# Patient Record
Sex: Female | Born: 1935 | Race: White | Hispanic: No | Marital: Married | Smoking: Former smoker
Health system: Southern US, Community
[De-identification: ages and names within clinical notes are randomized; demographics above are authoritative.]

## PROBLEM LIST (undated history)

## (undated) DIAGNOSIS — G309 Alzheimer's disease, unspecified: Secondary | ICD-10-CM

## (undated) DIAGNOSIS — I10 Essential (primary) hypertension: Secondary | ICD-10-CM

## (undated) DIAGNOSIS — R519 Headache, unspecified: Secondary | ICD-10-CM

## (undated) DIAGNOSIS — F0280 Dementia in other diseases classified elsewhere without behavioral disturbance: Secondary | ICD-10-CM

## (undated) DIAGNOSIS — F419 Anxiety disorder, unspecified: Secondary | ICD-10-CM

## (undated) DIAGNOSIS — R51 Headache: Secondary | ICD-10-CM

## (undated) HISTORY — PX: BREAST BIOPSY: SHX20

## (undated) HISTORY — DX: Anxiety disorder, unspecified: F41.9

## (undated) HISTORY — DX: Essential (primary) hypertension: I10

## (undated) HISTORY — DX: Headache, unspecified: R51.9

## (undated) HISTORY — DX: Headache: R51

## (undated) HISTORY — DX: Dementia in other diseases classified elsewhere, unspecified severity, without behavioral disturbance, psychotic disturbance, mood disturbance, and anxiety: F02.80

## (undated) HISTORY — DX: Alzheimer's disease, unspecified: G30.9

---

## 1998-10-18 ENCOUNTER — Other Ambulatory Visit: Admission: RE | Admit: 1998-10-18 | Discharge: 1998-10-18 | Payer: Self-pay | Admitting: Obstetrics & Gynecology

## 1999-10-25 ENCOUNTER — Other Ambulatory Visit: Admission: RE | Admit: 1999-10-25 | Discharge: 1999-10-25 | Payer: Self-pay | Admitting: Obstetrics and Gynecology

## 1999-11-08 ENCOUNTER — Other Ambulatory Visit: Admission: RE | Admit: 1999-11-08 | Discharge: 1999-11-08 | Payer: Self-pay | Admitting: Obstetrics and Gynecology

## 1999-11-08 ENCOUNTER — Encounter (INDEPENDENT_AMBULATORY_CARE_PROVIDER_SITE_OTHER): Payer: Self-pay

## 2000-12-17 ENCOUNTER — Other Ambulatory Visit: Admission: RE | Admit: 2000-12-17 | Discharge: 2000-12-17 | Payer: Self-pay | Admitting: Obstetrics and Gynecology

## 2002-02-03 ENCOUNTER — Other Ambulatory Visit: Admission: RE | Admit: 2002-02-03 | Discharge: 2002-02-03 | Payer: Self-pay | Admitting: Obstetrics and Gynecology

## 2003-08-10 ENCOUNTER — Other Ambulatory Visit: Admission: RE | Admit: 2003-08-10 | Discharge: 2003-08-10 | Payer: Self-pay | Admitting: Obstetrics and Gynecology

## 2005-01-23 ENCOUNTER — Other Ambulatory Visit: Admission: RE | Admit: 2005-01-23 | Discharge: 2005-01-23 | Payer: Self-pay | Admitting: Obstetrics and Gynecology

## 2005-06-06 ENCOUNTER — Ambulatory Visit: Payer: Self-pay | Admitting: Internal Medicine

## 2005-06-16 ENCOUNTER — Ambulatory Visit: Payer: Self-pay | Admitting: Internal Medicine

## 2007-02-04 ENCOUNTER — Encounter: Admission: RE | Admit: 2007-02-04 | Discharge: 2007-02-04 | Payer: Self-pay | Admitting: Family Medicine

## 2010-05-08 ENCOUNTER — Encounter: Payer: Self-pay | Admitting: Internal Medicine

## 2010-09-03 NOTE — Letter (Signed)
Summary: Colonoscopy Letter  [facility]  [address] [place], [place] [address]   Phone: [phone]  Fax: [phone]      May 08, 2010 MRN: [id number]   [facility] [address] [place], [place]  [address]   Dear Ms. Fredericksen,   According to your medical record, it is time for you to schedule a Colonoscopy. The American Cancer Society recommends this procedure as a method to detect early colon cancer. Patients with a family history of colon cancer, or a personal history of colon polyps or inflammatory bowel disease are at increased risk.  This letter has been generated based on the recommendations made at the time of your procedure. If you feel that in your particular situation this may no longer apply, please contact our office.  Please call our office at [phone] to schedule this appointment or to update your records at your earliest convenience.  Thank you for cooperating with [place] to provide you with the very best care possible.   Sincerely,  Wilhemina Bonito. Marina Goodell, M.D.  [facility] [phone]

## 2010-09-05 ENCOUNTER — Encounter (INDEPENDENT_AMBULATORY_CARE_PROVIDER_SITE_OTHER): Payer: Self-pay | Admitting: *Deleted

## 2010-09-11 NOTE — Letter (Signed)
Summary: Pre Visit Letter Revised  [facility]  [address] [place], [place] [address]   Phone: [phone]  Fax: [phone]        09/05/2010 MRN: [id number] [facility] [address] [place], [place]  [address]             Procedure Date:  October 07, 2010   recall col-Dr Justin Mend to the Gastroenterology Division at [facility].    You are scheduled to see a nurse for your pre-procedure visit on September 23, 2010 at 10:30am on the 3rd floor at [facility], [address]  We ask that you try to arrive at our office 15 minutes prior to your appointment time to allow for check-in.  Please take a minute to review the attached form.  If you answer "Yes" to one or more of the questions on the first page, we ask that you call the person listed at your earliest opportunity.  If you answer "No" to all of the questions, please complete the rest of the form and bring it to your appointment.    Your nurse visit will consist of discussing your medical and surgical history, your immediate family medical history, and your medications.   If you are unable to list all of your medications on the form, please bring the medication bottles to your appointment and we will list them.  We will need to be aware of both prescribed and over the counter drugs.  We will need to know exact dosage information as well.    Please be prepared to read and sign documents such as consent forms, a financial agreement, and acknowledgement forms.  If necessary, and with your consent, a friend or relative is welcome to sit-in on the nurse visit with you.  Please bring your insurance card so that we may make a copy of it.  If your insurance requires a referral to see a specialist, please bring your referral form from your primary care physician.  No co-pay is required for this nurse visit.     If you cannot keep your appointment, please call [phone] to cancel or reschedule prior  to your appointment date.  This allows [place] the opportunity to schedule an appointment for another patient in need of care.    Thank you for choosing [facility] for your medical needs.  We appreciate the opportunity to care for you.  Please visit [place] at our website  to learn more about our practice.  Sincerely, The Gastroenterology Division

## 2010-09-19 ENCOUNTER — Encounter (INDEPENDENT_AMBULATORY_CARE_PROVIDER_SITE_OTHER): Payer: Self-pay | Admitting: *Deleted

## 2010-09-23 ENCOUNTER — Encounter: Payer: Self-pay | Admitting: Internal Medicine

## 2010-10-01 NOTE — Miscellaneous (Signed)
Summary: [facility] Previsit/prep  Clinical Lists Changes  Medications: Added new medication of MOVIPREP 100 GM  SOLR (PEG-KCL-NACL-NASULF-NA ASC-C) As per prep instructions. - Signed Rx of MOVIPREP 100 GM  SOLR (PEG-KCL-NACL-NASULF-NA ASC-C) As per prep instructions.;  #1 x 0;  Signed;  Entered by: Wyona Almas RN;  Authorized by: Hilarie Fredrickson MD;  Method used: [facility] to [facility]  [phone]*, [address] [place], [place]  [address], Ph: [phone] or [phone], Fax: [phone] Allergies: Added new allergy or adverse reaction of SULFA Observations: Added new observation of NKA: F (09/23/2010 10:34)    Prescriptions: MOVIPREP 100 GM  SOLR (PEG-KCL-NACL-NASULF-NA ASC-C) As per prep instructions.  #1 x 0   Entered by:   Wyona Almas RN   Authorized by:   Hilarie Fredrickson MD   Signed by:   Wyona Almas RN on 09/23/2010   Method used:   [facility]ronically to        [facility]  [phone]* (retail)       [address] [place], [place]  [address]       Ph: [phone] or [phone]       Fax: [phone]   RxID:   [id number]

## 2010-10-01 NOTE — Letter (Signed)
Summary: Moviprep Instructions  [facility]  [address]   [place], [place] [address]   Phone: [phone]  Fax: [phone]       Kathryn [name]    12/29/1933    MRN: [id number]        Procedure Day /Date: Monday, 10-07-10     Arrival Time: 8:30 a.m.     Procedure Time: 9:30 a.m.     Location of Procedure:                    x  [facility] (4th Floor)                        PREPARATION FOR COLONOSCOPY WITH MOVIPREP   Starting 5 days prior to your procedure 10-02-10 do not eat nuts, seeds, popcorn, corn, beans, peas,  salads, or any raw vegetables.  Do not take any fiber supplements (e.g. Metamucil, Citrucel, and Benefiber).  THE DAY BEFORE YOUR PROCEDURE         DATE: 10-06-10  DAY: Sunday  1.  Drink clear liquids the entire day-NO SOLID FOOD  2.  Do not drink anything colored red or purple.  Avoid juices with pulp.  No orange juice.  3.  Drink at least 64 oz. (8 glasses) of fluid/clear liquids during the day to prevent dehydration and help the prep work efficiently.  CLEAR LIQUIDS INCLUDE: Water Jello Ice Popsicles Tea (sugar ok, no milk/cream) Powdered fruit flavored drinks Coffee (sugar ok, no milk/cream) Gatorade Juice: apple, white grape, white cranberry  Lemonade Clear bullion, consomm, broth Carbonated beverages (any kind) Strained chicken noodle soup Hard Candy                             4.  In the morning, mix first dose of MoviPrep solution:    Empty 1 Pouch A and 1 Pouch B into the disposable container    Add lukewarm drinking water to the top line of the container. Mix to dissolve    Refrigerate (mixed solution should be used within 24 hrs)  5.  Begin drinking the prep at 5:00 p.m. The MoviPrep container is divided by 4 marks.   Every 15 minutes drink the solution down to the next mark (approximately 8 oz) until the full liter is complete.   6.  Follow completed prep with 16 oz of clear liquid of your choice (Nothing  red or purple).  Continue to drink clear liquids until bedtime.  7.  Before going to bed, mix second dose of MoviPrep solution:    Empty 1 Pouch A and 1 Pouch B into the disposable container    Add lukewarm drinking water to the top line of the container. Mix to dissolve    Refrigerate  THE DAY OF YOUR PROCEDURE      DATE: 10-07-10  DAY: Monday  Beginning at 4:30 a.m. (5 hours before procedure):         1. Every 15 minutes, drink the solution down to the next mark (approx 8 oz) until the full liter is complete.  2. Follow completed prep with 16 oz. of clear liquid of your choice.    3. You may drink clear liquids until 7:30 a.m. (2 HOURS BEFORE PROCEDURE).   MEDICATION INSTRUCTIONS  Unless otherwise instructed, you should take regular prescription medications with a small sip of water   as early as possible the morning of  your procedure.      OTHER INSTRUCTIONS  You will need a responsible adult at least 75 years of age to accompany you and drive you home.   This person must remain in the waiting room during your procedure.  Wear loose fitting clothing that is easily removed.  Leave jewelry and other valuables at home.  However, you may wish to bring a book to read or  an iPod/MP3 player to listen to music as you wait for your procedure to start.  Remove all body piercing jewelry and leave at home.  Total time from sign-in until discharge is approximately 2-3 hours.  You should go home directly after your procedure and rest.  You can resume normal activities the  day after your procedure.  The day of your procedure you should not:   Drive   Make legal decisions   Operate machinery   Drink alcohol   Return to work  You will receive specific instructions about eating, activities and medications before you leave.    The above instructions have been reviewed and explained to me by   Wyona Almas RN  September 23, 2010 11:02 AM     I fully understand and  can verbalize these instructions _____________________________ Date _________

## 2010-10-07 ENCOUNTER — Other Ambulatory Visit: Payer: Self-pay | Admitting: Internal Medicine

## 2010-10-07 ENCOUNTER — Other Ambulatory Visit (AMBULATORY_SURGERY_CENTER): Payer: Medicare Other | Admitting: Internal Medicine

## 2010-10-07 DIAGNOSIS — Z860100 Personal history of colon polyps, unspecified: Secondary | ICD-10-CM

## 2010-10-07 DIAGNOSIS — D126 Benign neoplasm of colon, unspecified: Secondary | ICD-10-CM

## 2010-10-07 DIAGNOSIS — Z1211 Encounter for screening for malignant neoplasm of colon: Secondary | ICD-10-CM

## 2010-10-07 DIAGNOSIS — K5730 Diverticulosis of large intestine without perforation or abscess without bleeding: Secondary | ICD-10-CM

## 2010-10-10 ENCOUNTER — Encounter: Payer: Self-pay | Admitting: Internal Medicine

## 2010-10-15 NOTE — Letter (Signed)
Summary: Patient Notice- Polyp Results  [facility]  [address] [place], [place] [address]   Phone: [phone]  Fax: [phone]        October 10, 2010 MRN: [id number]    [facility] [address] [place], [place]  [address]    Dear Ms. Rote,  I am pleased to inform you that the colon polyp(s) removed during your recent colonoscopy was (were) found to be benign (no cancer detected) upon pathologic examination.   Additional information/recommendations:  __ No further action with gastroenterology is needed at this time. Please      follow-up with your primary care physician for your other healthcare      needs.    Please call us if you are having persistent problems or have questions about your condition that have not been fully answered at this time.  Sincerely,  Hilarie Fredrickson MD  This letter has been electronically signed by your physician.  Appended Document: Patient Notice- Polyp Results letter mailed

## 2010-10-15 NOTE — Procedures (Addendum)
Summary: Colonoscopy  Patient: [name] Note: All result statuses are Final unless otherwise noted.  Tests: (1) Colonoscopy (COL)   COL Colonoscopy           DONE     [facility]     [address]     [place], [place]  [address]          COLONOSCOPY PROCEDURE REPORT          PATIENT:  Kathryn [name], Kathryn [name]  MR#:  [id number]     BIRTHDATE:  1934-06-20, 76 yrs. old  GENDER:  female     ENDOSCOPIST:  Wilhemina Bonito. Eda Keys, MD     REF. BY:  Surveillance Program Recall,     PROCEDURE DATE:  10/07/2010     PROCEDURE:  Colonoscopy with snare polypectomy x 1     ASA CLASS:  Class I     INDICATIONS:  history of polyps, surveillance and high-risk     screening ; index exam 06-2005 w/ adenomatous appearring polyp     (NAP)     MEDICATIONS:   Fentanyl 75 mcg IV, Versed 6 mg IV          DESCRIPTION OF PROCEDURE:   After the risks benefits and     alternatives of the procedure were thoroughly explained, informed     consent was obtained.  Digital rectal exam was performed and     revealed no abnormalities.   The LB CF-[id number] endoscope     was introduced through the anus and advanced to the cecum, which     was identified by both the appendix and ileocecal valve, without     limitations.Time to cecum = 4:00 min. The quality of the prep was     good, using MoviPrep.  The instrument was then slowly withdrawn     (time = 11:46 min) as the colon was fully examined.     <<PROCEDUREIMAGES>>          FINDINGS:  A diminutive polyp was found in the ascending colon.     Polyp was snared without cautery. Retrieval was successful.A few     Scattered diverticula were found.  Otherwise normal colonoscopy     without other polyps, masses, vascular ectasias, or inflammatory     changes.   Retroflexed views in the rectum revealed no     abnormalities.    The scope was then withdrawn from the patient     and the procedure completed.          COMPLICATIONS:  None     ENDOSCOPIC  IMPRESSION:     1) Diminutive polyp in the ascending colon - removed     2) Diverticula, a few scattered     3) Otherwise normal colonoscopy          RECOMMENDATIONS:     1) Return to the care of your primary provider. GI follow up as     needed          ______________________________     Wilhemina Bonito. Eda Keys, MD          CC:  Huel Cote, MD; The Patient          n.     eSIGNED:   Wilhemina Bonito. Eda Keys at 10/07/2010 10:09 AM          Liborio Nixon, [id number]  Note: An exclamation mark (!) indicates a result that was not dispersed into the flowsheet. Document Creation Date:  10/07/2010 10:09 AM _______________________________________________________________________  (1) Order result status: Final Collection or observation date-time: 10/07/2010 10:03 Requested date-time:  Receipt date-time:  Reported date-time:  Referring Physician:   Ordering Physician: Fransico Setters [phone]) Specimen Source:  Source: Launa Grill Order Number: [id number] Lab site:

## 2013-12-13 ENCOUNTER — Ambulatory Visit (INDEPENDENT_AMBULATORY_CARE_PROVIDER_SITE_OTHER): Payer: Medicare Other | Admitting: Family

## 2013-12-13 ENCOUNTER — Encounter: Payer: Self-pay | Admitting: Family

## 2013-12-13 VITALS — BP 128/60 | HR 88 | Temp 99.6°F | Ht 62.25 in | Wt 110.0 lb

## 2013-12-13 DIAGNOSIS — K5900 Constipation, unspecified: Secondary | ICD-10-CM

## 2013-12-13 NOTE — Patient Instructions (Signed)

## 2013-12-13 NOTE — Progress Notes (Signed)
Pre visit review using our clinic review tool, if applicable. No additional management support is needed unless otherwise documented below in the visit note. 

## 2013-12-13 NOTE — Progress Notes (Signed)
   Subjective:    Patient ID: Kathryn [name], female    DOB: 02/16/1934, 80 y.o.   MRN: [id number]  HPI  80 year old caucasian female, nonsmoker presenting today to be established. She has no health and does not take any medications  She is concerned at times that she does not have incomplete bowel movements.  She has noticed that sweet potatoes and prunes make her go but on other days she only goes a little. Denies any blood in her stools or dark black stool.     Review of Systems  Constitutional: Negative.   HENT: Negative.   Eyes: Negative.   Respiratory: Negative.   Cardiovascular: Negative.   Gastrointestinal: Positive for constipation.  Endocrine: Negative.   Genitourinary: Negative.   Musculoskeletal: Negative.   Skin: Negative.   Allergic/Immunologic: Negative.   Neurological: Negative.   Hematological: Negative.   Psychiatric/Behavioral: Negative.    History reviewed. No pertinent past medical history.  History   Social History  . Marital Status: Married    Spouse Name: N/A    Number of Children: N/A  . Years of Education: N/A   Occupational History  . Not on file.   Social History Main Topics  . Smoking status: Former Research scientist (life sciences)  . Smokeless tobacco: Not on file  . Alcohol Use: Yes     Comment: wine  . Drug Use: No  . Sexual Activity: Not on file   Other Topics Concern  . Not on file   Social History Narrative  . No narrative on file    Past Surgical History  Procedure Laterality Date  . Breast biopsy      No family history on file.  Allergies  Allergen Reactions  . Sulfonamide Derivatives     REACTION: Swollen joints    No current outpatient prescriptions on file prior to visit.   No current facility-administered medications on file prior to visit.    BP 128/60  Pulse 88  Temp(Src) 99.6 F (37.6 C) (Oral)  Ht 5' 2.25" (1.581 m)  Wt 110 lb (49.896 kg)  BMI 19.96 kg/m2  SpO2 99%chart    Objective:   Physical Exam  Constitutional:  She is oriented to person, place, and time. She appears well-developed and well-nourished.  HENT:  Head: Normocephalic and atraumatic.  Right Ear: External ear normal.  Left Ear: External ear normal.  Nose: Nose normal.  Mouth/Throat: Oropharynx is clear and moist.  Eyes: Conjunctivae and EOM are normal. Pupils are equal, round, and reactive to light.  Neck: Normal range of motion. Neck supple.  Cardiovascular: Normal rate, regular rhythm and normal heart sounds.   Pulmonary/Chest: Effort normal and breath sounds normal.  Abdominal: Soft. Bowel sounds are normal.  Musculoskeletal: Normal range of motion.  Neurological: She is alert and oriented to person, place, and time. She has normal reflexes.  Skin: Skin is warm and dry.  Psychiatric: She has a normal mood and affect.          Assessment & Plan:  Assessment 1. Constipation  Plan 1. Increase fiber. 2. Schedule physical in two months. 3. Contact office for questions and concerns.

## 2013-12-14 ENCOUNTER — Encounter: Payer: Self-pay | Admitting: Family

## 2014-03-15 ENCOUNTER — Ambulatory Visit (INDEPENDENT_AMBULATORY_CARE_PROVIDER_SITE_OTHER): Payer: Medicare Other | Admitting: Family

## 2014-03-15 ENCOUNTER — Encounter: Payer: Self-pay | Admitting: Family

## 2014-03-15 VITALS — BP 122/82 | HR 92 | Ht 62.5 in | Wt 111.0 lb

## 2014-03-15 DIAGNOSIS — Z23 Encounter for immunization: Secondary | ICD-10-CM

## 2014-03-15 DIAGNOSIS — Z136 Encounter for screening for cardiovascular disorders: Secondary | ICD-10-CM

## 2014-03-15 DIAGNOSIS — Z0000 Encounter for general adult medical examination without abnormal findings: Secondary | ICD-10-CM

## 2014-03-15 LAB — POCT URINALYSIS DIPSTICK
BILIRUBIN UA: NEGATIVE
Glucose, UA: NEGATIVE
KETONES UA: NEGATIVE
LEUKOCYTES UA: NEGATIVE
Nitrite, UA: NEGATIVE
PH UA: 7
PROTEIN UA: NEGATIVE
RBC UA: NEGATIVE
SPEC GRAV UA: 1.01
Urobilinogen, UA: 0.2

## 2014-03-15 LAB — CBC WITH DIFFERENTIAL/PLATELET
BASOS PCT: 0.5 % (ref 0.0–3.0)
Basophils Absolute: 0 10*3/uL (ref 0.0–0.1)
EOS ABS: 0.3 10*3/uL (ref 0.0–0.7)
Eosinophils Relative: 4.6 % (ref 0.0–5.0)
HCT: 41.8 % (ref 36.0–46.0)
Hemoglobin: 14.2 g/dL (ref 12.0–15.0)
LYMPHS PCT: 27.1 % (ref 12.0–46.0)
Lymphs Abs: 1.6 10*3/uL (ref 0.7–4.0)
MCHC: 34 g/dL (ref 30.0–36.0)
MCV: 94.9 fl (ref 78.0–100.0)
MONO ABS: 0.4 10*3/uL (ref 0.1–1.0)
Monocytes Relative: 7.3 % (ref 3.0–12.0)
NEUTROS PCT: 60.5 % (ref 43.0–77.0)
Neutro Abs: 3.6 10*3/uL (ref 1.4–7.7)
PLATELETS: 288 10*3/uL (ref 150.0–400.0)
RBC: 4.41 Mil/uL (ref 3.87–5.11)
RDW: 13.1 % (ref 11.5–15.5)
WBC: 5.9 10*3/uL (ref 4.0–10.5)

## 2014-03-15 LAB — BASIC METABOLIC PANEL
BUN: 18 mg/dL (ref 6–23)
CHLORIDE: 104 meq/L (ref 96–112)
CO2: 31 mEq/L (ref 19–32)
Calcium: 9.3 mg/dL (ref 8.4–10.5)
Creatinine, Ser: 1 mg/dL (ref 0.4–1.2)
GFR: 57.66 mL/min — AB (ref >60.00)
Glucose, Bld: 88 mg/dL (ref 70–99)
POTASSIUM: 3.9 meq/L (ref 3.5–5.1)
SODIUM: 141 meq/L (ref 135–145)

## 2014-03-15 LAB — HEPATIC FUNCTION PANEL
ALK PHOS: 52 U/L (ref 39–117)
ALT: 11 U/L (ref 0–35)
AST: 16 U/L (ref 0–37)
Albumin: 4.2 g/dL (ref 3.5–5.2)
BILIRUBIN DIRECT: 0.2 mg/dL (ref 0.0–0.3)
BILIRUBIN TOTAL: 1.6 mg/dL — AB (ref 0.2–1.2)
Total Protein: 6.8 g/dL (ref 6.0–8.3)

## 2014-03-15 LAB — LIPID PANEL
CHOL/HDL RATIO: 2
Cholesterol: 212 mg/dL — ABNORMAL HIGH (ref 0–200)
HDL: 87.8 mg/dL (ref >39.00)
LDL CALC: 112 mg/dL — AB (ref 0–99)
NONHDL: 124.2
Triglycerides: 63 mg/dL (ref 0.0–149.0)
VLDL: 12.6 mg/dL (ref 0.0–40.0)

## 2014-03-15 NOTE — Progress Notes (Signed)
Subjective:    Patient ID: Kathryn [name], female    DOB: [birth year], 80 y.o.   MRN: [id number]  HPI 80 year old white female, nonsmoker, is in today for a CPX.  I reviewed all health maintenance protocols including mammography, colonoscopy, bone density Needed referrals were placed. Age and diagnosis  appropriate screening labs were ordered. Her immunization history was reviewed and appropriate vaccinations were ordered. Her current medications and allergies were reviewed and needed refills of her chronic medications were ordered. The plan for yearly health maintenance was discussed all orders and referrals were made as appropriate.   Review of Systems  Constitutional: Negative.   HENT: Negative.   Eyes: Negative.   Respiratory: Negative.   Cardiovascular: Negative.   Gastrointestinal: Negative.   Endocrine: Negative.   Genitourinary: Negative.   Musculoskeletal: Negative.   Skin: Negative.   Allergic/Immunologic: Negative.   Neurological: Negative.   Hematological: Negative.   Psychiatric/Behavioral: Negative.    History reviewed. No pertinent past medical history.  History   Social History  . Marital Status: Married    Spouse Name: N/A    Number of Children: N/A  . Years of Education: N/A   Occupational History  . Not on file.   Social History Main Topics  . Smoking status: Former Research scientist (life sciences)  . Smokeless tobacco: Not on file  . Alcohol Use: Yes     Comment: wine  . Drug Use: No  . Sexual Activity: Not on file   Other Topics Concern  . Not on file   Social History Narrative  . No narrative on file    Past Surgical History  Procedure Laterality Date  . Breast biopsy      No family history on file.  Allergies  Allergen Reactions  . Sulfonamide Derivatives     REACTION: Swollen joints    Current Outpatient Prescriptions on File Prior to Visit  Medication Sig Dispense Refill  . BIOTIN 5000 PO Take by mouth.      . calcium carbonate (OS-CAL) 600 MG TABS  tablet Take 600 mg by mouth daily.      . Cholecalciferol (VITAMIN D3) 2000 UNITS TABS Take by mouth.      . Coenzyme Q10 (COQ10 PO) Take by mouth.      . cyanocobalamin 100 MCG tablet Take 100 mcg by mouth daily.      . folic acid (FOLVITE) 948 MCG tablet Take 400 mcg by mouth daily.      . Grape Seed Extract 100 MG CAPS Take by mouth.      . magnesium gluconate (MAGONATE) 500 MG tablet Take 500 mg by mouth daily.      . NON FORMULARY MK-7 90 meq      . Omega-3 Fatty Acids (FISH OIL) 1000 MG CAPS Take by mouth.      . Potassium Gluconate 550 MG TABS Take by mouth.      . pyridoxine (B-6) 100 MG tablet Take 100 mg by mouth daily.      Marland Kitchen zinc gluconate 50 MG tablet Take 50 mg by mouth daily.       No current facility-administered medications on file prior to visit.    BP 122/82  Pulse 92  Ht 5' 2.5" (1.588 m)  Wt 111 lb (50.349 kg)  BMI 19.97 kg/m2chart    Objective:   Physical Exam  Constitutional: She is oriented to person, place, and time. She appears well-developed and well-nourished.  HENT:  Head: Normocephalic and atraumatic.  Right Ear: External ear normal.  Left Ear: External ear normal.  Nose: Nose normal.  Mouth/Throat: Oropharynx is clear and moist.  Eyes: Conjunctivae and EOM are normal. Pupils are equal, round, and reactive to light.  Neck: Normal range of motion. Neck supple. No thyromegaly present.  Cardiovascular: Normal rate, regular rhythm and normal heart sounds.   Pulmonary/Chest: Effort normal and breath sounds normal.  Abdominal: Soft. Bowel sounds are normal.  Musculoskeletal: Normal range of motion. She exhibits no edema and no tenderness.  Neurological: She is alert and oriented to person, place, and time. She has normal reflexes. She displays normal reflexes. No cranial nerve deficit. Coordination normal.  Skin: Skin is warm and dry.  Psychiatric: She has a normal mood and affect.          Assessment & Plan:  [name] was seen today for annual  exam.  Diagnoses and associated orders for this visit:  Preventative health care - CBC with Differential - Basic Metabolic Panel - Hepatic Function Panel - POCT urinalysis dipstick - Lipid Panel - EKG 12-Lead  Need for prophylactic vaccination with tetanus toxoid alone - Td vaccine preservative free greater than or equal to 7yo IM  Need for prophylactic vaccination against Streptococcus pneumoniae (pneumococcus) - Pneumococcal conjugate vaccine 13-valent   Call the office with any questions or concerns. Recheck in 1 year and sooner as needed.

## 2014-03-15 NOTE — Progress Notes (Signed)
Pre visit review using our clinic review tool, if applicable. No additional management support is needed unless otherwise documented below in the visit note. 

## 2014-03-15 NOTE — Patient Instructions (Signed)
Cardiac Diet This diet can help prevent heart disease and stroke. Many factors influence your heart health, including eating and exercise habits. Coronary risk rises a lot with abnormal blood fat (lipid) levels. Cardiac meal planning includes limiting unhealthy fats, increasing healthy fats, and making other small dietary changes. General guidelines are as follows:  Adjust calorie intake to reach and maintain desirable body weight.  Limit total fat intake to less than 30% of total calories. Saturated fat should be less than 7% of calories.  Saturated fats are found in animal products and in some vegetable products. Saturated vegetable fats are found in coconut oil, cocoa butter, palm oil, and palm kernel oil. Read labels carefully to avoid these products as much as possible. Use butter in moderation. Choose tub margarines and oils that have 2 grams of fat or less. Good cooking oils are canola and olive oils.  Practice low-fat cooking techniques. Do not fry food. Instead, broil, bake, boil, steam, grill, roast on a rack, stir-fry, or microwave it. Other fat reducing suggestions include:  Remove the skin from poultry.  Remove all visible fat from meats.  Skim the fat off stews, soups, and gravies before serving them.  Steam vegetables in water or broth instead of sauting them in fat.  Avoid foods with trans fat (or hydrogenated oils), such as commercially fried foods and commercially baked goods. Commercial shortening and deep-frying fats will contain trans fat.  Increase intake of fruits, vegetables, whole grains, and legumes to replace foods high in fat.  Increase consumption of nuts, legumes, and seeds to at least 4 servings weekly. One serving of a legume equals  cup, and 1 serving of nuts or seeds equals  cup.  Choose whole grains more often. Have 3 servings per day (a serving is 1 ounce [oz]).  Eat 4 to 5 servings of vegetables per day. A serving of vegetables is 1 cup of raw leafy  vegetables;  cup of raw or cooked cut-up vegetables;  cup of vegetable juice.  Eat 4 to 5 servings of fruit per day. A serving of fruit is 1 medium whole fruit;  cup of dried fruit;  cup of fresh, frozen, or canned fruit;  cup of 100% fruit juice.  Increase your intake of dietary fiber to 20 to 30 grams per day. Insoluble fiber may help lower your risk of heart disease and may help curb your appetite.  Soluble fiber binds cholesterol to be removed from the blood. Foods high in soluble fiber are dried beans, citrus fruits, oats, apples, bananas, broccoli, Brussels sprouts, and eggplant.  Try to include foods fortified with plant sterols or stanols, such as yogurt, breads, juices, or margarines. Choose several fortified foods to achieve a daily intake of 2 to 3 grams of plant sterols or stanols.  Foods with omega-3 fats can help reduce your risk of heart disease. Aim to have a 3.5 oz portion of fatty fish twice per week, such as salmon, mackerel, albacore tuna, sardines, lake trout, or herring. If you wish to take a fish oil supplement, choose one that contains 1 gram of both DHA and EPA.  Limit processed meats to 2 servings (3 oz portion) weekly.  Limit the sodium in your diet to 1500 milligrams (mg) per day. If you have high blood pressure, talk to a registered dietitian about a DASH (Dietary Approaches to Stop Hypertension) eating plan.  Limit sweets and beverages with added sugar, such as soda, to no more than 5 servings per week. One   serving is:   1 tablespoon sugar.  1 tablespoon jelly or jam.   cup sorbet.  1 cup lemonade.   cup regular soda. CHOOSING FOODS Starches  Allowed: Breads: All kinds (wheat, rye, raisin, white, oatmeal, Italian, French, and English muffin bread). Low-fat rolls: English muffins, frankfurter and hamburger buns, bagels, pita bread, tortillas (not fried). Pancakes, waffles, biscuits, and muffins made with recommended oil.  Avoid: Products made with  saturated or trans fats, oils, or whole milk products. Butter rolls, cheese breads, croissants. Commercial doughnuts, muffins, sweet rolls, biscuits, waffles, pancakes, store-bought mixes. Crackers  Allowed: Low-fat crackers and snacks: Animal, graham, rye, saltine (with recommended oil, no lard), oyster, and matzo crackers. Bread sticks, melba toast, rusks, flatbread, pretzels, and light popcorn.  Avoid: High-fat crackers: cheese crackers, butter crackers, and those made with coconut, palm oil, or trans fat (hydrogenated oils). Buttered popcorn. Cereals  Allowed: Hot or cold whole-grain cereals.  Avoid: Cereals containing coconut, hydrogenated vegetable fat, or animal fat. Potatoes / Pasta / Rice  Allowed: All kinds of potatoes, rice, and pasta (such as macaroni, spaghetti, and noodles).  Avoid: Pasta or rice prepared with cream sauce or high-fat cheese. Chow mein noodles, French fries. Vegetables  Allowed: All vegetables and vegetable juices.  Avoid: Fried vegetables. Vegetables in cream, butter, or high-fat cheese sauces. Limit coconut. Fruit in cream or custard. Protein  Allowed: Limit your intake of meat, seafood, and poultry to no more than 6 oz (cooked weight) per day. All lean, well-trimmed beef, veal, pork, and lamb. All chicken and turkey without skin. All fish and shellfish. Wild game: wild duck, rabbit, pheasant, and venison. Egg whites or low-cholesterol egg substitutes may be used as desired. Meatless dishes: recipes with dried beans, peas, lentils, and tofu (soybean curd). Seeds and nuts: all seeds and most nuts.  Avoid: Prime grade and other heavily marbled and fatty meats, such as short ribs, spare ribs, rib eye roast or steak, frankfurters, sausage, bacon, and high-fat luncheon meats, mutton. Caviar. Commercially fried fish. Domestic duck, goose, venison sausage. Organ meats: liver, gizzard, heart, chitterlings, brains, kidney, sweetbreads. Dairy  Allowed: Low-fat  cheeses: nonfat or low-fat cottage cheese (1% or 2% fat), cheeses made with part skim milk, such as mozzarella, farmers, string, or ricotta. (Cheeses should be labeled no more than 2 to 6 grams fat per oz.). Skim (or 1%) milk: liquid, powdered, or evaporated. Buttermilk made with low-fat milk. Drinks made with skim or low-fat milk or cocoa. Chocolate milk or cocoa made with skim or low-fat (1%) milk. Nonfat or low-fat yogurt.  Avoid: Whole milk cheeses, including colby, cheddar, muenster, Monterey Jack, Havarti, Brie, Camembert, American, Swiss, and blue. Creamed cottage cheese, cream cheese. Whole milk and whole milk products, including buttermilk or yogurt made from whole milk, drinks made from whole milk. Condensed milk, evaporated whole milk, and 2% milk. Soups and Combination Foods  Allowed: Low-fat low-sodium soups: broth, dehydrated soups, homemade broth, soups with the fat removed, homemade cream soups made with skim or low-fat milk. Low-fat spaghetti, lasagna, chili, and Spanish rice if low-fat ingredients and low-fat cooking techniques are used.  Avoid: Cream soups made with whole milk, cream, or high-fat cheese. All other soups. Desserts and Sweets  Allowed: Sherbet, fruit ices, gelatins, meringues, and angel food cake. Homemade desserts with recommended fats, oils, and milk products. Jam, jelly, honey, marmalade, sugars, and syrups. Pure sugar candy, such as gum drops, hard candy, jelly beans, marshmallows, mints, and small amounts of dark chocolate.  Avoid: Commercially prepared   cakes, pies, cookies, frosting, pudding, or mixes for these products. Desserts containing whole milk products, chocolate, coconut, lard, palm oil, or palm kernel oil. Ice cream or ice cream drinks. Candy that contains chocolate, coconut, butter, hydrogenated fat, or unknown ingredients. Buttered syrups. Fats and Oils  Allowed: Vegetable oils: safflower, sunflower, corn, soybean, cottonseed, sesame, canola, olive,  or peanut. Non-hydrogenated margarines. Salad dressing or mayonnaise: homemade or commercial, made with a recommended oil. Low or nonfat salad dressing or mayonnaise.  Limit added fats and oils to 6 to 8 tsp per day (includes fats used in cooking, baking, salads, and spreads on bread). Remember to count the "hidden fats" in foods.  Avoid: Solid fats and shortenings: butter, lard, salt pork, bacon drippings. Gravy containing meat fat, shortening, or suet. Cocoa butter, coconut. Coconut oil, palm oil, palm kernel oil, or hydrogenated oils: these ingredients are often used in bakery products, nondairy creamers, whipped toppings, candy, and commercially fried foods. Read labels carefully. Salad dressings made of unknown oils, sour cream, or cheese, such as blue cheese and Roquefort. Cream, all kinds: half-and-half, light, heavy, or whipping. Sour cream or cream cheese (even if "light" or low-fat). Nondairy cream substitutes: coffee creamers and sour cream substitutes made with palm, palm kernel, hydrogenated oils, or coconut oil. Beverages  Allowed: Coffee (regular or decaffeinated), tea. Diet carbonated beverages, mineral water. Alcohol: Check with your caregiver. Moderation is recommended.  Avoid: Whole milk, regular sodas, and juice drinks with added sugar. Condiments  Allowed: All seasonings and condiments. Cocoa powder. "Cream" sauces made with recommended ingredients.  Avoid: Carob powder made with hydrogenated fats. SAMPLE MENU Breakfast   cup orange juice   cup oatmeal  1 slice toast  1 tsp margarine  1 cup skim milk Lunch  Turkey sandwich with 2 oz turkey, 2 slices bread  Lettuce and tomato slices  Fresh fruit  Carrot sticks  Coffee or tea Snack  Fresh fruit or low-fat crackers Dinner  3 oz lean ground beef  1 baked potato  1 tsp margarine   cup asparagus  Lettuce salad  1 tbs non-creamy dressing   cup peach slices  1 cup skim milk Document Released:  04/29/2008 Document Revised: 01/20/2012 Document Reviewed: 09/20/2013 Exit[employer] Patient Information 2015 [employer]. This information is not intended to replace advice given to you by your health care provider. Make sure you discuss any questions you have with your health care provider.  

## 2015-01-15 DIAGNOSIS — M5414 Radiculopathy, thoracic region: Secondary | ICD-10-CM | POA: Diagnosis not present

## 2015-01-15 DIAGNOSIS — M9904 Segmental and somatic dysfunction of sacral region: Secondary | ICD-10-CM | POA: Diagnosis not present

## 2015-01-15 DIAGNOSIS — M9905 Segmental and somatic dysfunction of pelvic region: Secondary | ICD-10-CM | POA: Diagnosis not present

## 2015-01-15 DIAGNOSIS — M9903 Segmental and somatic dysfunction of lumbar region: Secondary | ICD-10-CM | POA: Diagnosis not present

## 2015-01-15 DIAGNOSIS — M5136 Other intervertebral disc degeneration, lumbar region: Secondary | ICD-10-CM | POA: Diagnosis not present

## 2015-05-28 ENCOUNTER — Encounter: Payer: Self-pay | Admitting: Neurology

## 2015-05-28 ENCOUNTER — Ambulatory Visit (INDEPENDENT_AMBULATORY_CARE_PROVIDER_SITE_OTHER): Payer: Medicare Other | Admitting: Neurology

## 2015-05-28 VITALS — BP 156/71 | HR 76 | Ht 62.5 in | Wt 116.4 lb

## 2015-05-28 DIAGNOSIS — F05 Delirium due to known physiological condition: Secondary | ICD-10-CM

## 2015-05-28 DIAGNOSIS — R413 Other amnesia: Secondary | ICD-10-CM | POA: Diagnosis not present

## 2015-05-28 DIAGNOSIS — E538 Deficiency of other specified B group vitamins: Secondary | ICD-10-CM

## 2015-05-28 DIAGNOSIS — F411 Generalized anxiety disorder: Secondary | ICD-10-CM | POA: Diagnosis not present

## 2015-05-28 DIAGNOSIS — R251 Tremor, unspecified: Secondary | ICD-10-CM | POA: Diagnosis not present

## 2015-05-28 DIAGNOSIS — I10 Essential (primary) hypertension: Secondary | ICD-10-CM | POA: Diagnosis not present

## 2015-05-28 DIAGNOSIS — R410 Disorientation, unspecified: Secondary | ICD-10-CM

## 2015-05-28 MED ORDER — DONEPEZIL HCL 10 MG PO TABS: 10.0000 mg | ORAL_TABLET | Freq: Every day | ORAL | Status: DC

## 2015-05-28 MED ORDER — DONEPEZIL HCL 5 MG PO TABS: 5.0000 mg | ORAL_TABLET | Freq: Every day | ORAL | Status: DC

## 2015-05-28 MED ORDER — MEMANTINE HCL ER 28 MG PO CP24: 28.0000 mg | ORAL_CAPSULE | Freq: Every day | ORAL | Status: DC

## 2015-05-28 NOTE — Progress Notes (Signed)
[facility]    Provider:  Dr [name] Referring Provider: Kennyth Arnold, FNP Primary Care Physician:  Kennyth Arnold, FNP  CC:  Memory changes  HPI:  JOHNANNA [name] is a 81 y.o. female here as a referral from Dr. Justin Mend for memory loss. PMHx anxiety, HTN, fatige, headache. She lives with her husband in a single-family home. Husband is here with her. She functions well at home per patient, she cooks and likes to do yard work. She watches the news dails and enjoys watching movies. Patient says she forgets things at the store. Husband noticed the memory changes which started last October/November when they were dealing with bed bugs.  Husband says directions were difficult for patient, he had to watch patient closely to make sure she followed directions. Previous to this the changes were subtle, she would forget little things. There has been a decline in memory since then. Slowly progressive. She cooks, cleans, she drives. One time the stove was left on but otherwise no accidents in the home or in the car. She doesn't drive far, she doesn't get lost. She knows where to go around her house. She can make dinner fine. One time she forgot to add an ingredient in dinner but that was just an oversight. She is sleeping well. She writes down appointments and keep appointment cards. She has forgotten one appointment a long time ago. Husband does the finances, patient used to do the finances but husband took over because patient couldn't keep up with it. She was forgetting to pay bills. No behavioral changes except sometimes she gets aggravated at herself. No hallucinations or delusions. Mother with Alzheimers at about age 107. Husband is becoming concerned with wife's driving. She goes to yoga every week. No other focal neurologic symptoms. Older memories are intact. More recent memories impaired.   Reviewed notes, labs and imaging from outside physicians, which showed: patient was referred for  evaluation of alzheimer's dementia. CMP unremarkable. TSH wnl.   Review of Systems: Patient complains of symptoms per HPI as well as the following symptoms: No fever, no chills, no CP, no sob. Pertinent negatives per HPI. All others negative.   Social History   Social History  . Marital Status: Married    Spouse Name: [name]   . Number of Children: 3  . Years of Education: 12   Occupational History  . Not on file.   Social History Main Topics  . Smoking status: Former Research scientist (life sciences)  . Smokeless tobacco: Not on file  . Alcohol Use: Yes     Comment: wine  . Drug Use: No  . Sexual Activity: Not on file   Other Topics Concern  . Not on file   Social History Narrative   Lives at home with husband   Caffeine use: Drinks coffee/tea     Family History  Problem Relation Age of Onset  . Thyroid disease Mother   . Alzheimer's disease Mother   . Dementia Mother   . Hearing loss Brother   . Hearing loss Brother   . Heart Problems Brother     Past Medical History  Diagnosis Date  . Headache     Past Surgical History  Procedure Laterality Date  . Breast biopsy      Current Outpatient Prescriptions  Medication Sig Dispense Refill  . BIOTIN 5000 PO Take by mouth.    . calcium carbonate (OS-CAL) 600 MG TABS tablet Take 600 mg by mouth daily.    . Cholecalciferol (  VITAMIN D3) 2000 UNITS TABS Take by mouth.    . Coenzyme Q10 (COQ10 PO) Take by mouth.    . cyanocobalamin 100 MCG tablet Take 100 mcg by mouth daily.    . folic acid (FOLVITE) 496 MCG tablet Take 400 mcg by mouth daily.    . Grape Seed Extract 100 MG CAPS Take by mouth.    . magnesium gluconate (MAGONATE) 500 MG tablet Take 500 mg by mouth daily.    . NON FORMULARY MK-7 90 meq    . Omega-3 Fatty Acids (FISH OIL) 1000 MG CAPS Take by mouth.    . Potassium Gluconate 550 MG TABS Take by mouth.    . pyridoxine (B-6) 100 MG tablet Take 100 mg by mouth daily.    Marland Kitchen zinc gluconate 50 MG tablet Take 50 mg by mouth daily.     Marland Kitchen donepezil (ARICEPT) 10 MG tablet Take 1 tablet (10 mg total) by mouth at bedtime. 30 tablet 11  . donepezil (ARICEPT) 5 MG tablet Take 1 tablet (5 mg total) by mouth at bedtime. 30 tablet 0  . memantine (NAMENDA XR) 28 MG CP24 24 hr capsule Take 1 capsule (28 mg total) by mouth daily. 30 capsule 11   No current facility-administered medications for this visit.    Allergies as of 05/28/2015 - Review Complete 05/28/2015  Allergen Reaction Noted  . Sulfonamide derivatives  09/23/2010    Vitals: BP 156/71 mmHg  Pulse 76  Ht 5' 2.5" (1.588 m)  Wt 116 lb 6.4 oz (52.799 kg)  BMI 20.94 kg/m2 Last Weight:  Wt Readings from Last 1 Encounters:  05/28/15 116 lb 6.4 oz (52.799 kg)   Last Height:   Ht Readings from Last 1 Encounters:  05/28/15 5' 2.5" (1.588 m)    Physical exam: Exam: Gen: NAD, conversant, well nourised, well groomed                     CV: RRR, no MRG. No Carotid Bruits. No peripheral edema, warm, nontender Eyes: Conjunctivae clear without exudates or hemorrhage  Neuro: Detailed Neurologic Exam  Speech:    Speech is normal; fluent and spontaneous with normal comprehension.  Cognition:    Montreal Cognitive Assessment  05/28/2015  Visuospatial/ Executive (0/5) 0  Naming (0/3) 3  Attention: Read list of digits (0/2) 2  Attention: Read list of letters (0/1) 0  Attention: Serial 7 subtraction starting at 100 (0/3) 0  Language: Repeat phrase (0/2) 1  Language : Fluency (0/1) 0  Abstraction (0/2) 1  Delayed Recall (0/5) 0  Orientation (0/6) 6  Total 13  Adjusted Score (based on education) 14     Cranial Nerves:    The pupils are equal, round, and reactive to light. The fundi are normal and spontaneous venous pulsations are present. Visual fields are full to finger confrontation. Extraocular movements are intact. Trigeminal sensation is intact and the muscles of mastication are normal. The face is symmetric. The palate elevates in the midline. Hearing  intact. Voice is normal. Shoulder shrug is normal. The tongue has normal motion without fasciculations.   Coordination:    Normal finger to nose and heel to shin. Normal rapid alternating movements.   Gait:    Heel-toe and tandem gait are normal.   Motor Observation:    No asymmetry, no atrophy, and no involuntary movements noted. Tone:    Normal muscle tone.    Posture:    Posture is normal. normal erect    Strength:  Strength is V/V in the upper and lower limbs.      Sensation: intact to LT     Reflex Exam:  DTR's:    Deep tendon reflexes in the upper and lower extremities are normal bilaterally.   Toes: left upgoing  Clonus:    Clonus is absent.      Assessment/Plan:  This is a very lovely 81 year old female with memory loss. MoCA 14/30. Likely Alzheimer's type dementia. Will check B12 and folate, MRI of the brain and start Aricept. Start Aricept 5mg  then switch to the 10mg  tablet. After this can start Namenda and titrate to 28mg  XR. Then can change to Namzeric (Namenda + Aricept once daily).   Sarina Ill, MD  [facility] [address] [place], [place] [address]  Phone [phone] Fax [phone]

## 2015-05-28 NOTE — Patient Instructions (Signed)
Overall you are doing fairly well but I do want to suggest a few things today:   Remember to drink plenty of fluid, eat healthy meals and do not skip any meals. Try to eat protein with a every meal and eat a healthy snack such as fruit or nuts in between meals. Try to keep a regular sleep-wake schedule and try to exercise daily, particularly in the form of walking, 20-30 minutes a day, if you can.   As far as your medications are concerned, I would like to suggest:  Start Aricept (donepezil) 5mg  daily. In 2-3 weeks can increase to 10mg  daily. In 8 weeks if no side effects from the Aricept, can start namenda as prescribed  As far as diagnostic testing: MRi brain, lab  I would like to see you back in 3 months, sooner if we need to. Please call us with any interim questions, concerns, problems, updates or refill requests.   Our phone number is [phone]. We also have an after hours call service for urgent matters and there is a physician on-call for urgent questions. For any emergencies you know to call 911 or go to the nearest emergency room

## 2015-05-29 ENCOUNTER — Telehealth: Payer: Self-pay | Admitting: Neurology

## 2015-05-29 ENCOUNTER — Encounter: Payer: Self-pay | Admitting: Neurology

## 2015-05-29 ENCOUNTER — Telehealth: Payer: Self-pay | Admitting: *Deleted

## 2015-05-29 DIAGNOSIS — I10 Essential (primary) hypertension: Secondary | ICD-10-CM | POA: Insufficient documentation

## 2015-05-29 DIAGNOSIS — F411 Generalized anxiety disorder: Secondary | ICD-10-CM | POA: Insufficient documentation

## 2015-05-29 LAB — B12 AND FOLATE PANEL
FOLATE: 9.1 ng/mL (ref >3.0)
VITAMIN B 12: 499 pg/mL (ref 211–946)

## 2015-05-29 LAB — PLEASE NOTE

## 2015-05-29 NOTE — Telephone Encounter (Signed)
Did he buy the 5mg  and 10mg  prescriptions in the same month? I think he has to wait a month to refill. Janett Billow, can you find out why please, would you mind?

## 2015-05-29 NOTE — Telephone Encounter (Signed)
Pt's husband called with a question: He has concern regarding RX Aricept 5mg  and one 10mg . Please call and advise [phone]

## 2015-05-29 NOTE — Telephone Encounter (Signed)
I called the pharmacy to see if they were getting any rejections or messages from ins.  Spoke with pharmacist, Clair Gulling.  He said the Donepezil was $2 fo5mg , and the Namenda XR was $200.  Because XR is only available in brand name, he says the cost is much higher.  He feels if we change the Rx to generic bid dosing, co-pay would reduce greatly.  I called the ins and spoke with Nita.  She looked through the patient's policy, and kindly provided me with cost for generic bid, which would be $45 per month vs brand name XR $200 per month.

## 2015-05-29 NOTE — Telephone Encounter (Signed)
-----   Message from Melvenia Beam, MD sent at 05/29/2015  1:15 PM EDT ----- Let patient know her labs were normal thanks

## 2015-05-29 NOTE — Telephone Encounter (Signed)
Called pt husband back. He stated that when he picked up Rx aricept for 5mg  it was only around 2 dollars, but the Rx aricept for 10mg  was 200 dollars. He is wondering if the Rx can be written differently so it does not cost so much. He is unclear as to why it was so much more. I told him I will clarify with Dr. [name] and call him back. He verbalized understanding.

## 2015-05-29 NOTE — Telephone Encounter (Signed)
I called and spoke to pts husband (on DPR)  and relayed the normal lab results done in our office.   He would relay to his wife.

## 2015-05-30 MED ORDER — MEMANTINE HCL 10 MG PO TABS: 10.0000 mg | ORAL_TABLET | Freq: Two times a day (BID) | ORAL | Status: DC

## 2015-05-30 NOTE — Telephone Encounter (Signed)
Rx has been updated and sent per instruction.  Receipt confirmed by pharmacy.  I called back to advise.  Spoke with Kathryn [name] and relayed providers message. He expressed understanding and appreciation.  Says he is going to review all of her meds to make sure everything is okay.  Asked that he call us back if he has any questions at all, and we will be happy to help him.

## 2015-05-30 NOTE — Telephone Encounter (Signed)
Would you change the patient to Namenda generic please? 10mg  bid with 11 refills. He can still use the titration pack to titrate up to 28mg  daily and then he can switch to the generic 10mg  twice daily instead. Would you mind placing the prescription and calling and explaining to patient? Thanks!

## 2015-06-07 ENCOUNTER — Ambulatory Visit
Admission: RE | Admit: 2015-06-07 | Discharge: 2015-06-07 | Disposition: A | Discharge status: Home / Self Care | Payer: Medicare Other | Source: Ambulatory Visit | Attending: Neurology | Admitting: Neurology

## 2015-06-07 DIAGNOSIS — R410 Disorientation, unspecified: Secondary | ICD-10-CM

## 2015-06-07 DIAGNOSIS — F05 Delirium due to known physiological condition: Secondary | ICD-10-CM

## 2015-06-07 DIAGNOSIS — R251 Tremor, unspecified: Secondary | ICD-10-CM

## 2015-06-07 DIAGNOSIS — R413 Other amnesia: Secondary | ICD-10-CM | POA: Diagnosis not present

## 2015-06-12 ENCOUNTER — Telehealth: Payer: Self-pay | Admitting: *Deleted

## 2015-06-12 NOTE — Telephone Encounter (Signed)
-----   Message from Melvenia Beam, MD sent at 06/11/2015  5:04 PM EST ----- MRI of the brain showed generalized atrophy and atrophy in the areas we associate with memory loss and dementia. It did not show any strokes or masses. Thanks

## 2015-06-12 NOTE — Telephone Encounter (Signed)
Spoke w/ pt husband, [name] about MRI results (ok per DPR). Advised per Dr [name] that MRI brain showed generalized atrophy and in areas we associate with memory loss and dementia. Showed no strokes or masses. He verbalized understanding.

## 2015-08-07 ENCOUNTER — Telehealth: Payer: Self-pay | Admitting: Neurology

## 2015-08-07 NOTE — Telephone Encounter (Signed)
[name] called regarding whether or not to start Aricept, states patient has appointment 09/04/15.

## 2015-08-07 NOTE — Telephone Encounter (Signed)
Called pt husband back. He stated pt is taking donepezil 10mg  2 times daily. She started with the 5mg  and went up to 10mg . She is doing well. She has not started Namenda titration pack yet. Per AVS given at last OV, it stated "Start Aricept (donepezil) 5mg  daily. In 2-3 weeks can increase to 10mg  daily. In 8 weeks if no side effects from the Aricept, can start namenda as prescribed". He is confused as to what she should be doing. Advised I will verify with Dr [name] and call him back. He verbalized understanding.

## 2015-08-07 NOTE — Telephone Encounter (Signed)
Called husband back. Advised per Dr [name] that she continue to take generic aricept 10mg  once at night. Start Namenda titration pack in the morning. Once she finishes titration pack, she switches to generic namenda and takes the generic 2 times a day. He verbalized understanding and read it back to me. Told him to call for any more clarification.

## 2015-09-04 ENCOUNTER — Encounter: Payer: Self-pay | Admitting: Neurology

## 2015-09-04 ENCOUNTER — Ambulatory Visit (INDEPENDENT_AMBULATORY_CARE_PROVIDER_SITE_OTHER): Payer: Medicare Other | Admitting: Neurology

## 2015-09-04 VITALS — BP 161/69 | HR 73 | Ht 62.5 in | Wt 121.2 lb

## 2015-09-04 DIAGNOSIS — F0280 Dementia in other diseases classified elsewhere without behavioral disturbance: Secondary | ICD-10-CM | POA: Diagnosis not present

## 2015-09-04 DIAGNOSIS — G309 Alzheimer's disease, unspecified: Secondary | ICD-10-CM

## 2015-09-04 DIAGNOSIS — F0390 Unspecified dementia without behavioral disturbance: Secondary | ICD-10-CM | POA: Insufficient documentation

## 2015-09-04 NOTE — Progress Notes (Addendum)
[id number]    Provider:  Dr [name] Referring Provider: Kennyth Arnold, FNP Primary Care Physician:  Kennyth Arnold, FNP  CC: Memory changes  Interval history 09/04/2015: She is feeling better. She is having more bowel movements with the namenda. No diarrhea. It is not causing her discomfort, she is not dehydrated, she doesn't drink a lot of coffee, she has a great diet full of fiber. Anxiety level is better. She has projects to do at home, she is remaining active daily. She is here with her husband.   Reviewed MRi of the brain: IMPRESSION: This is an abnormal MRI of the brain without contrast showed the following: 1. Moderate generalized cortical atrophy that is most pronounced in the mesial temporal lobes. 2. Several T2/FLAIR hyperintense foci in the deep and subcortical white matter consistent with chronic microvascular ischemic changes. The extent is typical for age.  B12 and folate normal.   HPI: Kathryn [name] is a 82 y.o. female here as a referral from Dr. Justin Mend for memory loss. PMHx anxiety, HTN, fatige, headache. She lives with her husband in a single-family home. Husband is here with her. She functions well at home per patient, she cooks and likes to do yard work. She watches the news dails and enjoys watching movies. Patient says she forgets things at the store. Husband noticed the memory changes which started last October/November when they were dealing with bed bugs. Husband says directions were difficult for patient, he had to watch patient closely to make sure she followed directions. Previous to this the changes were subtle, she would forget little things. There has been a decline in memory since then. Slowly progressive. She cooks, cleans, she drives. One time the stove was left on but otherwise no accidents in the home or in the car. She doesn't drive far, she doesn't get lost. She knows where to go around her house. She can make dinner fine. One time  she forgot to add an ingredient in dinner but that was just an oversight. She is sleeping well. She writes down appointments and keep appointment cards. She has forgotten one appointment a long time ago. Husband does the finances, patient used to do the finances but husband took over because patient couldn't keep up with it. She was forgetting to pay bills. No behavioral changes except sometimes she gets aggravated at herself. No hallucinations or delusions. Mother with Alzheimers at about age 22. Husband is becoming concerned with wife's driving. She goes to yoga every week. No other focal neurologic symptoms. Older memories are intact. More recent memories impaired.   Reviewed notes, labs and imaging from outside physicians, which showed: patient was referred for evaluation of alzheimer's dementia. CMP unremarkable. TSH wnl.   Review of Systems: Patient complains of symptoms per HPI as well as the following symptoms: No fever, no chills, no CP, no sob. Pertinent negatives per HPI. All others negative.    Social History   Social History  . Marital Status: Married    Spouse Name: Kathryn [name]   . Number of Children: 3  . Years of Education: 12   Occupational History  . Not on file.   Social History Main Topics  . Smoking status: Former Research scientist (life sciences)  . Smokeless tobacco: Not on file  . Alcohol Use: Yes     Comment: wine  . Drug Use: No  . Sexual Activity: Not on file   Other Topics Concern  . Not on file   Social History Narrative  Lives at home with husband   Caffeine use: Drinks coffee/tea     Family History  Problem Relation Age of Onset  . Thyroid disease Mother   . Alzheimer's disease Mother   . Dementia Mother   . Hearing loss Brother   . Hearing loss Brother   . Heart Problems Brother     Past Medical History  Diagnosis Date  . Headache     Past Surgical History  Procedure Laterality Date  . Breast biopsy      Current Outpatient Prescriptions  Medication Sig  Dispense Refill  . Cholecalciferol (VITAMIN D3) 2000 UNITS TABS Take by mouth.    . Coenzyme Q10 (COQ10 PO) Take by mouth.    . cyanocobalamin 100 MCG tablet Take 100 mcg by mouth daily.    Marland Kitchen donepezil (ARICEPT) 10 MG tablet Take 1 tablet (10 mg total) by mouth at bedtime. 30 tablet 11  . folic acid (FOLVITE) A999333 MCG tablet Take 400 mcg by mouth daily.    . Grape Seed Extract 100 MG CAPS Take by mouth.    . magnesium gluconate (MAGONATE) 500 MG tablet Take 500 mg by mouth daily.    . memantine (NAMENDA) 10 MG tablet Take 1 tablet (10 mg total) by mouth 2 (two) times daily. 60 tablet 11  . NON FORMULARY MK-7 90 meq    . Omega-3 Fatty Acids (FISH OIL) 1000 MG CAPS Take by mouth.    . Potassium Gluconate 550 MG TABS Take by mouth.    . pyridoxine (B-6) 100 MG tablet Take 100 mg by mouth daily.    Marland Kitchen zinc gluconate 50 MG tablet Take 50 mg by mouth daily.     No current facility-administered medications for this visit.    Allergies as of 09/04/2015 - Review Complete 05/28/2015  Allergen Reaction Noted  . Sulfonamide derivatives  09/23/2010    Vitals: BP 161/69 mmHg  Pulse 73  Ht 5' 2.5" (1.588 m)  Wt 121 lb 3.2 oz (54.976 kg)  BMI 21.80 kg/m2 Last Weight:  Wt Readings from Last 1 Encounters:  09/04/15 121 lb 3.2 oz (54.976 kg)   Last Height:   Ht Readings from Last 1 Encounters:  09/04/15 5' 2.5" (1.588 m)     Cranial Nerves:  The pupils are equal, round, and reactive to light. The fundi are normal and spontaneous venous pulsations are present. Visual fields are full to finger confrontation. Extraocular movements are intact. Trigeminal sensation is intact and the muscles of mastication are normal. The face is symmetric. The palate elevates in the midline. Hearing intact. Voice is normal. Shoulder shrug is normal. The tongue has normal motion without fasciculations.   Coordination:  Normal finger to nose and heel to shin. Normal rapid alternating movements.   Gait:   Heel-toe and tandem gait are normal.   Motor Observation:  No asymmetry, no atrophy, and no involuntary movements noted. Tone:  Normal muscle tone.   Posture:  Posture is normal. normal erect   Strength:  Strength is V/V in the upper and lower limbs.    Sensation: intact to LT   Reflex Exam:  DTR's:  Deep tendon reflexes in the upper and lower extremities are normal bilaterally.  Toes: left upgoing  Clonus:  Clonus is absent.     Assessment/Plan: This is a very lovely 82 year old female with memory loss. MoCA 14/30. Likely Alzheimer's type dementia. MRi of the brain showed moderate generalized cortical atrophy that is most pronounced in the mesial  temporal lobes and several T2/FLAIR hyperintense foci in the deep and subcortical white matter consistent with chronic microvascular ischemic changes. The extent is typical for age. B12, folate and TSH WNL.  Repeat MoCA at next appointment Continue Aricept daily 10mg  and can increase to 20mg  daily.  Continue the Namenda 10mg  twice daily.   Recommend stop driving. Discussed with husband and wife. Provided a school for evaluation of driving.  Sarina Ill, MD  [facility] [address] [place], [place] [address]  Phone [phone] Fax [phone]  A total of 30 minutes was spent face-to-face with this patient and her husband,. Over half this time was spent on counseling patient on the  diagnosis and different diagnostic and therapeutic options available.

## 2015-09-04 NOTE — Patient Instructions (Addendum)
Remember to drink plenty of fluid, eat healthy meals and do not skip any meals. Try to eat protein with a every meal and eat a healthy snack such as fruit or nuts in between meals. Try to keep a regular sleep-wake schedule and try to exercise daily, particularly in the form of walking, 20-30 minutes a day, if you can.   As far as your medications are concerned, I would like to suggest: Continue current medications  I would like to see you back in 6 months, sooner if we need to. Please call us with any interim questions, concerns, problems, updates or refill requests.   Our phone number is [phone]. We also have an after hours call service for urgent matters and there is a physician on-call for urgent questions. For any emergencies you know to call 911 or go to the nearest emergency room   

## 2015-10-02 ENCOUNTER — Telehealth: Payer: Self-pay | Admitting: Neurology

## 2015-10-02 NOTE — Telephone Encounter (Signed)
Husband called to advise, wife wants to stop memantine (NAMENDA) 10 MG tablet due to a lot of lower GI stress and bloating, husband advised wife to stop all supplements temporarily. States patient has been on MEMANTINE for 2 months.

## 2015-10-02 NOTE — Telephone Encounter (Signed)
Called husband back. Advised per Dr [name] to have her stop medication to see if this relieves sx. Advised to let us know how she is doing. He verbalized understanding.

## 2016-03-03 ENCOUNTER — Ambulatory Visit (INDEPENDENT_AMBULATORY_CARE_PROVIDER_SITE_OTHER): Payer: Medicare Other | Admitting: Neurology

## 2016-03-03 VITALS — BP 187/87 | HR 70 | Ht 62.5 in | Wt 129.6 lb

## 2016-03-03 DIAGNOSIS — F0280 Dementia in other diseases classified elsewhere without behavioral disturbance: Secondary | ICD-10-CM

## 2016-03-03 DIAGNOSIS — G309 Alzheimer's disease, unspecified: Secondary | ICD-10-CM | POA: Diagnosis not present

## 2016-03-03 DIAGNOSIS — R413 Other amnesia: Secondary | ICD-10-CM

## 2016-03-03 MED ORDER — DONEPEZIL HCL 10 MG PO TABS: 10.0000 mg | ORAL_TABLET | Freq: Every day | ORAL | 11 refills | Status: DC

## 2016-03-03 NOTE — Progress Notes (Signed)
[id number]    Provider:  Dr [name] Referring Provider: Kennyth Arnold, FNP Primary Care Physician:  Kennyth Arnold, FNP  CC: Memory changes  Interval history: 03/03/2016: Here for follow up of alzheimer's dementia. Repeat moCA 12/30 (last 14/30). She is on Aricept, did not tolerate the Namenda. B12 normal. Last TSH WNL. Here with husband. She does chair yoga. Her blood pressures at home have been normal avg 133/70, elevation in the office likely from anxiety. She is sleeping well. There is some stress with patient's daughter which is better. They have sons with substance abuse and this has improved as well. She still sees her high school friends from [place] her HS at [employer] 4x a year, no behavioral prolems, she is lovely.   Interval history 09/04/2015: She is feeling better. She is having more bowel movements with the namenda. No diarrhea. It is not causing her discomfort, she is not dehydrated, she doesn't drink a lot of coffee, she has a great diet full of fiber. Anxiety level is better. She has projects to do at home, she is remaining active daily. She is here with her husband.   Reviewed MRi of the brain: IMPRESSION: This is an abnormal MRI of the brain without contrast showed the following: 1. Moderate generalized cortical atrophy that is most pronounced in the mesial temporal lobes. 2. Several T2/FLAIR hyperintense foci in the deep and subcortical white matter consistent with chronic microvascular ischemic changes. The extent is typical for age.  B12 and folate normal.   HPI: Kathryn [name] is a 82 y.o. female here as a referral from Dr. Justin Mend for memory loss. PMHx anxiety, HTN, fatige, headache. She lives with her husband in a single-family home. Husband is here with her. She functions well at home per patient, she cooks and likes to do yard work. She watches the news dails and enjoys watching movies. Patient says she forgets things at the store. Husband  noticed the memory changes which started last October/November when they were dealing with bed bugs. Husband says directions were difficult for patient, he had to watch patient closely to make sure she followed directions. Previous to this the changes were subtle, she would forget little things. There has been a decline in memory since then. Slowly progressive. She cooks, cleans, she drives. One time the stove was left on but otherwise no accidents in the home or in the car. She doesn't drive far, she doesn't get lost. She knows where to go around her house. She can make dinner fine. One time she forgot to add an ingredient in dinner but that was just an oversight. She is sleeping well. She writes down appointments and keep appointment cards. She has forgotten one appointment a long time ago. Husband does the finances, patient used to do the finances but husband took over because patient couldn't keep up with it. She was forgetting to pay bills. No behavioral changes except sometimes she gets aggravated at herself. No hallucinations or delusions. Mother with Alzheimers at about age 63. Husband is becoming concerned with wife's driving. She goes to yoga every week. No other focal neurologic symptoms. Older memories are intact. More recent memories impaired.   Reviewed notes, labs and imaging from outside physicians, which showed: patient was referred for evaluation of alzheimer's dementia. CMP unremarkable. TSH wnl.   Review of Systems: Patient complains of symptoms per HPI as well as the following symptoms: No fever, no chills, no CP, no sob. Pertinent negatives per HPI.  All others negative.   Social History   Social History  . Marital status: Married    Spouse name: Kathryn [name]   . Number of children: 3  . Years of education: 12   Occupational History  . Not on file.   Social History Main Topics  . Smoking status: Former Research scientist (life sciences)  . Smokeless tobacco: Not on file  . Alcohol use Yes     Comment:  wine  . Drug use: No  . Sexual activity: Not on file   Other Topics Concern  . Not on file   Social History Narrative   Lives at home with husband   Caffeine use: Drinks coffee/tea     Family History  Problem Relation Age of Onset  . Thyroid disease Mother   . Alzheimer's disease Mother   . Dementia Mother   . Hearing loss Brother   . Hearing loss Brother   . Heart Problems Brother     Past Medical History:  Diagnosis Date  . Headache     Past Surgical History:  Procedure Laterality Date  . BREAST BIOPSY      Current Outpatient Prescriptions  Medication Sig Dispense Refill  . Cholecalciferol (VITAMIN D3) 2000 UNITS TABS Take by mouth.    . Coenzyme Q10 (COQ10 PO) Take by mouth.    . cyanocobalamin 100 MCG tablet Take 100 mcg by mouth daily.    Marland Kitchen donepezil (ARICEPT) 10 MG tablet Take 1 tablet (10 mg total) by mouth at bedtime. 30 tablet 11  . folic acid (FOLVITE) A999333 MCG tablet Take 400 mcg by mouth daily.    . Grape Seed Extract 100 MG CAPS Take by mouth.    . NON FORMULARY MK-7 90 meq    . Omega-3 Fatty Acids (FISH OIL) 1000 MG CAPS Take by mouth.    . Potassium Gluconate 550 MG TABS Take by mouth.    . pyridoxine (B-6) 100 MG tablet Take 100 mg by mouth daily.    Marland Kitchen zinc gluconate 50 MG tablet Take 50 mg by mouth daily.     No current facility-administered medications for this visit.     Allergies as of 03/03/2016 - Review Complete 03/03/2016  Allergen Reaction Noted  . Sulfonamide derivatives  09/23/2010    Vitals: BP (!) 176/74 (BP Location: Right Arm, Patient Position: Sitting, Cuff Size: Normal)   Pulse 65   Ht 5' 2.5" (1.588 m)   Wt 129 lb 9.6 oz (58.8 kg)   BMI 23.33 kg/m  Last Weight:  Wt Readings from Last 1 Encounters:  03/03/16 129 lb 9.6 oz (58.8 kg)   Last Height:   Ht Readings from Last 1 Encounters:  03/03/16 5' 2.5" (1.588 m)     Cranial Nerves:  The pupils are equal, round, and reactive to light. Visual fields are full to  finger confrontation. Extraocular movements are intact. Trigeminal sensation is intact and the muscles of mastication are normal. The face is symmetric. The palate elevates in the midline. Hearing intact. Voice is normal. Shoulder shrug is normal. The tongue has normal motion without fasciculations.   Motor Observation:  No asymmetry, no atrophy, and no involuntary movements noted. Tone:  Normal muscle tone.   Posture:  Posture is normal. normal erect   Strength:  Strength is V/V in the upper and lower limbs.    Sensation: intact to LT   Reflex Exam:  DTR's:  Deep tendon reflexes in the upper and lower extremities are normal bilaterally.  Toes: left upgoing  Clonus:  Clonus is absent.     Assessment/Plan: This is a very lovely 82 year old female with memory loss. MoCA 14/30. Likely Alzheimer's type dementia. MRi of the brain showed moderate generalized cortical atrophy that is most pronounced in the mesial temporal lobes and several T2/FLAIR hyperintense foci in the deep and subcortical white matter consistent with chronic microvascular ischemic changes. The extent is typical for age. B12, folate and TSH WNL.  Repeat MoCA at next appointment - 14/30 today Continue Aricept daily 10mg . Could increase but not sure this would have a significant clinical impact and risk of side effects. Discussed with patient and husband, they will stay on current dose. Did not tolerate the Namenda 10mg  twice daily.   Recommend stop driving. Discussed with husband and wife. Provided a school for evaluation of driving. Discussed dementia, progressive disorder, will return in 6 months.  Sarina Ill, MD  [facility] [address] [place], [place] [address]  Phone [phone] Fax [phone]  A total of 30 minutes was spent face-to-face with this patient. Over half this time was spent on counseling patient on the dementia  diagnosis and different diagnostic and therapeutic options available.

## 2016-04-16 ENCOUNTER — Ambulatory Visit (INDEPENDENT_AMBULATORY_CARE_PROVIDER_SITE_OTHER): Payer: Medicare Other | Admitting: Family Medicine

## 2016-04-16 ENCOUNTER — Encounter: Payer: Self-pay | Admitting: Family Medicine

## 2016-04-16 VITALS — BP 130/78 | HR 72 | Resp 12 | Ht 62.5 in | Wt 133.5 lb

## 2016-04-16 DIAGNOSIS — G309 Alzheimer's disease, unspecified: Secondary | ICD-10-CM | POA: Diagnosis not present

## 2016-04-16 DIAGNOSIS — Z5181 Encounter for therapeutic drug level monitoring: Secondary | ICD-10-CM

## 2016-04-16 DIAGNOSIS — R635 Abnormal weight gain: Secondary | ICD-10-CM

## 2016-04-16 DIAGNOSIS — F0280 Dementia in other diseases classified elsewhere without behavioral disturbance: Secondary | ICD-10-CM

## 2016-04-16 DIAGNOSIS — R252 Cramp and spasm: Secondary | ICD-10-CM

## 2016-04-16 LAB — VITAMIN D 25 HYDROXY (VIT D DEFICIENCY, FRACTURES): VITD: 67.12 ng/mL (ref 30.00–100.00)

## 2016-04-16 LAB — BASIC METABOLIC PANEL
BUN: 21 mg/dL (ref 6–23)
CHLORIDE: 104 meq/L (ref 96–112)
CO2: 30 meq/L (ref 19–32)
CREATININE: 1.17 mg/dL (ref 0.40–1.20)
Calcium: 9.3 mg/dL (ref 8.4–10.5)
GFR: 47.3 mL/min — ABNORMAL LOW (ref >60.00)
Glucose, Bld: 107 mg/dL — ABNORMAL HIGH (ref 70–99)
POTASSIUM: 4.2 meq/L (ref 3.5–5.1)
Sodium: 141 mEq/L (ref 135–145)

## 2016-04-16 LAB — TSH: TSH: 1.19 u[IU]/mL (ref 0.35–4.50)

## 2016-04-16 NOTE — Progress Notes (Signed)
Pre visit review using our clinic review tool, if applicable. No additional management support is needed unless otherwise documented below in the visit note. 

## 2016-04-16 NOTE — Patient Instructions (Signed)
A few things to remember from today's visit:   Weight gain - Plan: TSH  Encounter for medication monitoring - Plan: VITAMIN D 25 Hydroxy (Vit-D Deficiency, Fractures)  Stop crackers with wine and continue eating healthy. Weight is in normal range but we need to be careful with continuing wait gain.   We have ordered labs or studies at this visit.   It can take up to 1-2 weeks for results and processing. IF results require follow up or explanation, we will call you with instructions. Clinically stable results will be released to your [facility]. If you have not heard from [place] or cannot find your results in [facility] in 2 weeks please contact our office at [phone].  If you are not yet signed up for [facility], please consider signing up  Please be sure medication list is accurate. If a new problem present, please set up appointment sooner than planned today.

## 2016-04-16 NOTE — Progress Notes (Signed)
HPI:   [name] is a 82 y.o. female, who is here today with her husband to establish care with me.  Former PCP: [name]. Last preventive routine visit: 2 years ago.  Concerns today: wt gain and loose stools.  Ms. Kathryn [name] is concerned about steadily wt gain for the past few months. She states that she still is able to fit in clothes she wore years ago, she attributes wt gain to eating better and less stress. Up to the beginning of this year she was dealing with family issues, 3 of her children were having issues (behavioral and marital issues);so 2 sons had to move with them and she has her husband had to help all 3 children financially.  During this stressful time she was not eating well, poor appetite but now she eats "normal", healthy. She drinks 2 glasses of wine daily one with crackers and a second glass with dinner, denies any Hx of alcohol abuse and drinks a total of 4 Oz daily. She exercises regularly, chair yoga. She denies any depression. She has history of anxiety disorder, she doesn't feel like this is an active problem at this time. She denies suicidal thoughts, sleeps well.   According to husband, she has gained about 16 pounds since new medication was started. She has history of Alzheimer's disease, she is currently following with neurologist. Currently she is on Aricept 10 mg daily. According to husband, she did not tolerate a Namenda. Not sure about medication helping with memory. She still drives "some" but always accompanied by husband.   -She states that she was also concerned about changes in bowel habits, loose stools, she denies any blood in the stool or abdominal pain. She has noted some improvement with dietary changes, so now she feels like this is not a major problem. It seems exacerbated by eating certain food. She is reporting 1-2 soft or loose stools daily.  -Listed on problem list is hypertension, according to husband she has never been  on antihypertensive medication. I noted some elevated BP during prior office visits.  -History of mild hypercholesterolemia, she follows a low-fat diet.  -She takes OTC vitamin D 2000 units. According to husband, one of her children, who lives in [place], recommended increasing dose of vitamin D from 2000 units 4000 units. She is not sure about history of vitamin D deficiency.  -She also takes OTC potassium supplementation daily, complaining of years of lower extremity cramps, intermittently. She denies any edema or erythema. She has not identified exacerbating or alleviating factors.  -I noted some mild hand tremor, with intention, according to patient she has had it for a while, unchanged.      Chemistry      Component Value Date/Time   NA 141 03/15/2014 0854   K 3.9 03/15/2014 0854   CL 104 03/15/2014 0854   CO2 31 03/15/2014 0854   BUN 18 03/15/2014 0854   CREATININE 1.0 03/15/2014 0854      Component Value Date/Time   CALCIUM 9.3 03/15/2014 0854   ALKPHOS 52 03/15/2014 0854   AST 16 03/15/2014 0854   ALT 11 03/15/2014 0854   BILITOT 1.6 (H) 03/15/2014 0854        Review of Systems  Constitutional: Positive for appetite change (improved), fatigue (no more than usual) and unexpected weight change. Negative for activity change and fever.  HENT: Negative for dental problem, mouth sores, nosebleeds and trouble swallowing.   Eyes: Negative for redness and visual  disturbance.  Respiratory: Negative for cough, shortness of breath and wheezing.   Cardiovascular: Negative for chest pain, palpitations and leg swelling.  Gastrointestinal: Positive for diarrhea. Negative for abdominal distention, abdominal pain, blood in stool, nausea and vomiting.  Endocrine: Negative for cold intolerance, heat intolerance, polydipsia, polyphagia and polyuria.  Genitourinary: Negative for decreased urine volume, difficulty urinating, dysuria, hematuria, vaginal bleeding and vaginal discharge.    Musculoskeletal: Positive for myalgias (cramps). Negative for gait problem.  Skin: Negative for rash and wound.  Neurological: Positive for tremors. Negative for seizures, syncope, weakness, numbness and headaches.  Psychiatric/Behavioral: Negative for confusion, hallucinations and sleep disturbance. The patient is nervous/anxious.       Current Outpatient Prescriptions on File Prior to Visit  Medication Sig Dispense Refill  . Cholecalciferol (VITAMIN D3) 2000 UNITS TABS Take by mouth.    . Coenzyme Q10 (COQ10 PO) Take by mouth.    . cyanocobalamin 100 MCG tablet Take 100 mcg by mouth daily.    Marland Kitchen donepezil (ARICEPT) 10 MG tablet Take 1 tablet (10 mg total) by mouth at bedtime. 30 tablet 11  . folic acid (FOLVITE) A999333 MCG tablet Take 400 mcg by mouth daily.    . Grape Seed Extract 100 MG CAPS Take by mouth.    . NON FORMULARY MK-7 90 meq    . Omega-3 Fatty Acids (FISH OIL) 1000 MG CAPS Take by mouth.    . Potassium Gluconate 550 MG TABS Take by mouth.    . pyridoxine (B-6) 100 MG tablet Take 100 mg by mouth daily.    Marland Kitchen zinc gluconate 50 MG tablet Take 50 mg by mouth daily.     No current facility-administered medications on file prior to visit.      Past Medical History:  Diagnosis Date  . Headache    Allergies  Allergen Reactions  . Sulfonamide Derivatives     REACTION: Swollen joints    Family History  Problem Relation Age of Onset  . Thyroid disease Mother   . Alzheimer's disease Mother   . Dementia Mother   . Hearing loss Brother   . Hearing loss Brother   . Heart Problems Brother     Social History   Social History  . Marital status: Married    Spouse name: Kathryn [name]   . Number of children: 3  . Years of education: 53   Social History Main Topics  . Smoking status: Former Research scientist (life sciences)  . Smokeless tobacco: None  . Alcohol use Yes     Comment: wine  . Drug use: No  . Sexual activity: Not Asked   Other Topics Concern  . None   Social History Narrative    Lives at home with husband   Caffeine use: Drinks coffee/tea     Vitals:   04/16/16 1057  BP: 130/78  Pulse: 72  Resp: 12    Body mass index is 24.03 kg/m.  Wt Readings from Last 3 Encounters:  04/16/16 133 lb 8 oz (60.6 kg)  03/03/16 129 lb 9.6 oz (58.8 kg)  09/04/15 121 lb 3.2 oz (55 kg)      Physical Exam  Nursing note and vitals reviewed. Constitutional: She is oriented to person, place, and time. She appears well-developed and well-nourished. No distress.  HENT:  Head: Atraumatic.  Mouth/Throat: Oropharynx is clear and moist and mucous membranes are normal.  Eyes: Conjunctivae and EOM are normal. Pupils are equal, round, and reactive to light.  Neck: No thyroid mass and no thyromegaly present.  Cardiovascular: Normal rate and regular rhythm.   No murmur heard. Pulses:      Dorsalis pedis pulses are 2+ on the right side, and 2+ on the left side.  Respiratory: Effort normal and breath sounds normal. No respiratory distress.  GI: Soft. Bowel sounds are normal. She exhibits no distension, no abdominal bruit and no mass. There is no hepatomegaly. There is no tenderness.  Musculoskeletal: She exhibits no edema.  Lymphadenopathy:    She has no cervical adenopathy.  Neurological: She is alert and oriented to person, place, and time. She has normal strength. She displays tremor. Coordination normal.  Today she is oriented to place, she has difficulty finding words. It took care a few seconds to remember the month of the year. Repetitive during OV's. Stable gait with no assistance needed.  Skin: Skin is warm. No erythema.  Psychiatric: Her mood appears anxious.  Well groomed, good eye contact.      ASSESSMENT AND PLAN:    Kande was seen today for transfer.  Diagnoses and all orders for this visit:   Weight gain  BMI is in normal range. I recommend trying to decrease amount of wine, which also has a good amount of calories, but she is not interested. She  agrees with stopping daily crackers intake.Continue a healthy diet and regular exercise.I will see her back in 2 months.  -     TSH  Encounter for medication monitoring  I recommend no change in current dose vitamin D, 2000 units daily.Further recommendations will be given according to lab results.   -     VITAMIN D 25 Hydroxy (Vit-D Deficiency, Fractures)  Cramp of both lower extremities  Stretching exercises may help. Further recommendations in regard to continuing OTC potassium will be given according to lab results.  -     Basic Metabolic Panel  Alzheimer's dementia  We discussed some side effects of Aricept, which could be the reason she is having some loose stools.She has noted some improvement with avoidance of certain foods that aggravate problem, so no changes in current management. She will keep Appointment with neurologist.      Total OV from 11:13 am to 12:02 pm, over 50% of time was dedicated to discussion of medication side effects, reviewing OTC supplements, discussing possible causes of loose stools, and explaining benefits as well as adverse effects of vitamin D supplementation.     Jeslyn Amsler G. [name], MD  [facility]. [place] office.

## 2016-04-20 ENCOUNTER — Encounter: Payer: Self-pay | Admitting: Family Medicine

## 2016-06-12 ENCOUNTER — Encounter: Payer: Self-pay | Admitting: Neurology

## 2016-06-19 ENCOUNTER — Ambulatory Visit: Payer: Medicare Other | Admitting: Family Medicine

## 2016-06-20 ENCOUNTER — Ambulatory Visit (INDEPENDENT_AMBULATORY_CARE_PROVIDER_SITE_OTHER): Payer: Medicare Other | Admitting: Family Medicine

## 2016-06-20 ENCOUNTER — Encounter: Payer: Self-pay | Admitting: Family Medicine

## 2016-06-20 VITALS — BP 128/80 | HR 85 | Temp 98.0°F | Resp 12 | Ht 62.5 in | Wt 130.4 lb

## 2016-06-20 DIAGNOSIS — H9192 Unspecified hearing loss, left ear: Secondary | ICD-10-CM | POA: Diagnosis not present

## 2016-06-20 DIAGNOSIS — N183 Chronic kidney disease, stage 3 unspecified: Secondary | ICD-10-CM | POA: Insufficient documentation

## 2016-06-20 DIAGNOSIS — Z23 Encounter for immunization: Secondary | ICD-10-CM

## 2016-06-20 DIAGNOSIS — R51 Headache: Secondary | ICD-10-CM

## 2016-06-20 DIAGNOSIS — H6120 Impacted cerumen, unspecified ear: Secondary | ICD-10-CM

## 2016-06-20 DIAGNOSIS — R519 Headache, unspecified: Secondary | ICD-10-CM

## 2016-06-20 LAB — CBC
HCT: 43.6 % (ref 36.0–46.0)
HEMOGLOBIN: 14.8 g/dL (ref 12.0–15.0)
MCHC: 33.9 g/dL (ref 30.0–36.0)
MCV: 94.6 fl (ref 78.0–100.0)
PLATELETS: 337 10*3/uL (ref 150.0–400.0)
RBC: 4.61 Mil/uL (ref 3.87–5.11)
RDW: 13.5 % (ref 11.5–15.5)
WBC: 9.5 10*3/uL (ref 4.0–10.5)

## 2016-06-20 LAB — MICROALBUMIN / CREATININE URINE RATIO
Creatinine,U: 30.5 mg/dL
MICROALB/CREAT RATIO: 2.3 mg/g (ref 0.0–30.0)
Microalb, Ur: <0.7 mg/dL (ref 0.0–1.9)

## 2016-06-20 NOTE — Progress Notes (Signed)
HPI:   [name] is a 82 y.o. female, who is here today with her husband to follow on last office visit, 04/16/2016, when she was concerned about unexplained weight gain.   She has not changed her diet, she is more active around the house.  She is doing yard work and chores around [employer], she likes this time of the year.   -Hx of Alzheimer's disease, according to husband,she was instructed to hold Aricept because there was a concern about medication causing headaches.  She tells me that headache started after dental work, not sure about date, reporting feeling headache during dental procedure on upper right molars. Headache is right parietal. Denies cervical pain or associated visual changes,nausea,vomiting, or focal deficit. Husband has not noted MS changes.  She tells me that she is feeling better and according to husband, she is not taking as much Motrin as she was when headache fist started.   She has history of headaches, listed on her problem list, she states that this specific headache is different.   Concerns today:  Today she is c/o "wax problem" left side hearing loss. She has Hx of cerumen impaction and attributes problem to cerumen impaction. Denies earache,tinnitus, recent travel or URI. She does not use Q tips.   She had labs done after last OV, husband is also concerned about glucose 107 in September 2017.   Noted e GFR decreased, she takes Motrin for aches and pains. She has not noted gross hematuria or foam in the urine.    Review of Systems  Constitutional: Negative for appetite change, fatigue, fever and unexpected weight change.  HENT: Positive for dental problem and hearing loss. Negative for congestion, ear discharge, ear pain, facial swelling, mouth sores, nosebleeds, rhinorrhea, sore throat, tinnitus, trouble swallowing and voice change.   Eyes: Negative for photophobia, redness and visual disturbance.  Respiratory: Negative for  cough, shortness of breath and wheezing.   Cardiovascular: Negative for chest pain and leg swelling.  Gastrointestinal: Negative for abdominal pain, nausea and vomiting.       Negative for changes in bowel habits.  Endocrine: Negative for polydipsia, polyphagia and polyuria.  Genitourinary: Negative for decreased urine volume, difficulty urinating and hematuria.  Musculoskeletal: Negative for gait problem and neck pain.  Skin: Negative for rash.  Neurological: Positive for headaches. Negative for dizziness, syncope, speech difficulty and weakness.  Psychiatric/Behavioral: Negative for agitation, confusion and hallucinations. The patient is nervous/anxious.       Current Outpatient Prescriptions on File Prior to Visit  Medication Sig Dispense Refill  . Cholecalciferol (VITAMIN D3) 2000 UNITS TABS Take by mouth.    . Coenzyme Q10 (COQ10 PO) Take by mouth.    . cyanocobalamin 100 MCG tablet Take 100 mcg by mouth daily.    . folic acid (FOLVITE) A999333 MCG tablet Take 400 mcg by mouth daily.    . Grape Seed Extract 100 MG CAPS Take by mouth.    . NON FORMULARY MK-7 90 meq    . Omega-3 Fatty Acids (FISH OIL) 1000 MG CAPS Take by mouth.    . Potassium Gluconate 550 MG TABS Take by mouth.    . pyridoxine (B-6) 100 MG tablet Take 100 mg by mouth daily.    Marland Kitchen zinc gluconate 50 MG tablet Take 50 mg by mouth daily.    Marland Kitchen donepezil (ARICEPT) 10 MG tablet Take 1 tablet (10 mg total) by mouth at bedtime. (Patient not taking: Reported on 06/20/2016) 30  tablet 11   No current facility-administered medications on file prior to visit.      Past Medical History:  Diagnosis Date  . Headache    Allergies  Allergen Reactions  . Sulfonamide Derivatives     REACTION: Swollen joints    Social History   Social History  . Marital status: Married    Spouse name: [name]   . Number of children: 3  . Years of education: 23   Social History Main Topics  . Smoking status: Former Research scientist (life sciences)  . Smokeless  tobacco: None  . Alcohol use Yes     Comment: wine  . Drug use: No  . Sexual activity: Not Asked   Other Topics Concern  . None   Social History Narrative   Lives at home with husband   Caffeine use: Drinks coffee/tea     Vitals:   06/20/16 0915  BP: 128/80  Pulse: 85  Resp: 12  Temp: 98 F (36.7 C)   Body mass index is 23.47 kg/m.  Wt Readings from Last 3 Encounters:  06/20/16 130 lb 6 oz (59.1 kg)  04/16/16 133 lb 8 oz (60.6 kg)  03/03/16 129 lb 9.6 oz (58.8 kg)     Physical Exam  Nursing note and vitals reviewed. Constitutional: She appears well-developed and well-nourished. No distress.  HENT:  Head: Atraumatic.  Right Ear: External ear normal. Decreased hearing is noted.  Left Ear: External ear normal. Decreased hearing is noted.  Mouth/Throat: Oropharynx is clear and moist and mucous membranes are normal.  Cerumen excess bilateral, L>R. TM seen partially, bilateral. Gross hearing decreased bilateral.   Eyes: Conjunctivae and EOM are normal.  Cardiovascular: Normal rate and regular rhythm.   No murmur heard. Respiratory: Effort normal and breath sounds normal. No respiratory distress.  GI: Soft. She exhibits no abdominal bruit and no mass. There is no hepatomegaly. There is no tenderness.  Musculoskeletal: She exhibits no edema.       Cervical back: She exhibits decreased range of motion. She exhibits no tenderness and no bony tenderness.  Lymphadenopathy:    She has no cervical adenopathy.  Neurological: She is alert. She has normal strength. No cranial nerve deficit (except for hearing). Coordination normal.  Stable gait with no assistance needed.  Skin: Skin is warm. No erythema.  Psychiatric: Her speech is normal. Her mood appears anxious. She does not exhibit a depressed mood.  Well groomed, good eye contact.      ASSESSMENT AND PLAN:     [name] was seen today for follow-up.  Diagnoses and all orders for this visit:   Lab Results    Component Value Date   WBC 9.5 06/20/2016   HGB 14.8 06/20/2016   HCT 43.6 06/20/2016   MCV 94.6 06/20/2016   PLT 337.0 06/20/2016   Lab Results  Component Value Date   MICROALBUR <0.7 06/20/2016    Hearing loss of left ear, unspecified hearing loss type  We discussed possible etiologies, conductive Vs neuro sensorial.  Hearing test done in office abnormal. Mildly better after ear lavage. I recommend evaluation with audiologists but she prefers to wait on referral for now. Instructed about warning signs.   Impacted cerumen, unspecified laterality  Cerumen excess on both ears, L>R. I still can see TM partially, so now sure if this alone explains her hearing loss. After verbal consent and discussion of risk she had ear lavage, some cerumen came out but still a good amount left in ear. OTC Debrox or warm  mineral oil (a couple drops in left ear daily) and no Q tips recommended for now.  Headache, unspecified headache type  Reported as improved after holding Aricept. Husband has not noted major changes in behavior/memory. We discussed some of evidence about Alzheimer's treatment and benefits from medication. According to husband, he is supposed to let Dr [name] know about headache in one more week and then determine next step in regard to Aricept treatment.  CKD (chronic kidney disease), stage III  04/16/16 e GFR 47.3 and 2 years ago 57.6. She denies Hx of HTN or DM but on PMHx HTN is listed. Chronic use of NSAID's could also contribute to problem. I recommended low salt diet and avoid NSAID's + adequate hydration for now. Caution with K+ supplementation she takes OTC for leg cramps.  F/U in 5-6 months.  -     Urine Microalbumin w/creat. ratio -     CBC  Need for immunization against influenza -     Flu vaccine HIGH DOSE PF      -Ms. Kathryn [name] and her husband were advised to return sooner than planned today if new concerns arise.       Mariangel Ringley G. [name],  MD  [facility]. [place] office.

## 2016-06-20 NOTE — Progress Notes (Signed)
Pre visit review using our clinic review tool, if applicable. No additional management support is needed unless otherwise documented below in the visit note. 

## 2016-06-20 NOTE — Patient Instructions (Addendum)
A few things to remember from today's visit:   Hearing loss of left ear, unspecified hearing loss type  Impacted cerumen, unspecified laterality  Headache, unspecified headache type  CKD (chronic kidney disease), stage III - Plan: Urine Microalbumin w/creat. ratio, CBC   Hearing loss mildly better, but you may also have sensorial hearing loss and may need audiologists. Please let me know if you change your mind.   To preserve kidney function and/or slow progression of disease:  Low salt diet and adequate hydration. Avoiding medicines known as "nonsteroidal anti-inflammatory drugs," or NSAIDs. These medicines include ibuprofen (sample brand names: Advil, Motrin) and naproxen (sample brand name: Aleve).  Adequate blood pressure control. Low phosphorus diet.   Please be sure medication list is accurate. If a new problem present, please set up appointment sooner than planned today.

## 2016-06-30 ENCOUNTER — Encounter: Payer: Self-pay | Admitting: Neurology

## 2016-07-09 ENCOUNTER — Telehealth: Payer: Self-pay | Admitting: *Deleted

## 2016-07-09 ENCOUNTER — Encounter: Payer: Self-pay | Admitting: Neurology

## 2016-07-09 NOTE — Telephone Encounter (Signed)
Called husband back regarding mychart message he sent AA,MD. Advised per AA,MD she would like her to proceed to ER. Husband stated she is having a constant headache, her speech is getting worse, trouble finding words, having short term memory.   I placed him on hold and spoke with AA,MD while he was on the phone. She stated they can come in for f/u with CM,NP at 745am tomorrow, but she still recommends ER. Up to them on how they proceed. I relayed this to husband and he verbalized understanding. Husband does not really want to take her to ER. He stated he did not want to wait for a long time to find out what he already knows. He declined taking her to ER and is going to come for f/u appt. Advised him to check in by 67am. He verbalized understanding.

## 2016-07-10 ENCOUNTER — Ambulatory Visit (INDEPENDENT_AMBULATORY_CARE_PROVIDER_SITE_OTHER): Payer: Medicare Other | Admitting: Nurse Practitioner

## 2016-07-10 ENCOUNTER — Ambulatory Visit
Admission: RE | Admit: 2016-07-10 | Discharge: 2016-07-10 | Disposition: A | Discharge status: Home / Self Care | Payer: Medicare Other | Source: Ambulatory Visit | Attending: Nurse Practitioner | Admitting: Nurse Practitioner

## 2016-07-10 ENCOUNTER — Encounter: Payer: Self-pay | Admitting: Nurse Practitioner

## 2016-07-10 VITALS — BP 161/71 | HR 75 | Resp 14 | Ht 62.5 in | Wt 131.0 lb

## 2016-07-10 DIAGNOSIS — R519 Headache, unspecified: Secondary | ICD-10-CM

## 2016-07-10 DIAGNOSIS — I10 Essential (primary) hypertension: Secondary | ICD-10-CM | POA: Diagnosis not present

## 2016-07-10 DIAGNOSIS — R51 Headache: Secondary | ICD-10-CM

## 2016-07-10 NOTE — Progress Notes (Addendum)
[facility]  PATIENT: Kathryn [name] DOB: 1933-09-27   REASON FOR VISIT: Follow-up for worsening headaches HISTORY FROM: Patient and husband    HISTORY OF PRESENT ILLNESS:UPDATE 12/17/2017CM Kathryn [name], 82 year old female returns for follow-up on an urgent basis for worsening of headaches. They started out being several times a week and now they're almost daily. She points to the top and side of her head when the pain is. She is having problems describing what the pain feels like. Blood pressure elevated in the office today but husband states it is normal at home. She denies any recent falls or head injury. She returns for reevaluation. She does appear to have word finding difficulties.    Interval history: 03/03/2016: Here for follow up of alzheimer's dementia. Repeat moCA 12/30 (last 14/30). She is on Aricept, did not tolerate the Namenda. B12 normal. Last TSH WNL. Here with husband. She does chair yoga. Her blood pressures at home have been normal avg 133/70, elevation in the office likely from anxiety. She is sleeping well. There is some stress with patient's daughter which is better. They have sons with substance abuse and this has improved as well. She still sees her high school friends from [place] her HS at [employer] 4x a year, no behavioral prolems, she is lovely.   Interval history 09/04/2015: She is feeling better. She is having more bowel movements with the namenda. No diarrhea. It is not causing her discomfort, she is not dehydrated, she doesn't drink a lot of coffee, she has a great diet full of fiber. Anxiety level is better. She has projects to do at home, she is remaining active daily. She is here with her husband.   REVIEW OF SYSTEMS: Full 14 system review of systems performed and notable only for those listed, all others are neg:  Constitutional: neg  Cardiovascular: neg Ear/Nose/Throat: neg  Skin: neg Eyes: neg Respiratory: neg Gastroitestinal: neg    Hematology/Lymphatic: neg  Endocrine: neg Musculoskeletal:neg Allergy/Immunology: neg Neurological: Headache, speech difficulty Psychiatric: neg Sleep : neg   ALLERGIES: Allergies  Allergen Reactions  . Sulfonamide Derivatives     REACTION: Swollen joints    HOME MEDICATIONS: Outpatient Medications Prior to Visit  Medication Sig Dispense Refill  . Cholecalciferol (VITAMIN D3) 2000 UNITS TABS Take by mouth.    . Coenzyme Q10 (COQ10 PO) Take by mouth.    . cyanocobalamin 100 MCG tablet Take 100 mcg by mouth daily.    . folic acid (FOLVITE) A999333 MCG tablet Take 400 mcg by mouth daily.    . Grape Seed Extract 100 MG CAPS Take by mouth.    . NON FORMULARY MK-7 90 meq    . Omega-3 Fatty Acids (FISH OIL) 1000 MG CAPS Take by mouth.    . Potassium Gluconate 550 MG TABS Take by mouth.    . pyridoxine (B-6) 100 MG tablet Take 100 mg by mouth daily.    Marland Kitchen zinc gluconate 50 MG tablet Take 50 mg by mouth daily.    Marland Kitchen donepezil (ARICEPT) 10 MG tablet Take 1 tablet (10 mg total) by mouth at bedtime. (Patient not taking: Reported on 07/10/2016) 30 tablet 11   No facility-administered medications prior to visit.     PAST MEDICAL HISTORY: Past Medical History:  Diagnosis Date  . Headache     PAST SURGICAL HISTORY: Past Surgical History:  Procedure Laterality Date  . BREAST BIOPSY      FAMILY HISTORY: Family History  Problem Relation Age of Onset  .  Thyroid disease Mother   . Alzheimer's disease Mother   . Dementia Mother   . Hearing loss Brother   . Hearing loss Brother   . Heart Problems Brother     SOCIAL HISTORY: Social History   Social History  . Marital status: Married    Spouse name: [name]   . Number of children: 3  . Years of education: 12   Occupational History  . Not on file.   Social History Main Topics  . Smoking status: Former Research scientist (life sciences)  . Smokeless tobacco: Not on file  . Alcohol use Yes     Comment: wine  . Drug use: No  . Sexual activity: Not on  file   Other Topics Concern  . Not on file   Social History Narrative   Lives at home with husband   Caffeine use: Drinks coffee/tea      PHYSICAL EXAM  Vitals:   07/10/16 0742  BP: (!) 161/71  Pulse: 75  Resp: 14  Weight: 131 lb (59.4 kg)  Height: 5' 2.5" (1.588 m)   Body mass index is 23.58 kg/m.  Generalized: Well developed, in no acute distress  Head: normocephalic and atraumatic,. Oropharynx benign  Neck: Supple, no carotid bruits  Cardiac: Regular rate rhythm, no murmur  Musculoskeletal: No deformity   Neurological examination   Mentation: Alert  Follows all commands , word finding problems    Cranial nerve II-XII: Pupils were equal round reactive to light extraocular movements were full, visual field were full on confrontational test. Facial sensation and strength were normal. hearing was intact to finger rubbing bilaterally. Uvula tongue midline. head turning and shoulder shrug were normal and symmetric.Tongue protrusion into cheek strength was normal. Motor: normal bulk and tone, full strength in the BUE, BLE, fine finger movements normal, no pronator drift. No focal weakness Sensory: normal and symmetric to light touch, pinprick, and  Vibration, in the upper and lower extremities Coordination: finger-nose-finger,  no dysmetria Reflexes: Symmetric upper and lower plantar responses were flexor bilaterally. Gait and Station: Rising up from seated position without assistance, normal stance,  moderate stride, good arm swing, smooth turning, able to perform tiptoe, and heel walking without difficulty. Tandem gait is mildly unsteady  DIAGNOSTIC DATA (LABS, IMAGING, TESTING) - I reviewed patient records, labs, notes, testing and imaging myself where available.  Lab Results  Component Value Date   WBC 9.5 06/20/2016   HGB 14.8 06/20/2016   HCT 43.6 06/20/2016   MCV 94.6 06/20/2016   PLT 337.0 06/20/2016      Component Value Date/Time   NA 141 04/16/2016 1217    K 4.2 04/16/2016 1217   CL 104 04/16/2016 1217   CO2 30 04/16/2016 1217   GLUCOSE 107 (H) 04/16/2016 1217   BUN 21 04/16/2016 1217   CREATININE 1.17 04/16/2016 1217   CALCIUM 9.3 04/16/2016 1217   PROT 6.8 03/15/2014 0854   ALBUMIN 4.2 03/15/2014 0854   AST 16 03/15/2014 0854   ALT 11 03/15/2014 0854   ALKPHOS 52 03/15/2014 0854   BILITOT 1.6 (H) 03/15/2014 0854     Lab Results  Component Value Date   [id number] 05/28/2015   Lab Results  Component Value Date   TSH 1.19 04/16/2016      ASSESSMENT AND PLAN  82 y.o. year old female  has a past medical history of Headache. here On an urgent basis for worsening headaches PLAN: Will check labs rule out temporal arteritis CT of the head to rule out  subdural hematoma F/U with Dr. [name] as planned Dennie Bible, [facility], [facility], APRN  Personally  participated in and made any corrections needed to history, physical, neuro exam,assessment and plan as stated above. I agree with findings and plan.  Sarina Ill, MD [facility]  [facility] [address], [place], [place] [address] [phone]

## 2016-07-10 NOTE — Patient Instructions (Signed)
Will check labs rule out temporal arteritis CT of the head F/U with Dr. [name] as planned

## 2016-07-11 ENCOUNTER — Telehealth: Payer: Self-pay | Admitting: *Deleted

## 2016-07-11 LAB — C-REACTIVE PROTEIN: CRP: 1.8 mg/L (ref 0.0–4.9)

## 2016-07-11 LAB — SEDIMENTATION RATE: Sed Rate: 3 mm/hr (ref 0–40)

## 2016-07-11 NOTE — Telephone Encounter (Signed)
-----   Message from Dennie Bible, NP sent at 07/11/2016  8:27 AM EST ----- Labs look good. Please call patient

## 2016-07-11 NOTE — Telephone Encounter (Signed)
Called and spoke to husband (ok per DPR) that labs looked good.  He verbalized understanding. CT head pending.

## 2016-07-14 ENCOUNTER — Telehealth: Payer: Self-pay | Admitting: *Deleted

## 2016-07-14 NOTE — Telephone Encounter (Signed)
I spoke to husband.  Gave him the CT results.  Nothing acute.  Normal age related changes.  She has been taking bufferin every 2 hours.   (today 5 tabs, yesterday 6 tabs).  I told him to try tylenol as directed for the head pain.  He will call back if needed.

## 2016-07-14 NOTE — Telephone Encounter (Signed)
I spoke to wife and gave results of CT, but also will call back and speak to husband, as pt has ALZ.

## 2016-07-14 NOTE — Telephone Encounter (Signed)
Patients husband is returning a call regarding CT results for the patient.

## 2016-07-15 ENCOUNTER — Encounter: Payer: Self-pay | Admitting: Nurse Practitioner

## 2016-09-02 ENCOUNTER — Telehealth: Payer: Self-pay | Admitting: Neurology

## 2016-09-02 ENCOUNTER — Encounter: Payer: Self-pay | Admitting: Family Medicine

## 2016-09-02 ENCOUNTER — Encounter: Payer: Self-pay | Admitting: Neurology

## 2016-09-02 NOTE — Telephone Encounter (Signed)
Can we get patient in this week or next week, Kathryn [name]? She is having worsening headaches. Happy to squeeze herin at noon or 430 thanks

## 2016-09-02 NOTE — Telephone Encounter (Signed)
Called and spoke to husband. Made F/u for wifes worsening headaches on 09/04/16 check in 230pm. He verbalized understanding.

## 2016-09-04 ENCOUNTER — Ambulatory Visit (INDEPENDENT_AMBULATORY_CARE_PROVIDER_SITE_OTHER): Payer: Medicare Other | Admitting: Neurology

## 2016-09-04 ENCOUNTER — Encounter: Payer: Self-pay | Admitting: Neurology

## 2016-09-04 VITALS — BP 163/84 | HR 76 | Wt 130.0 lb

## 2016-09-04 DIAGNOSIS — G44209 Tension-type headache, unspecified, not intractable: Secondary | ICD-10-CM | POA: Diagnosis not present

## 2016-09-04 MED ORDER — GABAPENTIN 100 MG PO CAPS: 100.0000 mg | ORAL_CAPSULE | Freq: Three times a day (TID) | ORAL | 11 refills | Status: DC | PRN

## 2016-09-04 NOTE — Patient Instructions (Addendum)
Remember to drink plenty of fluid, eat healthy meals and do not skip any meals. Try to eat protein with a every meal and eat a healthy snack such as fruit or nuts in between meals. Try to keep a regular sleep-wake schedule and try to exercise daily, particularly in the form of walking, 20-30 minutes a day, if you can.   As far as your medications are concerned, I would like to suggest Gabapentin up to 3x a day as needed  As far as diagnostic testing: MRI of the brain if needed  I would like to see you back in 4 months, sooner if we need to. Please call us with any interim questions, concerns, problems, updates or refill requests.   Our phone number is [phone]. We also have an after hours call service for urgent matters and there is a physician on-call for urgent questions. For any emergencies you know to call 911 or go to the nearest emergency room  Gabapentin capsules or tablets What is this medicine? GABAPENTIN (GA ba pen tin) is used to control partial seizures in adults with epilepsy. It is also used to treat certain types of nerve pain. This medicine may be used for other purposes; ask your health care provider or pharmacist if you have questions. COMMON BRAND NAME(S): Active-PAC with Gabapentin, Gabarone, Neurontin What should I tell my health care provider before I take this medicine? They need to know if you have any of these conditions: -kidney disease -suicidal thoughts, plans, or attempt; a previous suicide attempt by you or a family member -an unusual or allergic reaction to gabapentin, other medicines, foods, dyes, or preservatives -pregnant or trying to get pregnant -breast-feeding How should I use this medicine? Take this medicine by mouth with a glass of water. Follow the directions on the prescription label. You can take it with or without food. If it upsets your stomach, take it with food.Take your medicine at regular intervals. Do not take it more often than directed.  Do not stop taking except on your doctor's advice. If you are directed to break the 600 or 800 mg tablets in half as part of your dose, the extra half tablet should be used for the next dose. If you have not used the extra half tablet within 28 days, it should be thrown away. A special MedGuide will be given to you by the pharmacist with each prescription and refill. Be sure to read this information carefully each time. Talk to your pediatrician regarding the use of this medicine in children. Special care may be needed. Overdosage: If you think you have taken too much of this medicine contact a poison control center or emergency room at once. NOTE: This medicine is only for you. Do not share this medicine with others. What if I miss a dose? If you miss a dose, take it as soon as you can. If it is almost time for your next dose, take only that dose. Do not take double or extra doses. What may interact with this medicine? Do not take this medicine with any of the following medications: -other gabapentin products This medicine may also interact with the following medications: -alcohol -antacids -antihistamines for allergy, cough and cold -certain medicines for anxiety or sleep -certain medicines for depression or psychotic disturbances -homatropine; hydrocodone -naproxen -narcotic medicines (opiates) for pain -phenothiazines like chlorpromazine, mesoridazine, prochlorperazine, thioridazine This list may not describe all possible interactions. Give your health care provider a list of all the medicines, herbs, non-prescription  drugs, or dietary supplements you use. Also tell them if you smoke, drink alcohol, or use illegal drugs. Some items may interact with your medicine. What should I watch for while using this medicine? Visit your doctor or health care professional for regular checks on your progress. You may want to keep a record at home of how you feel your condition is responding to  treatment. You may want to share this information with your doctor or health care professional at each visit. You should contact your doctor or health care professional if your seizures get worse or if you have any new types of seizures. Do not stop taking this medicine or any of your seizure medicines unless instructed by your doctor or health care professional. Stopping your medicine suddenly can increase your seizures or their severity. Wear a medical identification bracelet or chain if you are taking this medicine for seizures, and carry a card that lists all your medications. You may get drowsy, dizzy, or have blurred vision. Do not drive, use machinery, or do anything that needs mental alertness until you know how this medicine affects you. To reduce dizzy or fainting spells, do not sit or stand up quickly, especially if you are an older patient. Alcohol can increase drowsiness and dizziness. Avoid alcoholic drinks. Your mouth may get dry. Chewing sugarless gum or sucking hard candy, and drinking plenty of water will help. The use of this medicine may increase the chance of suicidal thoughts or actions. Pay special attention to how you are responding while on this medicine. Any worsening of mood, or thoughts of suicide or dying should be reported to your health care professional right away. Women who become pregnant while using this medicine may enroll in the [place] Pregnancy Registry by calling [phone]. This registry collects information about the safety of antiepileptic drug use during pregnancy. What side effects may I notice from receiving this medicine? Side effects that you should report to your doctor or health care professional as soon as possible: -allergic reactions like skin rash, itching or hives, swelling of the face, lips, or tongue -worsening of mood, thoughts or actions of suicide or dying Side effects that usually do not require medical attention  (report to your doctor or health care professional if they continue or are bothersome): -constipation -difficulty walking or controlling muscle movements -dizziness -nausea -slurred speech -tiredness -tremors -weight gain This list may not describe all possible side effects. Call your doctor for medical advice about side effects. You may report side effects to FDA at 1-800-FDA-1088. Where should I keep my medicine? Keep out of reach of children. This medicine may cause accidental overdose and death if it taken by other adults, children, or pets. Mix any unused medicine with a substance like cat litter or coffee grounds. Then throw the medicine away in a sealed container like a sealed bag or a coffee can with a lid. Do not use the medicine after the expiration date. Store at room temperature between 15 and 30 degrees C (59 and 86 degrees F). NOTE: This sheet is a summary. It may not cover all possible information. If you have questions about this medicine, talk to your doctor, pharmacist, or health care provider.  2017 Elsevier/Gold Standard (2013-09-16 15:26:50)

## 2016-09-04 NOTE — Progress Notes (Signed)
[facility]   Provider:  Dr [name] Referring Provider: Kennyth Arnold, FNP Primary Care Physician:  Kennyth Arnold, FNP  CC: Memory changes and headaches  Interval history 09/04/2016: Patient continues to have headaches. CT of the head was negative for acute intracranial abnormality. She has daily headaches. She was taking up to 7 tylenol daily and I advised him to stop as this can cause significant side effects including rebound headaches. No falls or head injury. She has a difficult time describing the headaches due to dementia. We stopped Aricept to see if this would help but it has not made any changes. They were using the tylenol and they stopped the last 2 days. The last 2 days she has not taken tylenol and she has not mentioned the headache. No double vision or neck pain. CRP and sed rate were normal. I have advised no OTC medications more than 2-3x a week to avoid rebound headaches. No previous history of migraines. Headache is in the temple areas. No vision changes or other signs of temporal arteritis. They are going to see the eye doctor. She also stopped all supplements and they are slowly adding them back. I advised we don;t exactly know what is in all these supplements and I advise them to stop if there are headaches. She has been better these last 2 days and I advised if it continues/worsens we can order an MRI brain. Headaches are with activity. A home nurse took blood pressure about 140. She has fallen twice she sits in a chair and she got up and turned too fast and fell on the floor. We could also try low dose neurontin. I recommended PT in the home for gait and safety and a home safety study and they declined, I advised safety precautions for fall risks. Husband thinks stress may be causing the headache due to her brother's health problems and recent illness, her son has also been in the hospital twice in the last 3 months for alcoholism. Husband takes notes today.        Interval history: 03/03/2016: Here for follow up of alzheimer's dementia. Repeat moCA 12/30 (last 14/30). She is on Aricept, did not tolerate the Namenda. B12 normal. Last TSH WNL. Here with husband. She does chair yoga. Her blood pressures at home have been normal avg 133/70, elevation in the office likely from anxiety. She is sleeping well. There is some stress with patient's daughter which is better. They have sons with substance abuse and this has improved as well. She still sees her high school friends from [place] her HS at [employer] 4x a year, no behavioral prolems, she is lovely.   Interval history 09/04/2015: She is feeling better. She is having more bowel movements with the namenda. No diarrhea. It is not causing her discomfort, she is not dehydrated, she doesn't drink a lot of coffee, she has a great diet full of fiber. Anxiety level is better. She has projects to do at home, she is remaining active daily. She is here with her husband.   Reviewed MRi of the brain: IMPRESSION: This is an abnormal MRI of the brain without contrast showed the following: 1. Moderate generalized cortical atrophy that is most pronounced in the mesial temporal lobes. 2. Several T2/FLAIR hyperintense foci in the deep and subcortical white matter consistent with chronic microvascular ischemic changes. The extent is typical for age.  B12 and folate normal.   HPI: Kathryn [name] is a 83 y.o. female here as  a referral from Dr. Justin Mend for memory loss. PMHx anxiety, HTN, fatige, headache. She lives with her husband in a single-family home. Husband is here with her. She functions well at home per patient, she cooks and likes to do yard work. She watches the news dails and enjoys watching movies. Patient says she forgets things at the store. Husband noticed the memory changes which started last October/November when they were dealing with bed bugs. Husband says directions were difficult for patient, he had  to watch patient closely to make sure she followed directions. Previous to this the changes were subtle, she would forget little things. There has been a decline in memory since then. Slowly progressive. She cooks, cleans, she drives. One time the stove was left on but otherwise no accidents in the home or in the car. She doesn't drive far, she doesn't get lost. She knows where to go around her house. She can make dinner fine. One time she forgot to add an ingredient in dinner but that was just an oversight. She is sleeping well. She writes down appointments and keep appointment cards. She has forgotten one appointment a long time ago. Husband does the finances, patient used to do the finances but husband took over because patient couldn't keep up with it. She was forgetting to pay bills. No behavioral changes except sometimes she gets aggravated at herself. No hallucinations or delusions. Mother with Alzheimers at about age 49. Husband is becoming concerned with wife's driving. She goes to yoga every week. No other focal neurologic symptoms. Older memories are intact. More recent memories impaired.   Reviewed notes, labs and imaging from outside physicians, which showed: patient was referred for evaluation of alzheimer's dementia. CMP unremarkable. TSH wnl.   Review of Systems: Patient complains of symptoms per HPI as well as the following symptoms: No fever, no chills, no CP, no sob. Pertinent negatives per HPI. All others negative.    Social History   Social History  . Marital status: Married    Spouse name: Kathryn [name]   . Number of children: 3  . Years of education: 12   Occupational History  . Not on file.   Social History Main Topics  . Smoking status: Former Research scientist (life sciences)  . Smokeless tobacco: Never Used  . Alcohol use Yes     Comment: wine  . Drug use: No  . Sexual activity: Not on file   Other Topics Concern  . Not on file   Social History Narrative   Lives at home with husband    Caffeine use: Drinks coffee/tea     Family History  Problem Relation Age of Onset  . Thyroid disease Mother   . Alzheimer's disease Mother   . Dementia Mother   . Hearing loss Brother   . Hearing loss Brother   . Heart Problems Brother     Past Medical History:  Diagnosis Date  . Headache     Past Surgical History:  Procedure Laterality Date  . BREAST BIOPSY      Current Outpatient Prescriptions  Medication Sig Dispense Refill  . Cholecalciferol (VITAMIN D3) 2000 UNITS TABS Take by mouth.    . Coenzyme Q10 (COQ10 PO) Take by mouth.    . cyanocobalamin 100 MCG tablet Take 100 mcg by mouth daily.    . folic acid (FOLVITE) A999333 MCG tablet Take 400 mcg by mouth daily.    . Grape Seed Extract 100 MG CAPS Take by mouth.    . NON FORMULARY MK-7  90 meq    . Omega-3 Fatty Acids (FISH OIL) 1000 MG CAPS Take by mouth.    . Potassium Gluconate 550 MG TABS Take by mouth.    . pyridoxine (B-6) 100 MG tablet Take 100 mg by mouth daily.    Marland Kitchen zinc gluconate 50 MG tablet Take 50 mg by mouth daily.    Marland Kitchen donepezil (ARICEPT) 10 MG tablet Take 1 tablet (10 mg total) by mouth at bedtime. (Patient not taking: Reported on 07/10/2016) 30 tablet 11   No current facility-administered medications for this visit.     Allergies as of 09/04/2016 - Review Complete 09/04/2016  Allergen Reaction Noted  . Aricept [donepezil hcl] Other (See Comments) 09/04/2016  . Sulfonamide derivatives  09/23/2010    Vitals: BP (!) 163/84   Pulse 76   Wt 130 lb (59 kg)   BMI 23.40 kg/m  Last Weight:  Wt Readings from Last 1 Encounters:  09/04/16 130 lb (59 kg)   Last Height:   Ht Readings from Last 1 Encounters:  07/10/16 5' 2.5" (1.588 m)        Cranial Nerves:  The pupils are equal, round, and reactive to light. Visual fields are full to finger confrontation. Extraocular movements are intact. Trigeminal sensation is intact and the muscles of mastication are normal. The face is symmetric. The palate  elevates in the midline. Hearing intact. Voice is normal. Shoulder shrug is normal. The tongue has normal motion without fasciculations.   Motor Observation:  No asymmetry, no atrophy, and no involuntary movements noted. Tone:  Normal muscle tone.   Posture:  Posture is normal. normal erect   Strength:  Strength is V/V in the upper and lower limbs.    Sensation: intact to LT   Reflex Exam:  DTR's:  Deep tendon reflexes in the upper and lower extremities are normal bilaterally.  Toes: left upgoing Clonus:  Clonus is absent.     Assessment/Plan: This is a very lovely 83 year old female with memory loss. MoCA 14/30. Likely Alzheimer's type dementia. MRi of the brain showed moderate generalized cortical atrophy that is most pronounced in the mesial temporal lobes and several T2/FLAIR hyperintense foci in the deep and subcortical white matter consistent with chronic microvascular ischemic changes. The extent is typical for age. B12, folate and TSH WNL. She has several months of headaches in the setting of stress, CT of the head negative.  Did not tolerate Aricept, may consider Exelon Did not tolerate the Namenda 10mg  twice daily.  No Driving As far as your medications are concerned, I would like to suggest Gabapentin up to 3x a day as needed for headache As far as diagnostic testing: MRI of the brain if needed if headache persist. There was a component of medication overuse and they have stopped using OTC multiple times a day will follow Discussed dementia, progressive disorder, will return in 6 months.  Sarina Ill, MD  [facility] [address] [place], [place] [address]  Phone [phone] Fax [phone]  A total of 30 minutes was spent face-to-face with this patient. Over half this time was spent on counseling patient on the dementia, stress headache diagnosis and different diagnostic and  therapeutic options available.

## 2016-09-11 ENCOUNTER — Encounter: Payer: Self-pay | Admitting: Family Medicine

## 2016-09-11 ENCOUNTER — Ambulatory Visit (INDEPENDENT_AMBULATORY_CARE_PROVIDER_SITE_OTHER)
Admission: RE | Admit: 2016-09-11 | Discharge: 2016-09-11 | Disposition: A | Discharge status: Home / Self Care | Payer: Medicare Other | Source: Ambulatory Visit | Attending: Family Medicine | Admitting: Family Medicine

## 2016-09-11 ENCOUNTER — Ambulatory Visit (INDEPENDENT_AMBULATORY_CARE_PROVIDER_SITE_OTHER): Payer: Medicare Other | Admitting: Family Medicine

## 2016-09-11 VITALS — BP 120/76 | HR 76 | Resp 12 | Ht 62.5 in | Wt 130.1 lb

## 2016-09-11 DIAGNOSIS — K5900 Constipation, unspecified: Secondary | ICD-10-CM

## 2016-09-11 DIAGNOSIS — R059 Cough, unspecified: Secondary | ICD-10-CM

## 2016-09-11 DIAGNOSIS — R140 Abdominal distension (gaseous): Secondary | ICD-10-CM

## 2016-09-11 DIAGNOSIS — R05 Cough: Secondary | ICD-10-CM

## 2016-09-11 LAB — COMPREHENSIVE METABOLIC PANEL
ALBUMIN: 4.3 g/dL (ref 3.5–5.2)
ALK PHOS: 63 U/L (ref 39–117)
ALT: 11 U/L (ref 0–35)
AST: 15 U/L (ref 0–37)
BUN: 12 mg/dL (ref 6–23)
CO2: 29 mEq/L (ref 19–32)
Calcium: 9.7 mg/dL (ref 8.4–10.5)
Chloride: 105 mEq/L (ref 96–112)
Creatinine, Ser: 0.99 mg/dL (ref 0.40–1.20)
GFR: 57.29 mL/min — AB (ref >60.00)
Glucose, Bld: 97 mg/dL (ref 70–99)
POTASSIUM: 4.3 meq/L (ref 3.5–5.1)
Sodium: 143 mEq/L (ref 135–145)
TOTAL PROTEIN: 6.8 g/dL (ref 6.0–8.3)
Total Bilirubin: 0.8 mg/dL (ref 0.2–1.2)

## 2016-09-11 LAB — TSH: TSH: 0.98 u[IU]/mL (ref 0.35–4.50)

## 2016-09-11 NOTE — Patient Instructions (Signed)
A few things to remember from today's visit:   Abdominal bloating - Plan: Comprehensive metabolic panel, TSH, US Abdomen Complete  Constipation, unspecified constipation type - Plan: Comprehensive metabolic panel, TSH  Examination today I did not find masses.  We will arrange ultrasound and wait for lab results.  In the meantime light diet, try to identify aggravating factors.     Please be sure medication list is accurate. If a new problem present, please set up appointment sooner than planned today.

## 2016-09-11 NOTE — Progress Notes (Signed)
Pre visit review using our clinic review tool, if applicable. No additional management support is needed unless otherwise documented below in the visit note. 

## 2016-09-11 NOTE — Progress Notes (Signed)
HPI:   ACUTE VISIT:  Chief Complaint  Patient presents with  . Bloated    Kathryn.Kathryn [name] is a 83 y.o. female, who is here today with her husband complaining of abdominal bloating sensation.  Kathryn [name] has Hx of Alzheimer's disease, she provides some of history and husband helps with some details.  She is not sure for how long she has had this problems but husband thinks it has been going on for a few months, around 3 months. Abdominal distension, feeling bloated, "a lot of gas." She has Hx of constipation but has been doing better since she increase fiber intake and eating prunes.  She has not identify exacerbating or relieving factors. This is constant, cannot wear some of her clothes because too tight. She has not noted blood in stool, nausea,vomiting, or abdominal pain, she has some abdominal discomfort. She states that she feels "miserable." It is not alleviated by defecation, she has a bowel movement daily as far as she drinks Prune juice or eat prunes, otherwise it would be q 2-3 days.  She denies alcohol abuse, husband states that she still drinks 2 glasses of wine daily. Hx of CKD III, she has not noted gross hematuria or decreased urine output.  Husband discontinued all her supplements for a couple weeks and did not noted any improvement, he has been adding one by one back. Aricept was also discontinued.  She has not had fever,chills, abnormal wt loss, decreased appetite, or Kathryn changes.  No dysphagia,chest pain,dyspnea or wheezing.   Colonoscopy 06/2011 diminutive polyp removed, scattered colon diverticula, otherwise normal. Recommended follow up as needed.   She is following with Dr [name] because of headaches, which seems to be rebound headaches. She is on gabapentin 1-2 caps daily.  -On examination today ? Right basilar rales. Husband has noted occasional non productive cough. Former smoker.   Review of Systems  Constitutional: Positive for fatigue  (stable for a while.). Negative for activity change, appetite change, chills and fever.  HENT: Negative for mouth sores, nosebleeds, sore throat and trouble swallowing.   Respiratory: Negative for shortness of breath and wheezing.   Cardiovascular: Negative for palpitations and leg swelling.  Gastrointestinal: Positive for abdominal distention and constipation. Negative for abdominal pain, blood in stool, nausea and vomiting.  Endocrine: Negative for cold intolerance, heat intolerance, polydipsia, polyphagia and polyuria.  Genitourinary: Negative for decreased urine volume, dysuria, hematuria, pelvic pain, vaginal bleeding and vaginal discharge.  Musculoskeletal: Negative for gait problem and myalgias.  Skin: Negative for color change and rash.  Neurological: Positive for headaches. Negative for syncope and weakness.  Hematological: Negative for adenopathy. Does not bruise/bleed easily.  Psychiatric/Behavioral: Negative for confusion and sleep disturbance. The patient is nervous/anxious.       Current Outpatient Prescriptions on File Prior to Visit  Medication Sig Dispense Refill  . Cholecalciferol (VITAMIN D3) 2000 UNITS TABS Take by mouth.    . Coenzyme Q10 (COQ10 PO) Take by mouth.    . cyanocobalamin 100 MCG tablet Take 100 mcg by mouth daily.    . folic acid (FOLVITE) A999333 MCG tablet Take 400 mcg by mouth daily.    Marland Kitchen gabapentin (NEURONTIN) 100 MG capsule Take 1 capsule (100 mg total) by mouth 3 (three) times daily as needed. 90 capsule 11  . Grape Seed Extract 100 MG CAPS Take by mouth.    . NON FORMULARY MK-7 90 meq    . Omega-3 Fatty Acids (FISH OIL) 1000 MG CAPS Take  by mouth.    . Potassium Gluconate 550 MG TABS Take by mouth.    . pyridoxine (B-6) 100 MG tablet Take 100 mg by mouth daily.    Marland Kitchen zinc gluconate 50 MG tablet Take 50 mg by mouth daily.     No current facility-administered medications on file prior to visit.      Past Medical History:  Diagnosis Date  .  Headache    Allergies  Allergen Reactions  . Aricept [Donepezil Hcl] Other (See Comments)    "giving stomach and digestive problems"  . Sulfonamide Derivatives     REACTION: Swollen joints    Social History   Social History  . Marital status: Married    Spouse name: [name]   . Number of children: 3  . Years of education: 15   Social History Main Topics  . Smoking status: Former Research scientist (life sciences)  . Smokeless tobacco: Never Used  . Alcohol use Yes     Comment: wine  . Drug use: No  . Sexual activity: Not Asked   Other Topics Concern  . None   Social History Narrative   Lives at home with husband   Caffeine use: Drinks coffee/tea     Vitals:   09/11/16 1047  BP: 120/76  Pulse: 76  Resp: 12  O2 sat 96% at RA.  Body mass index is 23.41 kg/m.    Wt Readings from Last 3 Encounters:  09/11/16 130 lb 1 oz (59 kg)  09/04/16 130 lb (59 kg)  07/10/16 131 lb (59.4 kg)     Physical Exam  Nursing note and vitals reviewed. Constitutional: She appears well-developed and well-nourished. She does not appear ill. No distress.  HENT:  Head: Atraumatic.  Mouth/Throat: Oropharynx is clear and moist and mucous membranes are normal.  Eyes: Conjunctivae and EOM are normal. No scleral icterus.  Neck: No tracheal deviation present. No thyroid mass and no thyromegaly present.  Cardiovascular: Normal rate and regular rhythm.   No murmur heard. Respiratory: Effort normal. No respiratory distress. She has rales (? Fine left base).  GI: Soft. Bowel sounds are normal. She exhibits no distension, no fluid wave, no abdominal bruit and no mass. There is no hepatomegaly. There is tenderness (mild) in the right upper quadrant and right lower quadrant. There is no rigidity, no rebound, no guarding and no CVA tenderness.  Musculoskeletal: She exhibits no edema or tenderness.  Lymphadenopathy:    She has no cervical adenopathy.  Neurological: She is alert. She has normal strength. She displays  tremor. Gait normal.  Skin: Skin is warm. No erythema.  Psychiatric: Her mood appears anxious.  Well groomed, good eye contact.      ASSESSMENT AND PLAN:   Kathryn [name] was seen today for bloated.  Diagnoses and all orders for this visit:  Abdominal bloating  We discussed possible causes. On examination I did not appreciate masses or ascitics. ? Diverticulosis, constipation, diet among some. Abdominal U/S will be arranged.  Further recommendations will be given according to lab results.  -     Comprehensive metabolic panel -     TSH -     US Abdomen Complete; Future  Constipation, unspecified constipation type  Continue fiber an fluid adequate intake. Consider Miralax OTC if needed.  -     Comprehensive metabolic panel -     TSH  Cough  ? Fines rales right base, no other symptom that suggest CAP. CXR order placed.  -     DG Chest 2  View; Future     -Kathryn.Christella Noa and her husband were advised to return or notify a doctor immediately if symptoms worsen or persist or new concerns arise.       [name]. [name], MD  [facility]. [place] office.

## 2016-09-12 ENCOUNTER — Encounter: Payer: Self-pay | Admitting: Family Medicine

## 2016-09-16 ENCOUNTER — Encounter: Payer: Self-pay | Admitting: Family Medicine

## 2016-09-18 ENCOUNTER — Encounter: Payer: Self-pay | Admitting: Family Medicine

## 2016-09-24 ENCOUNTER — Ambulatory Visit
Admission: RE | Admit: 2016-09-24 | Discharge: 2016-09-24 | Disposition: A | Discharge status: Home / Self Care | Payer: Medicare Other | Source: Ambulatory Visit | Attending: Family Medicine | Admitting: Family Medicine

## 2016-09-24 DIAGNOSIS — R140 Abdominal distension (gaseous): Secondary | ICD-10-CM

## 2016-09-25 ENCOUNTER — Encounter: Payer: Self-pay | Admitting: Family Medicine

## 2016-10-01 NOTE — Progress Notes (Signed)
HPI:   ACUTE VISIT:  Chief Complaint  Patient presents with  . discuss eye exam    [name] is a 83 y.o. female, who is here today with her husband, who is concerned about findings on recent opthalmologic examination. Her husband helps her with answering questions, he provides most of the history.  She had eye examination on 09/25/16, Dr Claudean Kinds. According to husband, this was her regular f/u appt.  She has had some "blurry" vision intermittent for a while, "long time ago" but husband thinks this has been going on for 3-4 months, occasionally, and lasts max 4 min. She denies episodes of blindness. She states that episodes do not last long   Last episode on 09/25/16, she was watching TV, she felt "something" in her head (points left  temporal area). She tells me it as no pain but "little color dots" on left peripheral field vision then left  blurry vision for a few seconds. She has not sure about exacerbating or alleviating factors.  According to husband, these symptoms were discussed during ophthalmologic examination; eye pressure was a "little elevated" during her last eye exam, normal retina (per husband report). She has a follow up in 6 months.  She denies dizziness or tinnitus.  According to husband she does not have many complaints when she is active,"moving round."  She has hx of anxiety, increased stress due to family issues. She goes to chair yoga classes and this helps greatly.   -I saw her last on 09/11/16 because persistent bloating sensation, she has been concerned about a mass or "somthing" in her abdomen causing increased in abdominal circumference.  Abdominal U/S done 09/24/16: 1. Single large gallstone of 1.7 cm in diameter. No pain over the gallbladder currently with compression. 2. Cannot exclude mild fatty infiltration of the liver. No focal hepatic abnormality is seen. 3. Much of the pancreas is obscured by bowel gas.    Denies abdominal pain,  nausea, vomiting, or changes in bowel habits.  She tells me that she found "old clothes" she has not worn in years, they fit perfect, so she feels more comfortable, she is not longer concerned about possible abdominal "mass."  -She follows with neurologists, Dr [name], recently seen because persistent headaches, which were attributed to Tylenol rebound symptoms. Headache is otherwise better, she is taking Gabapentin 100 mg tid, which has helped with headaches and "tension."   Lab Results  Component Value Date   CRP 1.8 07/10/2016   Lab Results  Component Value Date   ESRSEDRATE 3 07/10/2016    Hx of Alzheimer's dementia, Aricept was discontinued, not sure if this med was causing headaches or abdominal discomfort/bloating.She did not tolerate Namenda.  Head CT 07/2016: Unremarkable CT scan of the head without contrast showing only mild age-appropriate changes of chronic microvascular ischemia and generalized atrophy    Brain MRI 06/2015:  1.   Moderate generalized cortical atrophy that is most pronounced in the mesial temporal lobes. 2.   Several T2/FLAIR hyperintense foci in the deep and subcortical white matter consistent with chronic microvascular ischemic changes. The extent is typical for age.    Review of Systems  Constitutional: Negative for activity change, appetite change, fatigue and fever.  HENT: Negative for mouth sores, nosebleeds and trouble swallowing.   Eyes: Positive for visual disturbance. Negative for photophobia, pain and redness.  Respiratory: Negative for cough, shortness of breath and wheezing.   Cardiovascular: Negative for chest pain and leg swelling.  Gastrointestinal:  Negative for abdominal pain, nausea and vomiting.       Negative for changes in bowel habits.  Genitourinary: Negative for decreased urine volume and hematuria.  Skin: Negative for rash.  Neurological: Positive for headaches. Negative for dizziness, syncope, facial asymmetry, weakness and  numbness.  Psychiatric/Behavioral: Positive for confusion (baseline). The patient is nervous/anxious.       Current Outpatient Prescriptions on File Prior to Visit  Medication Sig Dispense Refill  . Cholecalciferol (VITAMIN D3) 2000 UNITS TABS Take by mouth.    . Coenzyme Q10 (COQ10 PO) Take by mouth.    . cyanocobalamin 100 MCG tablet Take 100 mcg by mouth daily.    . folic acid (FOLVITE) 132 MCG tablet Take 400 mcg by mouth daily.    Marland Kitchen gabapentin (NEURONTIN) 100 MG capsule Take 1 capsule (100 mg total) by mouth 3 (three) times daily as needed. 90 capsule 11  . Grape Seed Extract 100 MG CAPS Take by mouth.    . NON FORMULARY MK-7 90 meq    . Omega-3 Fatty Acids (FISH OIL) 1000 MG CAPS Take by mouth.    . Potassium Gluconate 550 MG TABS Take by mouth.    . pyridoxine (B-6) 100 MG tablet Take 100 mg by mouth daily.    Marland Kitchen zinc gluconate 50 MG tablet Take 50 mg by mouth daily.     No current facility-administered medications on file prior to visit.      Past Medical History:  Diagnosis Date  . Headache    Allergies  Allergen Reactions  . Aricept [Donepezil Hcl] Other (See Comments)    "giving stomach and digestive problems"  . Sulfonamide Derivatives     REACTION: Swollen joints    Social History   Social History  . Marital status: Married    Spouse name: [name]   . Number of children: 3  . Years of education: 61   Social History Main Topics  . Smoking status: Former Research scientist (life sciences)  . Smokeless tobacco: Never Used  . Alcohol use Yes     Comment: wine  . Drug use: No  . Sexual activity: Not Asked   Other Topics Concern  . None   Social History Narrative   Lives at home with husband   Caffeine use: Drinks coffee/tea     Vitals:   10/02/16 1155  BP: 122/80  Pulse: 78  Resp: 12   Body mass index is 23.04 kg/m.   Physical Exam  Nursing note and vitals reviewed. Constitutional: She appears well-developed and well-nourished. She does not appear ill. No distress.   HENT:  Head: Atraumatic.  Mouth/Throat: Oropharynx is clear and moist and mucous membranes are normal.  No tenderness upon palpation of TMJ bilateral or temporal area.  Eyes: Conjunctivae and EOM are normal.  Neck: No JVD present. No muscular tenderness present. Carotid bruit is present (? Right carotid bruit.). No tracheal deviation present. No thyroid mass and no thyromegaly present.  Cardiovascular: Normal rate and regular rhythm.   No murmur heard. Respiratory: Effort normal. No respiratory distress.  Musculoskeletal: She exhibits no edema or tenderness.  Lymphadenopathy:    She has no cervical adenopathy.  Neurological: She is alert. She has normal strength. She displays tremor. Gait normal.  Difficulty finding words.  Skin: Skin is warm. No erythema.  Psychiatric: Her mood appears anxious. She exhibits abnormal recent memory.  Well groomed, good eye contact.      ASSESSMENT AND PLAN:   Bijou was seen today for discuss eye  exam.  Diagnoses and all orders for this visit:   Blurry vision, left eye  We discussed possible etiologies, including dry eye, eye disease (retina or intraocular pressure),vasculitis, or PAD among some. Hx does not seem to suggest amaurosis fugax. She had a head CT,ESR, and CRP in 07/2016, not suggestive of inflammation or acute process. She prefers to hold on further studies or work-up, will keep appt with ophthalmologists. Instructed about warning signs.  Right carotid bruit  We discussed CV risk, Dx, and management if in fact there is an hemodynamically significant carotid artery stenosis. She and her husband prefer to hold on carotic u/s. Instructed about warning signs.   Nonintractable headache, unspecified chronicity pattern, unspecified headache type  Improving. Some side effects of Gabapentin discussed. She will keep appt with neurologists.  Generalized anxiety disorder  Husband prefers to hold on pharmacologic treatment, some  options and side effects discussed.    -[name] and her husband were advised to return or notify a doctor immediately if symptoms worsen or new concerns arise. I will see her back in 11/2016.        G. [name], MD  [facility]. [place] office.

## 2016-10-02 ENCOUNTER — Ambulatory Visit (INDEPENDENT_AMBULATORY_CARE_PROVIDER_SITE_OTHER): Payer: Medicare Other | Admitting: Family Medicine

## 2016-10-02 ENCOUNTER — Encounter: Payer: Self-pay | Admitting: Family Medicine

## 2016-10-02 VITALS — BP 122/80 | HR 78 | Resp 12 | Ht 62.5 in | Wt 128.0 lb

## 2016-10-02 DIAGNOSIS — H538 Other visual disturbances: Secondary | ICD-10-CM

## 2016-10-02 DIAGNOSIS — R0989 Other specified symptoms and signs involving the circulatory and respiratory systems: Secondary | ICD-10-CM

## 2016-10-02 DIAGNOSIS — F411 Generalized anxiety disorder: Secondary | ICD-10-CM

## 2016-10-02 DIAGNOSIS — R51 Headache: Secondary | ICD-10-CM | POA: Diagnosis not present

## 2016-10-02 DIAGNOSIS — R519 Headache, unspecified: Secondary | ICD-10-CM

## 2016-10-02 NOTE — Progress Notes (Signed)
Pre visit review using our clinic review tool, if applicable. No additional management support is needed unless otherwise documented below in the visit note. 

## 2016-10-02 NOTE — Patient Instructions (Signed)
A few things to remember from today's visit:   Blurry vision, left eye  Generalized anxiety disorder  Nonintractable headache, unspecified chronicity pattern, unspecified headache type  Fall precautions.  Keep appointment with neurologists.  Will follow next visit.   Please be sure medication list is accurate. If a new problem present, please set up appointment sooner than planned today.

## 2016-11-16 ENCOUNTER — Encounter: Payer: Self-pay | Admitting: Family Medicine

## 2016-11-17 NOTE — Progress Notes (Signed)
HPI:   Kathryn [name] is a 83 y.o. female, who is here today with her husband to follow on some concerns today.  She was here last on 10/02/16. She has been here a few times addressing some concerns about abdominal discomfort, visual symptoms, wt gain, hearing loss , ear "wax" problems, memory getting worse among some. She has no concerns today but her husband does. He recently he sent another message expressing his concerned about her "mental decline" Her husband wanted to have a conversation with me alone before visit with Ms Danielsen.   He is concerned about Ms Zeiders "losing control",not being able to keep up with some house chores: could not use washer machine a couple days ago and one time she turned on gas stove without fire it and left it on. She does not cook,usually she fixes breakfast and lunch.  Husband monitor her daily meds.  Husband tells me that he has his own health issues and afraid he will not be able to continue taking care of Ms Kolker. He is trying to convince her to move to a smaller house,so it would be easier for both to keep up with the daily chores.    She has Hx of Alzheimer's dementia and follows with Dr [name]. She has not tolerated  His understanding about her illness is that it is chronic and she will continue to decline.   HTN:  Her BP is elevated today. She is on non pharmacologic treatment. She does not check BP at home.  Denies headache, visual changes, chest pain, dyspnea, palpitation, claudication, focal weakness, or edema.  Headaches she was having a few months ago have resolved,she is on Gabapentin 100 mg tid.,tolerating medication well.Husband has also noted that she is not as nervous as she used to be.    She follows a low salt diet in general, mentions that she eats a ham sandwich for lunch daily.   Review of Systems  Constitutional: Negative for appetite change, fatigue and fever.  HENT: Negative for mouth sores, nosebleeds and  trouble swallowing.   Eyes: Negative for pain and visual disturbance.  Respiratory: Negative for cough, shortness of breath and wheezing.   Cardiovascular: Negative for chest pain, palpitations and leg swelling.  Gastrointestinal: Negative for abdominal pain, nausea and vomiting.       Negative for changes in bowel habits.  Genitourinary: Negative for decreased urine volume, dysuria, frequency and hematuria.  Skin: Negative for rash.  Neurological: Negative for syncope, weakness and headaches.  Psychiatric/Behavioral: Negative for confusion and hallucinations. The patient is nervous/anxious.       Current Outpatient Prescriptions on File Prior to Visit  Medication Sig Dispense Refill  . Cholecalciferol (VITAMIN D3) 2000 UNITS TABS Take by mouth.    . Coenzyme Q10 (COQ10 PO) Take by mouth.    . cyanocobalamin 100 MCG tablet Take 100 mcg by mouth daily.    . folic acid (FOLVITE) 163 MCG tablet Take 400 mcg by mouth daily.    Marland Kitchen gabapentin (NEURONTIN) 100 MG capsule Take 1 capsule (100 mg total) by mouth 3 (three) times daily as needed. 90 capsule 11  . Grape Seed Extract 100 MG CAPS Take by mouth.    . NON FORMULARY MK-7 90 meq    . Omega-3 Fatty Acids (FISH OIL) 1000 MG CAPS Take by mouth.    . Potassium Gluconate 550 MG TABS Take by mouth.    . pyridoxine (B-6) 100 MG tablet Take 100 mg  by mouth daily.    Marland Kitchen zinc gluconate 50 MG tablet Take 50 mg by mouth daily.     No current facility-administered medications on file prior to visit.      Past Medical History:  Diagnosis Date  . Alzheimer's dementia   . Anxiety   . Headache   . Hypertension    Allergies  Allergen Reactions  . Aricept [Donepezil Hcl] Other (See Comments)    "giving stomach and digestive problems"  . Sulfonamide Derivatives     REACTION: Swollen joints    Social History   Social History  . Marital status: Married    Spouse name: [name]   . Number of children: 3  . Years of education: 32   Social  History Main Topics  . Smoking status: Former Research scientist (life sciences)  . Smokeless tobacco: Never Used  . Alcohol use Yes     Comment: wine  . Drug use: No  . Sexual activity: Not Asked   Other Topics Concern  . None   Social History Narrative   Lives at home with husband   Caffeine use: Drinks coffee/tea     Vitals:   11/18/16 0944  BP: (!) 160/66  Pulse: 79  Resp: 12  Temp: 97.4 F (36.3 C)   Body mass index is 23.15 kg/m.   Physical Exam  Nursing note and vitals reviewed. Constitutional: She appears well-developed and well-nourished. She is cooperative. No distress.  HENT:  Head: Atraumatic.  Mouth/Throat: Oropharynx is clear and moist and mucous membranes are normal.  Eyes: Conjunctivae and EOM are normal.  Cardiovascular: Normal rate and regular rhythm.   No murmur heard. Respiratory: Effort normal. No respiratory distress.  Musculoskeletal: She exhibits no edema or tenderness.  Lymphadenopathy:    She has no cervical adenopathy.  Neurological: She is alert. She has normal strength.  No focal deficit appreciated, some difficulty finding words during conversation, at her baseline.Mildly unstable gait with no assistance.   Skin: Skin is warm. No erythema.  Psychiatric: Her mood appears anxious.  Well groomed, good eye contact.      ASSESSMENT AND PLAN:   Oveta was seen today for follow-up.  Diagnoses and all orders for this visit:  Alzheimer's dementia without behavioral disturbance, unspecified timing of dementia onset  I met with husband alone for about 10 min,we discussed Dx,treatment options,and prognosis. Planning is important and moving to a smaller house,ideally independent living, is a good idea. But it needs to be done before greater declining. She has not tolerated treatment. Husband may also want to consider getting another family member involve in her care to help him a few hours during the day. She will keep appt with Dr [name].   Essential  hypertension  Elevated today, rechecked 160/90. Monitor BP at home. Low salt diet recommended. Possible complications of elevated BP discussed. Husband will keep me inform about BP readings. F/U in 3-4 months.  Generalized anxiety disorder  Gabapentin has also helped with problem. No changes today. We will continue following.    -Ms. Christella Noa and her husband were advised to return sooner than planned today if new concerns arise.       Oswin Griffith G. [name], MD  [facility]. [place] office.

## 2016-11-18 ENCOUNTER — Encounter: Payer: Self-pay | Admitting: Family Medicine

## 2016-11-18 ENCOUNTER — Ambulatory Visit (INDEPENDENT_AMBULATORY_CARE_PROVIDER_SITE_OTHER): Payer: Medicare Other | Admitting: Family Medicine

## 2016-11-18 VITALS — BP 160/66 | HR 79 | Temp 97.4°F | Resp 12 | Ht 62.5 in | Wt 128.6 lb

## 2016-11-18 DIAGNOSIS — F411 Generalized anxiety disorder: Secondary | ICD-10-CM | POA: Diagnosis not present

## 2016-11-18 DIAGNOSIS — G309 Alzheimer's disease, unspecified: Secondary | ICD-10-CM | POA: Diagnosis not present

## 2016-11-18 DIAGNOSIS — I10 Essential (primary) hypertension: Secondary | ICD-10-CM

## 2016-11-18 DIAGNOSIS — F0280 Dementia in other diseases classified elsewhere without behavioral disturbance: Secondary | ICD-10-CM | POA: Diagnosis not present

## 2016-11-18 NOTE — Patient Instructions (Addendum)
A few things to remember from today's visit:   Alzheimer's dementia without behavioral disturbance, unspecified timing of dementia onset  Essential hypertension  Generalized anxiety disorder  Blood pressure goal for most people is less than 140/90. Some populations (older than 60) the goal is less than 150/90.  Most recent cardiologists' recommendations recommend blood pressure at or less than 130/80.   Elevated blood pressure increases the risk of strokes, heart and kidney disease, and eye problems. Regular physical activity and a healthy diet (DASH diet) usually help. Low salt diet.   Caution with some over the counter medications as cold medications, dietary products (for weight loss), and Ibuprofen or Aleve (frequent use);all these medications could cause elevation of blood pressure.  Monitor blood pressure at home.  Please be sure medication list is accurate. If a new problem present, please set up appointment sooner than planned today.

## 2016-11-18 NOTE — Progress Notes (Signed)
Pre visit review using our clinic review tool, if applicable. No additional management support is needed unless otherwise documented below in the visit note. 

## 2016-12-01 ENCOUNTER — Encounter: Payer: Self-pay | Admitting: Neurology

## 2016-12-01 ENCOUNTER — Ambulatory Visit (INDEPENDENT_AMBULATORY_CARE_PROVIDER_SITE_OTHER): Payer: Medicare Other | Admitting: Neurology

## 2016-12-01 VITALS — BP 152/81 | HR 91 | Ht 62.5 in | Wt 127.2 lb

## 2016-12-01 DIAGNOSIS — G301 Alzheimer's disease with late onset: Secondary | ICD-10-CM

## 2016-12-01 DIAGNOSIS — F0280 Dementia in other diseases classified elsewhere without behavioral disturbance: Secondary | ICD-10-CM

## 2016-12-01 MED ORDER — RIVASTIGMINE TARTRATE 1.5 MG PO CAPS: 1.5000 mg | ORAL_CAPSULE | Freq: Two times a day (BID) | ORAL | 11 refills | Status: DC

## 2016-12-01 NOTE — Patient Instructions (Addendum)
Remember to drink plenty of fluid, eat healthy meals and do not skip any meals. Try to eat protein with a every meal and eat a healthy snack such as fruit or nuts in between meals. Try to keep a regular sleep-wake schedule and try to exercise daily, particularly in the form of walking, 20-30 minutes a day, if you can.   As far as your medications are concerned, I would like to suggest: Rivastigmine 1.5mg  twice daily  I would like to see you back in 6 months, sooner if we need to. Please call us with any interim questions, concerns, problems, updates or refill requests.   Our phone number is [phone]. We also have an after hours call service for urgent matters and there is a physician on-call for urgent questions. For any emergencies you know to call 911 or go to the nearest emergency room   Rivastigmine capsules What is this medicine? RIVASTIGMINE (ri va STIG meen) is used to treat mild to moderate dementia caused by Alzheimer's disease or Parkinson's disease. This medicine may be used for other purposes; ask your health care provider or pharmacist if you have questions. COMMON BRAND NAME(S): Exelon What should I tell my health care provider before I take this medicine? They need to know if you have any of these conditions: -difficulty passing urine -heart disease, or irregular or slow heartbeat -kidney disease -liver disease -lung or breathing disease, like asthma -seizures -stomach or intestine disease, ulcers, or stomach bleeding -an unusual or allergic reaction to rivastigmine, other medicines, foods, dyes, or preservatives -pregnant or trying to get pregnant -breast-feeding How should I use this medicine? Take this medicine by mouth with a glass of water. Follow the directions on the prescription label. Take this medicine with food. Take your doses at regular intervals. Do not take your medicine more often than directed. Do not stop taking except on the advice of your doctor or  health care professional. Talk to your pediatrician regarding the use of this medicine in children. Special care may be needed. Overdosage: If you think you have taken too much of this medicine contact a poison control center or emergency room at once. NOTE: This medicine is only for you. Do not share this medicine with others. What if I miss a dose? If you miss a dose, take it as soon as you can. If it is almost time for your next dose, take only that dose. Do not take double or extra doses. What may interact with this medicine? -antihistamines for allergy, cough and cold -atropine -certain medicines for bladder problems like oxybutynin, tolterodine -certain medicines for Parkinson's disease like benztropine, trihexyphenidyl -certain medicines for stomach problems like dicyclomine, hyoscyamine -glycopyrrolate -ipratropium -certain medicines for travel sickness like scopolamine -medicines that relax your muscles for surgery -other medicines for Alzheimer's disease This list may not describe all possible interactions. Give your health care provider a list of all the medicines, herbs, non-prescription drugs, or dietary supplements you use. Also tell them if you smoke, drink alcohol, or use illegal drugs. Some items may interact with your medicine. What should I watch for while using this medicine? Visit your doctor or health care professional for regular checks on your progress. Check with your doctor or health care professional if your symptoms do not get better or if they get worse. You may get drowsy or dizzy. Do not drive, use machinery, or do anything that needs mental alertness until you know how this drug affects you. What side effects may  I notice from receiving this medicine? Side effects that you should report to your doctor or health care professional as soon as possible: -allergic reactions like skin rash, itching or hives, swelling of the face, lips, or tongue -changes in vision or  balance -feeling faint or lightheaded, falls -increase in frequency of passing urine, or incontinence -nervousness, agitation, or increased confusion -redness, blistering, peeling or loosening of the skin, including inside the mouth -severe diarrhea -slow heartbeat, or palpitations -stomach pain -sweating -uncontrollable movements -vomiting -weight loss Side effects that usually do not require medical attention (report to your doctor or health care professional if they continue or are bothersome): -headache -indigestion or heartburn -loss of appetite -mild diarrhea, especially when starting treatment -nausea This list may not describe all possible side effects. Call your doctor for medical advice about side effects. You may report side effects to FDA at 1-800-FDA-1088. Where should I keep my medicine? Keep out of reach of children. Store at room temperature between 15 and 30 degrees C (59 and 86 degrees F). Keep container tightly closed. Throw away any unused medicine after the expiration date. NOTE: This sheet is a summary. It may not cover all possible information. If you have questions about this medicine, talk to your doctor, pharmacist, or health care provider.  2018 [employer]vier/Gold Standard (2008-03-20 14:15:23)

## 2016-12-01 NOTE — Progress Notes (Addendum)
[facility]   Provider: Dr [name] Referring Provider: Kennyth Arnold, FNP Primary Care Physician: Kennyth Arnold, FNP  CC: Memory changes and headaches  Interval history: Headaches have improved. Need to restart Aricept and then consider Namenda.   Interval history 09/04/2016: Patient continues to have headaches. CT of the head was negative for acute intracranial abnormality. She has daily headaches. She was taking up to 7 tylenol daily and I advised him to stop as this can cause significant side effects including rebound headaches. No falls or head injury. She has a difficult time describing the headaches due to dementia. We stopped Aricept to see if this would help but it has not made any changes. They were using the tylenol and they stopped the last 2 days. The last 2 days she has not taken tylenol and she has not mentioned the headache. No double vision or neck pain. CRP and sed rate were normal. I have advised no OTC medications more than 2-3x a week to avoid rebound headaches. No previous history of migraines. Headache is in the temple areas. No vision changes or other signs of temporal arteritis. They are going to see the eye doctor. She also stopped all supplements and they are slowly adding them back. I advised we don;t exactly know what is in all these supplements and I advise them to stop if there are headaches. She has been better these last 2 days and I advised if it continues/worsens we can order an MRI brain. Headaches are with activity. A home nurse took blood pressure about 140. She has fallen twice she sits in a chair and she got up and turned too fast and fell on the floor. We could also try low dose neurontin. I recommended PT in the home for gait and safety and a home safety study and they declined, I advised safety precautions for fall risks. Husband thinks stress may be causing the headache due to her brother's health problems and recent illness, her son has  also been in the hospital twice in the last 3 months for alcoholism. Husband takes notes today.   Interval history: 03/03/2016: Here for follow up of alzheimer's dementia. Repeat moCA 12/30 (last 14/30). She is on Aricept, did not tolerate the Namenda. B12 normal. Last TSH WNL. Here with husband. She does chair yoga. Her blood pressures at home have been normal avg 133/70, elevation in the officelikely from anxiety. She is sleeping well. There is some stress with patient's daughter which is better. They have sons with substance abuse and this has improved as well. She still sees her high school friends from [place] her HS at [employer] 4x a year, no behavioral prolems, she is lovely.   Interval history 09/04/2015: She is feeling better. She is having more bowel movements with the namenda. No diarrhea. It is not causing her discomfort, she is not dehydrated, she doesn't drink a lot of coffee, she has a great diet full of fiber. Anxiety level is better. She has projects to do at home, she is remaining active daily. She is here with her husband.   Reviewed MRi of the brain: IMPRESSION: This is an abnormal MRI of the brain without contrast showed the following: 1. Moderate generalized cortical atrophy that is most pronounced in the mesial temporal lobes. 2. Several T2/FLAIR hyperintense foci in the deep and subcortical white matter consistent with chronic microvascular ischemic changes. The extent is typical for age.  B12 and folate normal.   HPI: Kathryn Sobiech  [name] is a 83 y.o. female here as a referral from Dr. Justin Mend for memory loss. PMHx anxiety, HTN, fatige, headache. She lives with her husband in a single-family home. Husband is here with her. She functions well at home per patient, she cooks and likes to do yard work. She watches the news dails and enjoys watching movies. Patient says she forgets things at the store. Husband noticed the memory changes which started last October/November when they  were dealing with bed bugs. Husband says directions were difficult for patient, he had to watch patient closely to make sure she followed directions. Previous to this the changes were subtle, she would forget little things. There has been a decline in memory since then. Slowly progressive. She cooks, cleans, she drives. One time the stove was left on but otherwise no accidents in the home or in the car. She doesn't drive far, she doesn't get lost. She knows where to go around her house. She can make dinner fine. One time she forgot to add an ingredient in dinner but that was just an oversight. She is sleeping well. She writes down appointments and keep appointment cards. She has forgotten one appointment a long time ago. Husband does the finances, patient used to do the finances but husband took over because patient couldn't keep up with it. She was forgetting to pay bills. No behavioral changes except sometimes she gets aggravated at herself. No hallucinations or delusions. Mother with Alzheimers at about age 27. Husband is becoming concerned with wife's driving. She goes to yoga every week. No other focal neurologic symptoms. Older memories are intact. More recent memories impaired.   Reviewed notes, labs and imaging from outside physicians, which showed: patient was referred for evaluation of alzheimer's dementia. CMP unremarkable. TSH wnl.   Review of Systems: Patient complains of symptoms per HPI as well as the following symptoms: No fever, no chills, no CP, no sob. Pertinent negatives per HPI. All others negative.   Social History   Social History  . Marital status: Married    Spouse name: [name]   . Number of children: 3  . Years of education: 12   Occupational History  . Not on file.   Social History Main Topics  . Smoking status: Former Research scientist (life sciences)  . Smokeless tobacco: Never Used  . Alcohol use Yes     Comment: wine  . Drug use: No  . Sexual activity: Not on file   Other Topics  Concern  . Not on file   Social History Narrative   Lives at home with husband   Caffeine use: Drinks coffee/tea     Family History  Problem Relation Age of Onset  . Thyroid disease Mother   . Alzheimer's disease Mother   . Dementia Mother   . Hearing loss Brother   . Hearing loss Brother   . Heart Problems Brother     Past Medical History:  Diagnosis Date  . Alzheimer's dementia   . Anxiety   . Headache   . Hypertension     Past Surgical History:  Procedure Laterality Date  . BREAST BIOPSY      Current Outpatient Prescriptions  Medication Sig Dispense Refill  . Cholecalciferol (VITAMIN D3) 2000 UNITS TABS Take by mouth.    . Coenzyme Q10 (COQ10 PO) Take by mouth.    . cyanocobalamin 100 MCG tablet Take 100 mcg by mouth daily.    . folic acid (FOLVITE) 409 MCG tablet Take 400 mcg by mouth daily.    Marland Kitchen  gabapentin (NEURONTIN) 100 MG capsule Take 1 capsule (100 mg total) by mouth 3 (three) times daily as needed. 90 capsule 11  . Grape Seed Extract 100 MG CAPS Take by mouth.    . NON FORMULARY MK-7 90 meq    . Omega-3 Fatty Acids (FISH OIL) 1000 MG CAPS Take by mouth.    . Potassium Gluconate 550 MG TABS Take by mouth.    . pyridoxine (B-6) 100 MG tablet Take 100 mg by mouth daily.    Marland Kitchen zinc gluconate 50 MG tablet Take 50 mg by mouth daily.     No current facility-administered medications for this visit.     Allergies as of 12/01/2016 - Review Complete 12/01/2016  Allergen Reaction Noted  . Aricept [donepezil hcl] Other (See Comments) 09/04/2016  . Sulfonamide derivatives  09/23/2010    Vitals: BP (!) 152/81   Pulse 91   Ht 5' 2.5" (1.588 m)   Wt 127 lb 3.2 oz (57.7 kg)   BMI 22.89 kg/m  Last Weight:  Wt Readings from Last 1 Encounters:  12/01/16 127 lb 3.2 oz (57.7 kg)   Last Height:   Ht Readings from Last 1 Encounters:  12/01/16 5' 2.5" (1.588 m)   MMSE - Mini Mental State Exam 12/01/2016  Orientation to time 1  Orientation to Place 3    Registration 3  Attention/ Calculation 0  Recall 0  Language- name 2 objects 2  Language- repeat 1  Language- follow 3 step command 3  Language- read & follow direction 1  Write a sentence 0  Copy design 0  Total score 14   Montreal Cognitive Assessment  07/10/2016 03/03/2016 05/28/2015  Visuospatial/ Executive (0/5) 0 1 0  Naming (0/3) 3 3 3   Attention: Read list of digits (0/2) 2 2 2   Attention: Read list of letters (0/1) 1 0 0  Attention: Serial 7 subtraction starting at 100 (0/3) 0 0 0  Language: Repeat phrase (0/2) 2 2 1   Language : Fluency (0/1) 0 0 0  Abstraction (0/2) 1 0 1  Delayed Recall (0/5) 0 0 0  Orientation (0/6) 4 3 6   Total 13 11 13   Adjusted Score (based on education) 13 12 14      Cranial Nerves:  The pupils are equal, round, and reactive to light. Visual fields are full to finger confrontation. Extraocular movements are intact. Trigeminal sensation is intact and the muscles of mastication are normal. The face is symmetric. The palate elevates in the midline. Hearing intact. Voice is normal. Shoulder shrug is normal. The tongue has normal motion without fasciculations.   Motor Observation:  No asymmetry, no atrophy, and no involuntary movements noted. Tone:  Normal muscle tone.   Posture:  Posture is normal. normal erect   Strength:  Strength is V/V in the upper and lower limbs.    Sensation: intact to LT   Reflex Exam:  DTR's:  Deep tendon reflexes in the upper and lower extremities are normal bilaterally.  Toes: left upgoing Clonus:  Clonus is absent.     Assessment/Plan: This is a very lovely 83 year old female with memory loss. MoCA 14/30, MMSE 14/30. Likely Alzheimer's type dementia. MRi of the brain showed moderate generalized cortical atrophy that is most pronounced in the mesial temporal lobes and several T2/FLAIR hyperintense foci in the deep and subcortical white matter consistent with chronic  microvascular ischemic changes. The extent is typical for age. B12, folate and TSH WNL. She has several months of headaches in  the setting of stress, CT of the head negative.  Did not tolerate Aricept, may consider Exelon. Will try Exelon very low dose and slowly increase. Asked them to email me in a month and if tolerated will increase to 3mg  twice daily. Did not tolerate the Namenda 10mg  twice daily.  No Driving As far as your medications are concerned, I would like to suggest Gabapentin up to 3x a day as needed for headache - headache improved, discussed medication overuse (was taking tylenol daily inb the past) As far as diagnostic testing: MRI of the brain if needed if headache retuen. There was a component of medication overuse and they have stopped using OTC multiple times a day will follow Discussed dementia, progressive disorder, will return in 6 months.  Sarina Ill, MD  [facility] [address] [place], [place] [address]  Phone [phone] Fax [phone]  A total of 25 minutes was spent face-to-face with this patient. Over half this time was spent on counseling patient on the alzheimers dementia diagnosis and different diagnostic and therapeutic options available.

## 2017-02-16 NOTE — Progress Notes (Signed)
HPI:   Kathryn [name] is a 83 y.o. female, who is here today with her husband for 3-4 months follow-up.  I last saw her on 11/18/16.  Hx of Alzheimer's dementia.  Since her last OV she has followed up with neuro, Dr [name]. Exelon was added since her last OV. She is still taking Gabapentin 100 mg tid for headache.  She is tolerating Exelon well, has not noted side effects so far. According to husband, her sleep pattern is "good" now and appetite has improved.  He is arranging her meds for the week and "monitoring" that she is taking them. She makes breakfast and lunch, usually meals that do not involve cooking on stove. Husbands cooks dinner.  Anxiety: According to husband, it has improved since family is not visiting. Husband has not noted depressed mood.   HTN and CKD III, she is on non pharmacologic treatment. Home BP's: SBP's most in the 120's-130's, a few times 140's and rarely 150's. DBP 70-80's.  She is following low salt diet.  Lab Results  Component Value Date   CREATININE 0.99 09/11/2016   BUN 12 09/11/2016   NA 143 09/11/2016   K 4.3 09/11/2016   CL 105 09/11/2016   CO2 29 09/11/2016     Concerns today:   Husband mentions 2 days of constipation. She tells me that she had a "normal" bowel movement today. He is not sure how often she has bowel movements. She attributes constipation to changes in her diet around July 4th.  Denies abdominal pain, nausea, vomiting, blood in stool or melena. Denies dysuria,increased urinary frequency, gross hematuria,or decreased urine output.   Review of Systems  Constitutional: Negative for activity change, fatigue and fever.  HENT: Negative for mouth sores, nosebleeds and trouble swallowing.   Eyes: Negative for redness and visual disturbance.  Respiratory: Negative for shortness of breath and wheezing.   Cardiovascular: Negative for chest pain, palpitations and leg swelling.  Gastrointestinal: Positive for  constipation. Negative for abdominal pain, nausea and vomiting.  Endocrine: Negative for cold intolerance and heat intolerance.  Genitourinary: Negative for decreased urine volume and hematuria.  Neurological: Negative for syncope, weakness and headaches.  Psychiatric/Behavioral: Negative for hallucinations and sleep disturbance. The patient is nervous/anxious.       Current Outpatient Prescriptions on File Prior to Visit  Medication Sig Dispense Refill  . Cholecalciferol (VITAMIN D3) 2000 UNITS TABS Take by mouth.    . Coenzyme Q10 (COQ10 PO) Take by mouth.    . cyanocobalamin 100 MCG tablet Take 100 mcg by mouth daily.    . folic acid (FOLVITE) 443 MCG tablet Take 400 mcg by mouth daily.    Marland Kitchen gabapentin (NEURONTIN) 100 MG capsule Take 1 capsule (100 mg total) by mouth 3 (three) times daily as needed. 90 capsule 11  . Grape Seed Extract 100 MG CAPS Take by mouth.    . NON FORMULARY MK-7 90 meq    . Omega-3 Fatty Acids (FISH OIL) 1000 MG CAPS Take by mouth.    . Potassium Gluconate 550 MG TABS Take by mouth.    . pyridoxine (B-6) 100 MG tablet Take 100 mg by mouth daily.    . rivastigmine (EXELON) 1.5 MG capsule Take 1 capsule (1.5 mg total) by mouth 2 (two) times daily. 60 capsule 11  . zinc gluconate 50 MG tablet Take 50 mg by mouth daily.     No current facility-administered medications on file prior to visit.  Past Medical History:  Diagnosis Date  . Alzheimer's dementia   . Anxiety   . Headache   . Hypertension    Allergies  Allergen Reactions  . Aricept [Donepezil Hcl] Other (See Comments)    "giving stomach and digestive problems"  . Sulfonamide Derivatives     REACTION: Swollen joints    Social History   Social History  . Marital status: Married    Spouse name: [name]   . Number of children: 3  . Years of education: 46   Social History Main Topics  . Smoking status: Former Research scientist (life sciences)  . Smokeless tobacco: Never Used  . Alcohol use Yes     Comment: wine   . Drug use: No  . Sexual activity: Not Asked   Other Topics Concern  . None   Social History Narrative   Lives at home with husband   Caffeine use: Drinks coffee/tea     Vitals:   02/17/17 1112  BP: 130/82  Pulse: 89  Resp: 12   Body mass index is 22.77 kg/m.  Wt Readings from Last 3 Encounters:  02/17/17 126 lb 8 oz (57.4 kg)  12/01/16 127 lb 3.2 oz (57.7 kg)  11/18/16 128 lb 9.6 oz (58.3 kg)     Physical Exam  Nursing note and vitals reviewed. Constitutional: She appears well-developed. No distress.  HENT:  Head: Atraumatic.  Mouth/Throat: Oropharynx is clear and moist and mucous membranes are normal.  Eyes: Pupils are equal, round, and reactive to light. Conjunctivae and EOM are normal.  Cardiovascular: Normal rate and regular rhythm.   No murmur heard. Pulses:      Dorsalis pedis pulses are 2+ on the right side, and 2+ on the left side.  Respiratory: Effort normal and breath sounds normal. No respiratory distress.  GI: Soft. Bowel sounds are normal. She exhibits no mass. There is no hepatomegaly. There is no tenderness.  Musculoskeletal: She exhibits no edema or tenderness.  Lymphadenopathy:    She has no cervical adenopathy.  Neurological: She is alert. She has normal strength. She displays tremor.  She has trouble finding words and gets frustrated at times. Stable gait, not assisted.  Skin: Skin is warm. No erythema.  Psychiatric: Her mood appears anxious.  Well groomed, good eye contact.    ASSESSMENT AND PLAN:   Ms Dian was seen today for follow-up.  Diagnoses and all orders for this visit:  Constipation, unspecified constipation type  She has Hx of constipation causing abdominal bloating and discomfort, which improved with eating prunes (09/11/16 visit). She is reporting today that she seems to be doing better. Adequate fiber and water intake. Instructed about warning signs. OTC Miralax can be used if needed.   Essential hypertension  A  few elevated BP's but in general adequate BP's. Continue low salt diet. Monitor BP periodically.  CKD (chronic kidney disease), stage III  Low salt diet,adequate hydration,and avoid NSAID's. Planning on labs next OV.  Generalized anxiety disorder  Stable. She is not interested in pharmacologic treatment for now.   Alzheimer's dementia without behavioral disturbance, unspecified timing of dementia onset  Tolerating Exelon well. Continue following with Dr [name].  Education about disease and prognosis.   Husband would like to follow in 4 months.   -Ms. AAMORI MCMASTERS was advised to return sooner than planned today if new concerns arise.       Lesslie Mossa G. [name], MD  [facility]. [place] office.

## 2017-02-17 ENCOUNTER — Ambulatory Visit (INDEPENDENT_AMBULATORY_CARE_PROVIDER_SITE_OTHER): Payer: Medicare Other | Admitting: Family Medicine

## 2017-02-17 ENCOUNTER — Encounter: Payer: Self-pay | Admitting: Family Medicine

## 2017-02-17 VITALS — BP 130/82 | HR 89 | Resp 12 | Ht 62.5 in | Wt 126.5 lb

## 2017-02-17 DIAGNOSIS — F411 Generalized anxiety disorder: Secondary | ICD-10-CM

## 2017-02-17 DIAGNOSIS — N183 Chronic kidney disease, stage 3 unspecified: Secondary | ICD-10-CM

## 2017-02-17 DIAGNOSIS — F0280 Dementia in other diseases classified elsewhere without behavioral disturbance: Secondary | ICD-10-CM | POA: Diagnosis not present

## 2017-02-17 DIAGNOSIS — I10 Essential (primary) hypertension: Secondary | ICD-10-CM | POA: Diagnosis not present

## 2017-02-17 DIAGNOSIS — G309 Alzheimer's disease, unspecified: Secondary | ICD-10-CM | POA: Diagnosis not present

## 2017-02-17 DIAGNOSIS — K5900 Constipation, unspecified: Secondary | ICD-10-CM

## 2017-02-17 NOTE — Patient Instructions (Signed)
A few things to remember from today's visit:   Essential hypertension  CKD (chronic kidney disease), stage III  Generalized anxiety disorder   Kathryn [name], today we have followed on some of your chronic medical problems and they seem to be stable, so no changes in current management today.  Review medication list and be sure it is accurate.  -Remember a healthy diet and regular physical activity are very important for prevention as well as for well being; they also help with many chronic problems, decreasing the need of adding new medications and delaying or preventing possible complications.  I will see you back in 6 months.  Remember to arrange your follow up appt before leaving today.  Please follow sooner than planned if a new concern arises.   If a new problem present, please set up appointment sooner than planned today.

## 2017-03-05 ENCOUNTER — Encounter: Payer: Self-pay | Admitting: Neurology

## 2017-03-08 ENCOUNTER — Other Ambulatory Visit: Payer: Self-pay | Admitting: Neurology

## 2017-03-08 MED ORDER — RIVASTIGMINE TARTRATE 3 MG PO CAPS: 3.0000 mg | ORAL_CAPSULE | Freq: Two times a day (BID) | ORAL | 11 refills | Status: DC

## 2017-04-02 ENCOUNTER — Encounter: Payer: Self-pay | Admitting: Neurology

## 2017-04-02 ENCOUNTER — Ambulatory Visit (INDEPENDENT_AMBULATORY_CARE_PROVIDER_SITE_OTHER): Payer: Medicare Other | Admitting: Neurology

## 2017-04-02 ENCOUNTER — Other Ambulatory Visit: Payer: Self-pay | Admitting: Neurology

## 2017-04-02 VITALS — BP 157/82 | HR 73 | Ht 62.5 in | Wt 125.2 lb

## 2017-04-02 DIAGNOSIS — G301 Alzheimer's disease with late onset: Secondary | ICD-10-CM

## 2017-04-02 DIAGNOSIS — F0280 Dementia in other diseases classified elsewhere without behavioral disturbance: Secondary | ICD-10-CM | POA: Diagnosis not present

## 2017-04-02 MED ORDER — RIVASTIGMINE TARTRATE 4.5 MG PO CAPS: 4.5000 mg | ORAL_CAPSULE | Freq: Two times a day (BID) | ORAL | 11 refills | Status: DC

## 2017-04-02 NOTE — Progress Notes (Addendum)
[facility]    Provider:  Dr [name] Referring Provider: [name], [name], MD Primary Care Physician:  [name], [name], MD  CC: Memory changes and headaches  Interval history 04/02/2017: Patient is here with husband for follow-up. Husband is asking my opinion on whether patient has capacity to make decisions regarding estate planning. She has difficulty due to dementia expressing but she has a basic understanding when trying to explain POA and beneficiaries - But I do think she understands when asked and explained she grasps the concepts and is able to agree or decline. If you ask her, she is able to express her wishes for inheritance, POA and other legal issues. She is very clear on her thoughts on inheritance and family members she would like to be included and how her estate should be dispersed. She also is also in agreement with her husband and has no reservations about his decision making and his concern for her well-being.  Interval history 09/04/2016: Patient continues to have headaches. CT of the head was negative for acute intracranial abnormality. She has daily headaches. She was taking up to 7 tylenol daily and I advised him to stop as this can cause significant side effects including rebound headaches. No falls or head injury. She has a difficult time describing the headaches due to dementia. We stopped Aricept to see if this would help but it has not made any changes. They were using the tylenol and they stopped the last 2 days. The last 2 days she has not taken tylenol and she has not mentioned the headache. No double vision or neck pain. CRP and sed rate were normal. I have advised no OTC medications more than 2-3x a week to avoid rebound headaches. No previous history of migraines. Headache is in the temple areas. No vision changes or other signs of temporal arteritis. They are going to see the eye doctor. She also stopped all supplements and they are slowly adding them  back. I advised we don;t exactly know what is in all these supplements and I advise them to stop if there are headaches. She has been better these last 2 days and I advised if it continues/worsens we can order an MRI brain. Headaches are with activity. A home nurse took blood pressure about 140. She has fallen twice she sits in a chair and she got up and turned too fast and fell on the floor. We could also try low dose neurontin. I recommended PT in the home for gait and safety and a home safety study and they declined, I advised safety precautions for fall risks. Husband thinks stress may be causing the headache due to her brother's health problems and recent illness, her son has also been in the hospital twice in the last 3 months for alcoholism. Husband takes notes today.   Interval history: 03/03/2016: Here for follow up of alzheimer's dementia. Repeat moCA 12/30 (last 14/30). She is on Aricept, did not tolerate the Namenda. B12 normal. Last TSH WNL. Here with husband. She does chair yoga. Her blood pressures at home have been normal avg 133/70, elevation in the officelikely from anxiety. She is sleeping well. There is some stress with patient's daughter which is better. They have sons with substance abuse and this has improved as well. She still sees her high school friends from [place] her HS at [employer] 4x a year, no behavioral prolems, she is lovely.   Interval history 09/04/2015: She is feeling better. She is having more  bowel movements with the namenda. No diarrhea. It is not causing her discomfort, she is not dehydrated, she doesn't drink a lot of coffee, she has a great diet full of fiber. Anxiety level is better. She has projects to do at home, she is remaining active daily. She is here with her husband.   Reviewed MRi of the brain: IMPRESSION: This is an abnormal MRI of the brain without contrast showed the following: 1. Moderate generalized cortical atrophy that is most pronounced in the  mesial temporal lobes. 2. Several T2/FLAIR hyperintense foci in the deep and subcortical white matter consistent with chronic microvascular ischemic changes. The extent is typical for age.  B12 and folate normal.   HPI: Kathryn [name] is a 83 y.o. female here as a referral from Dr. Justin Mend for memory loss. PMHx anxiety, HTN, fatige, headache. She lives with her husband in a single-family home. Husband is here with her. She functions well at home per patient, she cooks and likes to do yard work. She watches the news dails and enjoys watching movies. Patient says she forgets things at the store. Husband noticed the memory changes which started last October/November when they were dealing with bed bugs. Husband says directions were difficult for patient, he had to watch patient closely to make sure she followed directions. Previous to this the changes were subtle, she would forget little things. There has been a decline in memory since then. Slowly progressive. She cooks, cleans, she drives. One time the stove was left on but otherwise no accidents in the home or in the car. She doesn't drive far, she doesn't get lost. She knows where to go around her house. She can make dinner fine. One time she forgot to add an ingredient in dinner but that was just an oversight. She is sleeping well. She writes down appointments and keep appointment cards. She has forgotten one appointment a long time ago. Husband does the finances, patient used to do the finances but husband took over because patient couldn't keep up with it. She was forgetting to pay bills. No behavioral changes except sometimes she gets aggravated at herself. No hallucinations or delusions. Mother with Alzheimers at about age 32. Husband is becoming concerned with wife's driving. She goes to yoga every week. No other focal neurologic symptoms. Older memories are intact. More recent memories impaired.   Reviewed notes, labs and imaging from outside  physicians, which showed: patient was referred for evaluation of alzheimer's dementia. CMP unremarkable. TSH wnl.   Review of Systems: Patient complains of symptoms per HPI as well as the following symptoms: No fever, no chills, no CP, no sob. Pertinent negatives per HPI. All others negative.   Review of Systems: Patient complains of symptoms per HPI as well as the following symptoms: no agitation. Pertinent negatives and positives per HPI. All others negative.   Social History   Social History  . Marital status: Married    Spouse name: [name]   . Number of children: 3  . Years of education: 12   Occupational History  . Not on file.   Social History Main Topics  . Smoking status: Former Research scientist (life sciences)  . Smokeless tobacco: Never Used  . Alcohol use Yes     Comment: wine  . Drug use: No  . Sexual activity: Not on file   Other Topics Concern  . Not on file   Social History Narrative   Lives at home with husband   Caffeine use: Drinks coffee/tea  Family History  Problem Relation Age of Onset  . Thyroid disease Mother   . Alzheimer's disease Mother   . Dementia Mother   . Hearing loss Brother   . Hearing loss Brother   . Heart Problems Brother     Past Medical History:  Diagnosis Date  . Alzheimer's dementia   . Anxiety   . Headache   . Hypertension     Past Surgical History:  Procedure Laterality Date  . BREAST BIOPSY      Current Outpatient Prescriptions  Medication Sig Dispense Refill  . Cholecalciferol (VITAMIN D3) 2000 UNITS TABS Take by mouth.    . Coenzyme Q10 (COQ10 PO) Take by mouth.    . cyanocobalamin 100 MCG tablet Take 100 mcg by mouth daily.    . folic acid (FOLVITE) 790 MCG tablet Take 400 mcg by mouth daily.    Marland Kitchen gabapentin (NEURONTIN) 100 MG capsule Take 1 capsule (100 mg total) by mouth 3 (three) times daily as needed. 90 capsule 11  . Grape Seed Extract 100 MG CAPS Take by mouth.    . NON FORMULARY MK-7 90 meq    . Omega-3 Fatty Acids  (FISH OIL) 1000 MG CAPS Take by mouth.    . Potassium Gluconate 550 MG TABS Take by mouth.    . pyridoxine (B-6) 100 MG tablet Take 100 mg by mouth daily.    Marland Kitchen zinc gluconate 50 MG tablet Take 50 mg by mouth daily.    . rivastigmine (EXELON) 4.5 MG capsule Take 1 capsule (4.5 mg total) by mouth 2 (two) times daily. 60 capsule 11   No current facility-administered medications for this visit.     Allergies as of 04/02/2017 - Review Complete 04/02/2017  Allergen Reaction Noted  . Aricept [donepezil hcl] Other (See Comments) 09/04/2016  . Sulfonamide derivatives  09/23/2010    Vitals: BP (!) 157/82 (BP Location: Right Arm, Patient Position: Sitting, Cuff Size: Normal)   Pulse 73   Ht 5' 2.5" (1.588 m)   Wt 125 lb 3.2 oz (56.8 kg)   BMI 22.53 kg/m  Last Weight:  Wt Readings from Last 1 Encounters:  04/02/17 125 lb 3.2 oz (56.8 kg)   Last Height:   Ht Readings from Last 1 Encounters:  04/02/17 5' 2.5" (1.588 m)         Cranial Nerves:  The pupils are equal, round, and reactive to light. Visual fields are full to finger confrontation. Extraocular movements are intact. Trigeminal sensation is intact and the muscles of mastication are normal. The face is symmetric. The palate elevates in the midline. Hearing intact. Voice is normal. Shoulder shrug is normal. The tongue has normal motion without fasciculations.   Motor Observation:  No asymmetry, no atrophy, and no involuntary movements noted. Tone:  Normal muscle tone.   Posture:  Posture is normal. normal erect   Strength:  Strength is V/V in the upper and lower limbs.    Sensation: intact to LT   Reflex Exam:  DTR's:  Deep tendon reflexes in the upper and lower extremities are normal bilaterally.  Toes: left upgoing Clonus:  Clonus is absent.   MMSE - Mini Mental State Exam 12/01/2016  Orientation to time 1  Orientation to Place 3  Registration 3  Attention/ Calculation 0    Recall 0  Language- name 2 objects 2  Language- repeat 1  Language- follow 3 step command 3  Language- read & follow direction 1  Write a sentence 0  Copy  design 0  Total score 14     Assessment/Plan: This is a very lovely 83year old female with memory loss. MoCA 14/30, MMSE 14/30. Likely Alzheimer's type dementia. MRi of the brain showed moderate generalized cortical atrophy that is most pronounced in the mesial temporal lobes and several T2/FLAIR hyperintense foci in the deep and subcortical white matter consistent with chronic microvascular ischemic changes. The extent is typical for age. B12, folate and TSH WNL. She has several months of headaches in the setting of stress, CT of the head negative.  Did not tolerate Aricept, we will increase her Exelon. Did not tolerate the Namenda 10mg  twice daily.  No Driving  - Patient is here with husband for follow-up. Husband is asking my opinion on whether patient has capacity to make decisions regarding estate planning. She has difficulty due to dementia but she has a basic understanding when trying to explain POA and beneficiaries, I do think she  she grasps the concepts and is able to agree or decline with state planning decisions. She is able to express her wishes for inheritance, POA and other legal issues. She is very clear on her thoughts on inheritance and family members she would like to be included and how her estate should be dispersed. She also is also in agreement with her husband and has no reservations about his decision making and his concern for her well-being.   Discussed dementia, progressive disorder, will return in 6 months.  Sarina Ill, MD  [facility] [address] [place], [place] [address]  Phone [phone] Fax [phone]  A total of 15 minutes was spent face-to-face with this patient. Over half this time was spent on counseling patient on the alzheimers dementia  diagnosis and different diagnostic and therapeutic options available.

## 2017-04-03 ENCOUNTER — Encounter: Payer: Self-pay | Admitting: Neurology

## 2017-04-09 ENCOUNTER — Telehealth: Payer: Self-pay | Admitting: Neurology

## 2017-04-09 NOTE — Telephone Encounter (Signed)
Call :  Dr [name] - re Competency letter  I have been unable to reach my lawyer; left word with receptionist you will be contacting him.  His name: Romie Levee - [email or web address]  [address], [place], [place]. [address]. Tel# [phone]  Thanks again, CAN

## 2017-04-20 NOTE — Telephone Encounter (Signed)
Called left message with my cell number.

## 2017-04-23 ENCOUNTER — Encounter: Payer: Self-pay | Admitting: Family Medicine

## 2017-05-08 NOTE — Telephone Encounter (Signed)
Pt's husband called he was frustrated the letter for competency has not been sent to his lawyer yet. He understands Dr A and atty office have been missing calls but he is concerned if something happens to him, he doesn't know how his wife will be cared for. He said the letter can be sent to [place] (o) [phone] and address it: To whom it may concern. If Dr A has any questions please call Marlowe Kays or husband at [phone]

## 2017-05-09 NOTE — Telephone Encounter (Signed)
Missed attorneys call this weekend. I called and left a message on his voicemail again. Unfortunately it is difficult to take a call during the week because I am seeing patients all day or performing procedures and I am also in the hospital on weekends. But the good news is that I am out of the office but available Wednesday-Friday of this week and I am sure we can find a time to talk then as I am not seeing patients or in the hospital these days. Please let patient's husband know thanks.

## 2017-05-11 NOTE — Telephone Encounter (Signed)
I spoke with the pt's husband, [name]. I informed him that Dr. [name] has called and LVM on Attorney's phone and is awaiting a phone call for further steps r/t letter [name] is requesting.

## 2017-05-17 NOTE — Telephone Encounter (Signed)
I spoke to the attorney, I will address this next week when I am back in the office thanks.

## 2017-05-20 NOTE — Telephone Encounter (Signed)
I spoke to the attorney. Will draft a letter this week and mail it bu the beginning of next weke thanks. Let husband know.

## 2017-05-21 NOTE — Telephone Encounter (Signed)
Rn call patients husband [name] that the letter for attorney will be done per DR. [name]. Rn stated DR. [name] did speak to the attorney. Pt's husband verbalized understanding.

## 2017-05-24 ENCOUNTER — Encounter: Payer: Self-pay | Admitting: Neurology

## 2017-05-24 NOTE — Telephone Encounter (Signed)
Kathryn [name], I have composed a letter for patient. I can't print it, wuld you print letter for me please so I can sign it and give it to Hilda Blades to forward to pateint's husband and her attorney whose address is in the letter. Thanks!

## 2017-05-25 NOTE — Telephone Encounter (Signed)
Letter printed, awaiting AA,MD signature

## 2017-05-25 NOTE — Telephone Encounter (Signed)
Letter given to [facility] for sending

## 2017-06-02 ENCOUNTER — Ambulatory Visit (INDEPENDENT_AMBULATORY_CARE_PROVIDER_SITE_OTHER): Payer: Medicare Other

## 2017-06-02 DIAGNOSIS — Z23 Encounter for immunization: Secondary | ICD-10-CM

## 2017-06-08 ENCOUNTER — Ambulatory Visit: Payer: Medicare Other | Admitting: Neurology

## 2017-06-08 ENCOUNTER — Other Ambulatory Visit: Payer: Self-pay | Admitting: Neurology

## 2017-06-08 ENCOUNTER — Telehealth: Payer: Self-pay | Admitting: *Deleted

## 2017-06-08 MED ORDER — RIVASTIGMINE TARTRATE 6 MG PO CAPS: 6.0000 mg | ORAL_CAPSULE | Freq: Two times a day (BID) | ORAL | 11 refills | Status: DC

## 2017-06-08 NOTE — Telephone Encounter (Signed)
Patient had an appt today, but Dr. [name] said she does not need to see them since she just saw her in August. I called the patient and offered to reschedule. I spoke with her husband, Kathryn [name] (on [place]) and he verbalized understanding and was fine with rescheduling. The husband also spoke with Dr. [name] via telephone regarding his medication questions. The patient's new appt is Monday September 07, 2017 @ 3:00 pm with an arrival time of 2:30 pm. Kathryn [name] verbalized understanding.

## 2017-08-20 NOTE — Progress Notes (Signed)
HPI:   [name] is a 84 y.o. female, who is here today for 6 months follow up.   She was last seen on 02/17/17.  Since her last OV she has followed with her neurologist, Dr Kathryn [name].  Hx of Alzheimer's dementia and headaches.  Her husband helps to provide history.  Avoid all her husband since problem is a stable, she is able to do chores around the house, simple task.  She cooks under her husband's supervision.  She avoids working outdoors. She has not had a fall. Still has some episodes of confusion.   Anxiety: Exacerbated by family interactions,mainly with her children. She spent Christmas with her husband's family, including his ex-wife, which she really enjoyed. Her husband has not noted depressed mood.  CKD III, she has not noted gross hematuria or foam in urine. No decreased urine output.  HTN: Home BP readings: 130s/70s She is on non pharmacologic treatment. Last eye exam: Within the past year.  Lab Results  Component Value Date   CREATININE 0.99 09/11/2016   BUN 12 09/11/2016   NA 143 09/11/2016   K 4.3 09/11/2016   CL 105 09/11/2016   CO2 29 09/11/2016    Hyperlipidemia:  Her husband is also requesting lipid panel today.   Currently on nonpharmacologic treatment. Following a low fat diet: Yes.    Lab Results  Component Value Date   CHOL 212 (H) 03/15/2014   HDL 87.80 03/15/2014   LDLCALC 112 (H) 03/15/2014   TRIG 63.0 03/15/2014   CHOLHDL 2 03/15/2014     Review of Systems  Constitutional: Negative for activity change, appetite change, fatigue and fever.  HENT: Negative for mouth sores, nosebleeds and trouble swallowing.   Eyes: Negative for redness and visual disturbance.  Respiratory: Negative for cough, shortness of breath and wheezing.   Cardiovascular: Negative for chest pain, palpitations and leg swelling.  Gastrointestinal: Negative for abdominal pain, nausea and vomiting.       Negative for changes in bowel habits.    Endocrine: Negative for polydipsia, polyphagia and polyuria.  Genitourinary: Negative for decreased urine volume, dysuria and hematuria.  Musculoskeletal: Positive for gait problem. Negative for myalgias.  Skin: Negative for color change and rash.  Neurological: Negative for syncope, weakness and headaches.  Psychiatric/Behavioral: Positive for confusion (at her baseline). Negative for sleep disturbance. The patient is nervous/anxious.       Current Outpatient Medications on File Prior to Visit  Medication Sig Dispense Refill  . Cholecalciferol (VITAMIN D3) 2000 UNITS TABS Take by mouth.    . Coenzyme Q10 (COQ10 PO) Take by mouth.    . folic acid (FOLVITE) 742 MCG tablet Take 400 mcg by mouth daily.    Marland Kitchen gabapentin (NEURONTIN) 100 MG capsule Take 1 capsule (100 mg total) by mouth 3 (three) times daily as needed. 90 capsule 11  . NON FORMULARY MK-7 90 meq    . Omega-3 Fatty Acids (FISH OIL) 1000 MG CAPS Take by mouth.    . Potassium Gluconate 550 MG TABS Take by mouth.    . pyridoxine (B-6) 100 MG tablet Take 100 mg by mouth daily.    . rivastigmine (EXELON) 6 MG capsule Take 1 capsule (6 mg total) 2 (two) times daily by mouth. 60 capsule 11  . zinc gluconate 50 MG tablet Take 50 mg by mouth daily.    . cyanocobalamin 100 MCG tablet Take 100 mcg by mouth daily.    . Grape Seed Extract 100  MG CAPS Take by mouth.     No current facility-administered medications on file prior to visit.      Past Medical History:  Diagnosis Date  . Alzheimer's dementia   . Anxiety   . Headache   . Hypertension    Allergies  Allergen Reactions  . Aricept [Donepezil Hcl] Other (See Comments)    "giving stomach and digestive problems"  . Sulfonamide Derivatives     REACTION: Swollen joints    Social History   Socioeconomic History  . Marital status: Married    Spouse name: Kathryn [name]   . Number of children: 3  . Years of education: 55  . Highest education level: None  Social Needs  .  Financial resource strain: None  . Food insecurity - worry: None  . Food insecurity - inability: None  . Transportation needs - medical: None  . Transportation needs - non-medical: None  Occupational History  . None  Tobacco Use  . Smoking status: Former Research scientist (life sciences)  . Smokeless tobacco: Never Used  Substance and Sexual Activity  . Alcohol use: Yes    Comment: wine  . Drug use: No  . Sexual activity: None  Other Topics Concern  . None  Social History Narrative   Lives at home with husband   Caffeine use: Drinks coffee/tea     Vitals:   08/21/17 0759  BP: 130/77  Pulse: 75  Resp: 12  Temp: 97.6 F (36.4 C)  SpO2: 96%   Body mass index is 22.39 kg/m.   Physical Exam  Nursing note and vitals reviewed. Constitutional: She appears well-developed and well-nourished. No distress.  HENT:  Head: Normocephalic and atraumatic.  Mouth/Throat: Oropharynx is clear and moist and mucous membranes are normal.  Eyes: Conjunctivae are normal. Pupils are equal, round, and reactive to light.  Cardiovascular: Normal rate and regular rhythm.  No murmur heard. Pulses:      Dorsalis pedis pulses are 2+ on the right side, and 2+ on the left side.  Respiratory: Effort normal and breath sounds normal. No respiratory distress.  GI: Soft. She exhibits no mass. There is no hepatomegaly. There is no tenderness.  Musculoskeletal: She exhibits no edema or tenderness.  Lymphadenopathy:    She has no cervical adenopathy.  Neurological: She is alert. Coordination normal.  Focal deficit appreciated.   Mildly unstable gait. He has difficulty completing sentences. Oriented in place and person.  Skin: Skin is warm. No rash noted. No erythema.  Psychiatric: Her mood appears anxious.  Well groomed, good eye contact.     ASSESSMENT AND PLAN:   Ms. Kathryn [name] was seen today for 6 months follow-up.  Diagnoses and all orders for this visit:  Lab Results  Component Value Date   CREATININE 1.00  08/21/2017   BUN 10 08/21/2017   NA 139 08/21/2017   K 4.0 08/21/2017   CL 101 08/21/2017   CO2 28 08/21/2017   Lab Results  Component Value Date   CHOL 235 (H) 08/21/2017   HDL 75.50 08/21/2017   LDLCALC 129 (H) 08/21/2017   TRIG 152.0 (H) 08/21/2017   CHOLHDL 3 08/21/2017    CKD (chronic kidney disease), stage III (HCC)  BP adequately controlled with non pharmacologic treatment. Adequate hydration and avoidance of NSAID's. Low salt diet to continue. Further recommendations will be given according to lab results.  -     Basic metabolic panel -     VITAMIN D 25 Hydroxy (Vit-D Deficiency, Fractures) -  Microalbumin / creatinine urine ratio  Hypercholesterolemia  Continue non pharmacologic management, will follow labs done today and will give further recommendations accordingly.  -     Lipid panel  Alzheimer's dementia without behavioral disturbance, unspecified timing of dementia onset  Stable otherwise. Tolerating medications well. Continue following with Dr Kathryn [name].  Generalized anxiety disorder  It has improved. No need at this time for pharmacologic treatment.          -Ms. KAITLEN REDFORD was advised to return sooner than planned today if new concerns arise.       Wende Longstreth G. [name], MD  [facility]. [place] office.

## 2017-08-21 ENCOUNTER — Ambulatory Visit (INDEPENDENT_AMBULATORY_CARE_PROVIDER_SITE_OTHER): Payer: PPO | Admitting: Family Medicine

## 2017-08-21 ENCOUNTER — Encounter: Payer: Self-pay | Admitting: Family Medicine

## 2017-08-21 VITALS — BP 130/77 | HR 75 | Temp 97.6°F | Resp 12 | Ht 62.5 in | Wt 124.4 lb

## 2017-08-21 DIAGNOSIS — F411 Generalized anxiety disorder: Secondary | ICD-10-CM

## 2017-08-21 DIAGNOSIS — G309 Alzheimer's disease, unspecified: Secondary | ICD-10-CM

## 2017-08-21 DIAGNOSIS — N183 Chronic kidney disease, stage 3 unspecified: Secondary | ICD-10-CM

## 2017-08-21 DIAGNOSIS — F0280 Dementia in other diseases classified elsewhere without behavioral disturbance: Secondary | ICD-10-CM | POA: Diagnosis not present

## 2017-08-21 DIAGNOSIS — E7800 Pure hypercholesterolemia, unspecified: Secondary | ICD-10-CM

## 2017-08-21 LAB — LIPID PANEL
CHOLESTEROL: 235 mg/dL — AB (ref 0–200)
HDL: 75.5 mg/dL (ref >39.00)
LDL Cholesterol: 129 mg/dL — ABNORMAL HIGH (ref 0–99)
NonHDL: 159.18
Total CHOL/HDL Ratio: 3
Triglycerides: 152 mg/dL — ABNORMAL HIGH (ref 0.0–149.0)
VLDL: 30.4 mg/dL (ref 0.0–40.0)

## 2017-08-21 LAB — BASIC METABOLIC PANEL
BUN: 10 mg/dL (ref 6–23)
CALCIUM: 9.6 mg/dL (ref 8.4–10.5)
CO2: 28 mEq/L (ref 19–32)
CREATININE: 1 mg/dL (ref 0.40–1.20)
Chloride: 101 mEq/L (ref 96–112)
GFR: 56.5 mL/min — AB (ref >60.00)
Glucose, Bld: 108 mg/dL — ABNORMAL HIGH (ref 70–99)
Potassium: 4 mEq/L (ref 3.5–5.1)
Sodium: 139 mEq/L (ref 135–145)

## 2017-08-21 LAB — VITAMIN D 25 HYDROXY (VIT D DEFICIENCY, FRACTURES): VITD: 56.17 ng/mL (ref 30.00–100.00)

## 2017-08-21 NOTE — Patient Instructions (Signed)
A few things to remember from today's visit:   CKD (chronic kidney disease), stage III ([place]) - Plan: Basic metabolic panel, VITAMIN D 25 Hydroxy (Vit-D Deficiency, Fractures)  Alzheimer's dementia without behavioral disturbance, unspecified timing of dementia onset  Generalized anxiety disorder  Hypercholesterolemia - Plan: Lipid panel   We have ordered labs or studies at this visit.  It can take up to 1-2 weeks for results and processing. IF results require follow up or explanation, we will call you with instructions. Clinically stable results will be released to your [facility]. If you have not heard from [place] or cannot find your results in [facility] in 2 weeks please contact our office at [phone].  For now no changes.  Fall Prevention in the Home Falls can cause injuries. They can happen to people of all ages. There are many things you can do to make your home safe and to help prevent falls. What can I do on the outside of my home?  Regularly fix the edges of walkways and driveways and fix any cracks.  Remove anything that might make you trip as you walk through a door, such as a raised step or threshold.  Trim any bushes or trees on the path to your home.  Use bright outdoor lighting.  Clear any walking paths of anything that might make someone trip, such as rocks or tools.  Regularly check to see if handrails are loose or broken. Make sure that both sides of any steps have handrails.  Any raised decks and porches should have guardrails on the edges.  Have any leaves, snow, or ice cleared regularly.  Use sand or salt on walking paths during winter.  Clean up any spills in your garage right away. This includes oil or grease spills. What can I do in the bathroom?  Use night lights.  Install grab bars by the toilet and in the tub and shower. Do not use towel bars as grab bars.  Use non-skid mats or decals in the tub or shower.  If you need to sit down in the shower, use  a plastic, non-slip stool.  Keep the floor dry. Clean up any water that spills on the floor as soon as it happens.  Remove soap buildup in the tub or shower regularly.  Attach bath mats securely with double-sided non-slip rug tape.  Do not have throw rugs and other things on the floor that can make you trip. What can I do in the bedroom?  Use night lights.  Make sure that you have a light by your bed that is easy to reach.  Do not use any sheets or blankets that are too big for your bed. They should not hang down onto the floor.  Have a firm chair that has side arms. You can use this for support while you get dressed.  Do not have throw rugs and other things on the floor that can make you trip. What can I do in the kitchen?  Clean up any spills right away.  Avoid walking on wet floors.  Keep items that you use a lot in easy-to-reach places.  If you need to reach something above you, use a strong step stool that has a grab bar.  Keep electrical cords out of the way.  Do not use floor polish or wax that makes floors slippery. If you must use wax, use non-skid floor wax.  Do not have throw rugs and other things on the floor that can make you  trip. What can I do with my stairs?  Do not leave any items on the stairs.  Make sure that there are handrails on both sides of the stairs and use them. Fix handrails that are broken or loose. Make sure that handrails are as long as the stairways.  Check any carpeting to make sure that it is firmly attached to the stairs. Fix any carpet that is loose or worn.  Avoid having throw rugs at the top or bottom of the stairs. If you do have throw rugs, attach them to the floor with carpet tape.  Make sure that you have a light switch at the top of the stairs and the bottom of the stairs. If you do not have them, ask someone to add them for you. What else can I do to help prevent falls?  Wear shoes that: ? Do not have high heels. ? Have  rubber bottoms. ? Are comfortable and fit you well. ? Are closed at the toe. Do not wear sandals.  If you use a stepladder: ? Make sure that it is fully opened. Do not climb a closed stepladder. ? Make sure that both sides of the stepladder are locked into place. ? Ask someone to hold it for you, if possible.  Clearly mark and make sure that you can see: ? Any grab bars or handrails. ? First and last steps. ? Where the edge of each step is.  Use tools that help you move around (mobility aids) if they are needed. These include: ? Canes. ? Walkers. ? Scooters. ? Crutches.  Turn on the lights when you go into a dark area. Replace any light bulbs as soon as they burn out.  Set up your furniture so you have a clear path. Avoid moving your furniture around.  If any of your floors are uneven, fix them.  If there are any pets around you, be aware of where they are.  Review your medicines with your doctor. Some medicines can make you feel dizzy. This can increase your chance of falling. Ask your doctor what other things that you can do to help prevent falls. This information is not intended to replace advice given to you by your health care provider. Make sure you discuss any questions you have with your health care provider. Document Released: 05/17/2009 Document Revised: 12/27/2015 Document Reviewed: 08/25/2014 Elsevier Interactive Patient Education  [employer].   Please be sure medication list is accurate. If a new problem present, please set up appointment sooner than planned today.

## 2017-08-22 ENCOUNTER — Encounter: Payer: Self-pay | Admitting: Family Medicine

## 2017-08-24 LAB — MICROALBUMIN / CREATININE URINE RATIO: MICROALB/CREAT RATIO: 70 mg/g — AB (ref 0.0–30.0)

## 2017-08-25 ENCOUNTER — Encounter: Payer: Self-pay | Admitting: Family Medicine

## 2017-09-05 ENCOUNTER — Other Ambulatory Visit: Payer: Self-pay | Admitting: Neurology

## 2017-09-06 ENCOUNTER — Telehealth: Payer: Self-pay | Admitting: *Deleted

## 2017-09-06 NOTE — Telephone Encounter (Signed)
Called & spoke w/ pt's husband Kathryn [name], ec. Informed him that Dr. [name] will be out of the office on 2/4 so Kathryn [name]'s appt will need to be r/s. He verbalized understanding. Offered appt on 2/26 but preferred earlier. R/s patient for Monday 2/11 @ 4:00 arrival time 3:30. He verbalized appreciation.

## 2017-09-07 ENCOUNTER — Ambulatory Visit: Payer: Self-pay | Admitting: Neurology

## 2017-09-14 ENCOUNTER — Ambulatory Visit (INDEPENDENT_AMBULATORY_CARE_PROVIDER_SITE_OTHER): Payer: PPO | Admitting: Neurology

## 2017-09-14 ENCOUNTER — Encounter: Payer: Self-pay | Admitting: Neurology

## 2017-09-14 VITALS — BP 163/75 | HR 70 | Ht 62.0 in | Wt 126.0 lb

## 2017-09-14 DIAGNOSIS — F0390 Unspecified dementia without behavioral disturbance: Secondary | ICD-10-CM | POA: Diagnosis not present

## 2017-09-14 DIAGNOSIS — R4701 Aphasia: Secondary | ICD-10-CM

## 2017-09-14 MED ORDER — GABAPENTIN 100 MG PO CAPS: 100.0000 mg | ORAL_CAPSULE | Freq: Three times a day (TID) | ORAL | 6 refills | Status: DC | PRN

## 2017-09-14 NOTE — Progress Notes (Signed)
[facility]    Provider:  Dr [name] Referring Provider: [name], [name], MD Primary Care Physician:  [name], [name], MD  CC: Memory changes, aphasia and headaches  interval history 09/14/2017: She has difficulty expressing herself. She became frustrated on the MMSE. She fixes her own breakfast and lunch. Here with husband who provides much information. She goes to chair yoga. She goes every week. Her main concern is ability to speak, she knows what she wants to say but cannot say it. Difficulty expressing herself and she gets frustrated. Her MMSE today is 11/30 however unclear how much her speech difficulty and frustration with speech affects her score. Diagnosis in the past Alzheimer's but wonder if she has a form of primary progressive aphasia as oppsoed to Alzheimers.  Interval history 04/02/2017: Patient is here with husband for follow-up. Husband is asking my opinion on whether patient has capacity to make decisions regarding estate planning. She has difficulty due to dementia expressing but she has a basic understanding when trying to explain POA and beneficiaries - But I do think she understands when asked and explained she grasps the concepts and is able to agree or decline. If you ask her, she is able to express her wishes for inheritance, POA and other legal issues. She is very clear on her thoughts on inheritance and family members she would like to be included and how her estate should be dispersed. She also is also in agreement with her husband and has no reservations about his decision making and his concern for her well-being.  Interval history 09/04/2016: Patient continues to have headaches. CT of the head was negative for acute intracranial abnormality. She has daily headaches. She was taking up to 7 tylenol daily and I advised him to stop as this can cause significant side effects including rebound headaches. No falls or head injury. She has a difficult time  describing the headaches due to dementia. We stopped Aricept to see if this would help but it has not made any changes. They were using the tylenol and they stopped the last 2 days. The last 2 days she has not taken tylenol and she has not mentioned the headache. No double vision or neck pain. CRP and sed rate were normal. I have advised no OTC medications more than 2-3x a week to avoid rebound headaches. No previous history of migraines. Headache is in the temple areas. No vision changes or other signs of temporal arteritis. They are going to see the eye doctor. She also stopped all supplements and they are slowly adding them back. I advised we don;t exactly know what is in all these supplements and I advise them to stop if there are headaches. She has been better these last 2 days and I advised if it continues/worsens we can order an MRI brain. Headaches are with activity. A home nurse took blood pressure about 140. She has fallen twice she sits in a chair and she got up and turned too fast and fell on the floor. We could also try low dose neurontin. I recommended PT in the home for gait and safety and a home safety study and they declined, I advised safety precautions for fall risks. Husband thinks stress may be causing the headache due to her brother's health problems and recent illness, her son has also been in the hospital twice in the last 3 months for alcoholism. Husband takes notes today.   Interval history: 03/03/2016: Here for follow up of alzheimer's dementia.  Repeat moCA 12/30 (last 14/30). She is on Aricept, did not tolerate the Namenda. B12 normal. Last TSH WNL. Here with husband. She does chair yoga. Her blood pressures at home have been normal avg 133/70, elevation in the officelikely from anxiety. She is sleeping well. There is some stress with patient's daughter which is better. They have sons with substance abuse and this has improved as well. She still sees her high school friends from  [place] her HS at [employer] 4x a year, no behavioral prolems, she is lovely.   Interval history 09/04/2015: She is feeling better. She is having more bowel movements with the namenda. No diarrhea. It is not causing her discomfort, she is not dehydrated, she doesn't drink a lot of coffee, she has a great diet full of fiber. Anxiety level is better. She has projects to do at home, she is remaining active daily. She is here with her husband.   Reviewed MRi of the brain: IMPRESSION: This is an abnormal MRI of the brain without contrast showed the following: 1. Moderate generalized cortical atrophy that is most pronounced in the mesial temporal lobes. 2. Several T2/FLAIR hyperintense foci in the deep and subcortical white matter consistent with chronic microvascular ischemic changes. The extent is typical for age.  B12 and folate normal.   HPI: Kathryn [name] is a 84 y.o. female here as a referral from Dr. Justin Mend for memory loss. PMHx anxiety, HTN, fatige, headache. She lives with her husband in a single-family home. Husband is here with her. She functions well at home per patient, she cooks and likes to do yard work. She watches the news dails and enjoys watching movies. Patient says she forgets things at the store. Husband noticed the memory changes which started last October/November when they were dealing with bed bugs. Husband says directions were difficult for patient, he had to watch patient closely to make sure she followed directions. Previous to this the changes were subtle, she would forget little things. There has been a decline in memory since then. Slowly progressive. She cooks, cleans, she drives. One time the stove was left on but otherwise no accidents in the home or in the car. She doesn't drive far, she doesn't get lost. She knows where to go around her house. She can make dinner fine. One time she forgot to add an ingredient in dinner but that was just an oversight. She is sleeping  well. She writes down appointments and keep appointment cards. She has forgotten one appointment a long time ago. Husband does the finances, patient used to do the finances but husband took over because patient couldn't keep up with it. She was forgetting to pay bills. No behavioral changes except sometimes she gets aggravated at herself. No hallucinations or delusions. Mother with Alzheimers at about age 74. Husband is becoming concerned with wife's driving. She goes to yoga every week. No other focal neurologic symptoms. Older memories are intact. More recent memories impaired.   Reviewed notes, labs and imaging from outside physicians, which showed: patient was referred for evaluation of alzheimer's dementia. CMP unremarkable. TSH wnl.   Review of Systems: Patient complains of symptoms per HPI as well as the following symptoms: No fever, no chills, no CP, no sob. Pertinent negatives per HPI. All others negative.   Review of Systems: Patient complains of symptoms per HPI as well as the following symptoms: no agitation. Pertinent negatives and positives per HPI. All others negative.   Social History   Socioeconomic History  .  Marital status: Married    Spouse name: [name]   . Number of children: 3  . Years of education: 20  . Highest education level: Not on file  Social Needs  . Financial resource strain: Not on file  . Food insecurity - worry: Not on file  . Food insecurity - inability: Not on file  . Transportation needs - medical: Not on file  . Transportation needs - non-medical: Not on file  Occupational History  . Not on file  Tobacco Use  . Smoking status: Former Smoker    Last attempt to quit: 1980    Years since quitting: 39.1  . Smokeless tobacco: Never Used  Substance and Sexual Activity  . Alcohol use: Yes    Alcohol/week: 8.4 oz    Types: 14 Glasses of wine per week    Comment: white wine  . Drug use: No  . Sexual activity: Not on file  Other Topics Concern    . Not on file  Social History Narrative   Lives at home with husband   Caffeine use: Drinks coffee/tea    Right handed    Family History  Problem Relation Age of Onset  . Thyroid disease Mother   . Alzheimer's disease Mother   . Dementia Mother   . Hearing loss Brother   . Hearing loss Brother        also developing memory problems  . Heart Problems Brother     Past Medical History:  Diagnosis Date  . Alzheimer's dementia   . Anxiety   . Headache   . Hypertension     Past Surgical History:  Procedure Laterality Date  . BREAST BIOPSY      Current Outpatient Medications  Medication Sig Dispense Refill  . Cholecalciferol (VITAMIN D3) 2000 UNITS TABS Take by mouth daily.     . Coenzyme Q10 (COQ10 PO) Take by mouth.    . folic acid (FOLVITE) 967 MCG tablet Take 400 mcg by mouth daily.    Marland Kitchen gabapentin (NEURONTIN) 100 MG capsule TAKE 1 CAPSULE (100 MG TOTAL) BY MOUTH 3 (THREE) TIMES DAILY AS NEEDED. 90 capsule 0  . Grape Seed Extract 100 MG CAPS Take by mouth.    . NON FORMULARY MK-7 90 meq    . Omega-3 Fatty Acids (FISH OIL) 1000 MG CAPS Take by mouth.    . pyridoxine (B-6) 100 MG tablet Take 100 mg by mouth daily.    . rivastigmine (EXELON) 6 MG capsule Take 1 capsule (6 mg total) 2 (two) times daily by mouth. 60 capsule 11  . zinc gluconate 50 MG tablet Take 50 mg by mouth daily.    . Potassium Gluconate 550 MG TABS Take by mouth.     No current facility-administered medications for this visit.     Allergies as of 09/14/2017 - Review Complete 09/14/2017  Allergen Reaction Noted  . Aricept [donepezil hcl] Other (See Comments) 09/04/2016  . Sulfonamide derivatives  09/23/2010    Vitals: BP (!) 163/75 (BP Location: Left Arm, Patient Position: Sitting) Comment: md aware  Pulse 70   Ht 5\' 2"  (1.575 m)   Wt 126 lb (57.2 kg)   BMI 23.05 kg/m  Last Weight:  Wt Readings from Last 1 Encounters:  09/14/17 126 lb (57.2 kg)   Last Height:   Ht Readings from Last 1  Encounters:  09/14/17 5\' 2"  (1.575 m)         Cranial Nerves:  The pupils are equal, round, and reactive  to light. Visual fields are full to finger confrontation. Extraocular movements are intact. Trigeminal sensation is intact and the muscles of mastication are normal. The face is symmetric. The palate elevates in the midline. Hearing intact. Voice is normal. Shoulder shrug is normal. The tongue has normal motion without fasciculations.   Motor Observation:  No asymmetry, no atrophy, and no involuntary movements noted. Tone:  Normal muscle tone.   Posture:  Posture is normal. normal erect   Strength:  Strength is V/V in the upper and lower limbs.    Sensation: intact to LT   Reflex Exam:  DTR's:  Deep tendon reflexes in the upper and lower extremities are normal bilaterally.  Toes: left upgoing Clonus:  Clonus is absent.   MMSE - Mini Mental State Exam 09/14/2017 12/01/2016  Orientation to time 1 1  Orientation to Place 2 3  Registration 3 3  Attention/ Calculation 0 0  Recall 0 0  Language- name 2 objects 1 2  Language- repeat 0 1  Language- follow 3 step command 3 3  Language- read & follow direction 1 1  Write a sentence 0 0  Copy design 0 0  Total score 11 14     Assessment/Plan: This is a very lovely 84year old female with memory loss.  - Initially thought to be Alzheimer's type dementia but given her significant aphasia may also consider a primary progressive aphasia instead. Aphasia is worsening. - MRi of the brain showed moderate generalized cortical atrophy that is most pronounced in the mesial temporal lobes and several T2/FLAIR hyperintense foci in the deep and subcortical white matter consistent with chronic microvascular ischemic changes. The extent is typical for age.  - B12, folate and TSH WNL.  - Headaches stable,  in the setting of stress, CT of the head negative, continue gabapentin prn - Did not tolerate  Aricept, we continue her Exelon. - Did not tolerate the Namenda 10mg  twice daily.  - No Driving - She has significant aphasia, refer to speech therapy  - Patient is here with husband for follow-up. Husband is asking my opinion on whether patient has capacity to make decisions regarding estate planning. She has difficulty due to dementia but she has a basic understanding when trying to explain POA and beneficiaries, I do think she  she grasps the concepts and is able to agree or decline with state planning decisions. She is able to express her wishes for inheritance, POA and other legal issues. She is very clear on her thoughts on inheritance and family members she would like to be included and how her estate should be dispersed. She also is also in agreement with her husband and has no reservations about his decision making and his concern for her well-being.   Discussed dementia, progressive disorder, will return in 6 months.   Orders Placed This Encounter  Procedures  . Ambulatory referral to [place], MD  [facility] [address] [place], [place] [address]  Phone [phone] Fax [phone]  A total of 30 minutes was spent face-to-face with this patient. Over half this time was spent on counseling patient on the aphasia diagnosis and different diagnostic and therapeutic options available.

## 2017-09-14 NOTE — Patient Instructions (Signed)
Continue current medications. 

## 2017-10-07 ENCOUNTER — Other Ambulatory Visit: Payer: Self-pay | Admitting: Neurology

## 2017-10-16 ENCOUNTER — Ambulatory Visit: Payer: PPO | Attending: Neurology

## 2017-10-16 DIAGNOSIS — R482 Apraxia: Secondary | ICD-10-CM | POA: Diagnosis not present

## 2017-10-16 DIAGNOSIS — R41841 Cognitive communication deficit: Secondary | ICD-10-CM | POA: Diagnosis not present

## 2017-10-16 DIAGNOSIS — R4701 Aphasia: Secondary | ICD-10-CM | POA: Diagnosis not present

## 2017-10-18 NOTE — Therapy (Signed)
[facility] [address] [place], [place], [address] Phone: [phone]   Fax:  [phone]  Speech Language Pathology Evaluation  Patient Details  Name: Kathryn [name] MRN: [id number] Date of Birth: October 29, 1933 Referring Provider: Sarina Ill, MD   Encounter Date: 10/16/2017  End of Session - 10/18/17 2321    Visit Number  1    Number of Visits  9    Date for SLP Re-Evaluation  01/01/18    SLP Start Time  0850    SLP Stop Time   0931    SLP Time Calculation (min)  41 min    Activity Tolerance  Patient tolerated treatment well       Past Medical History:  Diagnosis Date  . Alzheimer's dementia   . Anxiety   . Headache   . Hypertension     Past Surgical History:  Procedure Laterality Date  . BREAST BIOPSY      There were no vitals filed for this visit.  Subjective Assessment - 10/18/17 2315    Subjective  Pt arrives omitting words, syllables, phonemes with hesitating speech, in simple "pleasantry" speech on the way to ST room.    Patient is accompained by:  Family member husband    Currently in Pain?  No/denies         SLP Evaluation [facility] - 10/18/17 2315      SLP Visit Information   SLP Received On  10/16/17    Referring Provider  Sarina Ill, MD    Onset Date  2017    Medical Diagnosis  Word finding difficulty      Subjective   Subjective  (Hesitation/pausing) "When I know somebody it goes good."      General Information   HPI  Memory changes noted in 2016 (14/30 [facility]); July 2017 12/30; December 2017 more word finding difficulties; July 2018 does not use stove for cooking anymore, and noted diffiuclty with talking about estate planning, February 2019 becomes frustrated because she knows her message but cannot - [name] thinks PPA instead of Alzheimer's.       Prior Functional Status   Cognitive/Linguistic Baseline  Within functional limits      Cognition   Overall Cognitive Status   Impaired/Different from baseline    Area of Impairment  Memory noted from Epic      Verbal Expression   Overall Verbal Expression  Impaired    Automatic Speech  Social Response apraxic and aphasic errors with auto naming    Level of Generative/Spontaneous Verbalization  Phrase;Sentence    Other Verbal Expression Comments  Pt admits to having incr'd anxiety when disordered speech occurs.      Written Expression   Dominant Hand  Right    Written Expression  Exceptions to [facility]    Self Formulation Ability  -- demo'd aphasia in writing her name      Motor Speech   Overall Motor Speech  -- Not tested today due to time; expected disordered      Rote speech (#s, DOW, MOY) were very aphasic and apraxic in nature, as transposition of phonemes, anticipatory phoneme errors, and semantic paraphasias were all present in these simple linguistic trials. As stated earlier, even simple "pleasantry" speech was non-functional, and pt's anxiety played a negative role in pt's severity of communicative ability. Because of this, an anti-anxiety medication may want to be considered.                SLP Education -  10/18/17 2320    Education provided  Yes    Education Details  PPA handout, results from ST eval, work on compensations (degenerative deisease process expected)    Person(s) Educated  Patient;Spouse    Methods  Explanation;Demonstration    Comprehension  Verbalized understanding;Need further instruction       SLP Short Term Goals - 10/18/17 2326      SLP SHORT TERM GOAL #1   Title  pt and husband will agree that pt's ability to communicate has improved by 10% by using compensation measures    Time  4    Period  Weeks or visits, for all STGs    Status  New      SLP SHORT TERM GOAL #2   Title  pt will spontaneously use any compensation technique resulting in a successful communication attempt x3 in one session    Time  4    Period  Weeks    Status  New       SLP Long Term  Goals - 10/18/17 2328      SLP LONG TERM GOAL #1   Title  pt and/or  husband will state that pt's ability to communicate has improved by 25% since prior to initiation of ST, using compensations    Time  8    Period  Weeks or visits, for all LTGs    Status  New      SLP LONG TERM GOAL #2   Title  pt will use any compensatory measure to result in successful communication with husband and SLP x5 in a session over two visits    Time  8    Period  Weeks    Status  New       Plan - 10/18/17 2322    Clinical Impression Statement  Pt presents today with memory deficits, and 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 Pt anxiety over her inability to communicate complicates/worsens severity. Pt and husband would benefit from skilled ST addressing strategies and techniques to assist in communication. Pt and husband agree with this plan.     Speech Therapy Frequency  1x /week    Duration  -- 8 weeks, or sessions    Treatment/Interventions  Language facilitation;Environmental controls;Cueing hierarchy;SLP instruction and feedback;Compensatory strategies;Patient/family education;Multimodal communcation approach;Internal/external aids    Potential to Achieve Goals  Fair    Potential Considerations  Severity of impairments;Medical prognosis    Consulted and Agree with Plan of Care  Patient;Family member/caregiver    Family Member Consulted  husband       Patient will benefit from skilled therapeutic intervention in order to improve the following deficits and impairments:   Aphasia  Verbal apraxia    Problem List Patient Active Problem List   Diagnosis Date Noted  . Aphasia 09/14/2017  . Headache 07/10/2016  . CKD (chronic kidney disease), stage III ([place]) 06/20/2016  . Dementia 09/04/2015  . Generalized anxiety disorder 05/29/2015  . HTN (hypertension) 05/29/2015  . Memory loss  05/28/2015    [facility] ,[place], [place]  10/18/2017, 11:31 PM  [facility] [address] [place], [place], [address] Phone: [phone]   Fax:  [phone]  Name: Kathryn [name] MRN: [id number] Date of Birth: 12-Oct-1933

## 2017-10-20 ENCOUNTER — Ambulatory Visit: Payer: PPO

## 2017-10-20 DIAGNOSIS — R482 Apraxia: Secondary | ICD-10-CM

## 2017-10-20 DIAGNOSIS — R4701 Aphasia: Secondary | ICD-10-CM

## 2017-10-20 DIAGNOSIS — R41841 Cognitive communication deficit: Secondary | ICD-10-CM

## 2017-10-20 NOTE — Therapy (Signed)
[facility] [address] [place], [place], [address] Phone: [phone]   Fax:  [phone]  Speech Language Pathology Treatment  Patient Details  Name: Kathryn [name] MRN: [id number] Date of Birth: 1933/08/31 Referring Provider: Sarina Ill, MD   Encounter Date: 10/20/2017  End of Session - 10/20/17 1413    Visit Number  2    Number of Visits  9    Date for SLP Re-Evaluation  01/01/18    SLP Start Time  0803    SLP Stop Time   0847    SLP Time Calculation (min)  44 min    Activity Tolerance  Patient tolerated treatment well       Past Medical History:  Diagnosis Date  . Alzheimer's dementia   . Anxiety   . Headache   . Hypertension     Past Surgical History:  Procedure Laterality Date  . BREAST BIOPSY      There were no vitals filed for this visit.  Subjective Assessment - 10/20/17 0805    Subjective  "In the fast (past) I could do -  everything and - - now - I tan't (can't)."    Patient is accompained by:  Family member husband    Currently in Pain?  No/denies            ADULT SLP TREATMENT - 10/20/17 0805      General Information   Behavior/Cognition  Alert;Cooperative;Pleasant mood      Treatment Provided   Treatment provided  Cognitive-Linquistic      Cognitive-Linquistic Treatment   Treatment focused on  Apraxia;Aphasia    Skilled Treatment  SLP provided education about other verbal ways to communicate (compensatory strategies - rephrase/circumlocution, description, and synonym) and nonverbal ways to communicate (gesture, draw, write, and photos and captions or sentences). 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 SLP then worked with pt with drawing simple objects - pt also had significant difficulty with drawing a picture- pt perseverated on trying to copy the word instead of drawing, despite max  cues from SLP to draw picture. SLP told pt and husband to generate sentences/phrases for pt to point to when she wants to communicate. SLP made dots for pt to trace her name due to expressing frustration at being unable to print her name, and she was unable to do this - perseveration on making vertical lines.       Assessment / Recommendations / Plan   Plan  Continue with current plan of care      Progression Toward Goals   Progression toward goals  Progressing toward goals         SLP Short Term Goals - 10/20/17 1416      SLP SHORT TERM GOAL #1   Title  pt and husband will agree that pt's ability to communicate has improved by 10% by using compensation measures    Time  4    Period  Weeks or visits, for all STGs    Status  On-going      SLP SHORT TERM GOAL #2   Title  pt will spontaneously use any compensation technique resulting in a successful communication attempt x3 in one session    Time  4    Period  Weeks    Status  On-going       SLP Long Term Goals - 10/20/17 1416  SLP LONG TERM GOAL #1   Title  pt and/or  husband will state that pt's ability to communicate has improved by 25% since prior to initiation of ST, using compensations    Time  8    Period  Weeks or visits, for all LTGs    Status  On-going      SLP LONG TERM GOAL #2   Title  pt will use any compensatory measure to result in successful communication with husband and SLP x5 in a session over two visits    Time  8    Period  Weeks    Status  On-going       Plan - 10/20/17 1413    Clinical Impression Statement  Pt's 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 Today, she generated more spontaneous speech when notably relaxed, however when her performance suffered she became obviously anxious over her inability to communicate and speech skills deteriorated rapidly. SLP  strongly encouraged husband (and pt) to generate pages with topics/situations and some sentecnes pt could point to, as her reading for single words/phrases is good. Pt and husband would cont to benefit from skilled ST addressing strategies and techniques to assist in communication. Pt and husband agree with this plan.     Speech Therapy Frequency  1x /week    Duration  -- 8 weeks, or sessions    Treatment/Interventions  Language facilitation;Environmental controls;Cueing hierarchy;SLP instruction and feedback;Compensatory strategies;Patient/family education;Multimodal communcation approach;Internal/external aids    Potential to Achieve Goals  Fair    Potential Considerations  Severity of impairments;Medical prognosis    Consulted and Agree with Plan of Care  Patient;Family member/caregiver    Family Member Consulted  husband       Patient will benefit from skilled therapeutic intervention in order to improve the following deficits and impairments:   Aphasia  Verbal apraxia  Cognitive communication deficit    Problem List Patient Active Problem List   Diagnosis Date Noted  . Aphasia 09/14/2017  . Headache 07/10/2016  . CKD (chronic kidney disease), stage III ([place]) 06/20/2016  . Dementia 09/04/2015  . Generalized anxiety disorder 05/29/2015  . HTN (hypertension) 05/29/2015  . Memory loss 05/28/2015    [facility] ,[place], [place]  10/20/2017, 2:17 PM  [facility] [address] [place], [place], [address] Phone: [phone]   Fax:  [phone]   Name: Kathryn [name] MRN: [id number] Date of Birth: Apr 06, 1934

## 2017-10-20 NOTE — Patient Instructions (Signed)
Make a list of sentences that would be helpful for communicating with each other. Think about pictures you could take of common places you both go each week.

## 2017-10-27 ENCOUNTER — Ambulatory Visit: Payer: PPO | Admitting: Speech Pathology

## 2017-10-27 DIAGNOSIS — R4701 Aphasia: Secondary | ICD-10-CM

## 2017-10-27 DIAGNOSIS — R482 Apraxia: Secondary | ICD-10-CM

## 2017-10-27 DIAGNOSIS — R41841 Cognitive communication deficit: Secondary | ICD-10-CM

## 2017-10-27 NOTE — Therapy (Signed)
[facility] [address] [place], [place], [address] Phone: [phone]   Fax:  [phone]  Speech Language Pathology Treatment  Patient Details  Name: Kathryn [name] MRN: [id number] Date of Birth: April 24, 1934 Referring Provider: Sarina Ill, MD   Encounter Date: 10/27/2017  End of Session - 10/27/17 1223    Visit Number  3    Number of Visits  9    Date for SLP Re-Evaluation  01/01/18    SLP Start Time  1103    SLP Stop Time   1155    SLP Time Calculation (min)  52 min    Activity Tolerance  Patient tolerated treatment well       Past Medical History:  Diagnosis Date  . Alzheimer's dementia   . Anxiety   . Headache   . Hypertension     Past Surgical History:  Procedure Laterality Date  . BREAST BIOPSY      There were no vitals filed for this visit.  Subjective Assessment - 10/27/17 1122    Subjective  "I  tried with him ,but I couldn't do it"    Patient is accompained by:  Family member    Currently in Pain?  No/denies            ADULT SLP TREATMENT - 10/27/17 1122      General Information   Behavior/Cognition  Alert;Cooperative;Pleasant mood      Treatment Provided   Treatment provided  Cognitive-Linquistic      Cognitive-Linquistic Treatment   Treatment focused on  Apraxia;Aphasia    Skilled Treatment  Attempted drawing basic shapes for ongoing assesment of drawing to augment verbal expression. Pt perseverated on circle and required copy cues for triangle, square. She was not able to accurately copy diamond or star. She demonstrated ideational type UE apraxia when asked to wave, point, make the peace sing and required mod A to ID and correct errors. Trained pt and spouse in [place] as compensations for aphasia. Pt required extended time and usual mod to max questioning cues with choices to verbalize 1-2 simple desciptions of basic objects (animals and food). Trained spouse in use of  specific questions or choices when communicating with pt. Spouse is working on Aeronautical engineer of common phrases/items to augment pt's verbal expression      Assessment / Recommendations / [place] with current plan of care      Progression Toward Goals   Progression toward goals  Progressing toward goals       SLP Education - 10/27/17 1215    Education provided  Yes    Education Details  Communication partner's compensations for aphasia, environmental compensations    Person(s) Educated  Patient;Spouse    Methods  Explanation;Demonstration;Verbal cues;Handout    Comprehension  Verbalized understanding;Verbal cues required       SLP Short Term Goals - 10/27/17 1222      SLP SHORT TERM GOAL #1   Title  pt and husband will agree that pt's ability to communicate has improved by 10% by using compensation measures    Time  3    Period  Weeks or visits, for all STGs    Status  On-going      SLP SHORT TERM GOAL #2   Title  pt will spontaneously use any compensation technique resulting in a successful communication attempt x3 in one session    Time  3    Period  Weeks  Status  On-going       SLP Long Term Goals - 10/27/17 1222      SLP LONG TERM GOAL #1   Title  pt and/or  husband will state that pt's ability to communicate has improved by 25% since prior to initiation of ST, using compensations    Time  7    Period  Weeks or visits, for all LTGs    Status  On-going      SLP LONG TERM GOAL #2   Title  pt will use any compensatory measure to result in successful communication with husband and SLP x5 in a session over two visits    Time  7    Period  Weeks    Status  On-going       Plan - 10/27/17 1217    Clinical Impression Statement  Pt quite anxious today, consistently wringing her hands and licking her lips. Spontaneous speech perseverated on her overly harsh father and chair yoga. Spouse is still working on topic/phrases to assist with verbal expression.  He did not have those for the session today. Attempted compensation of generating 1-2 words that describe a simple object with max questioning cues. trained spouse in asking specific questions or choice of 2. Continue skilled ST to maximize multimodal communication to reduce caretiver burden.     Speech Therapy Frequency  1x /week    Treatment/Interventions  Language facilitation;Environmental controls;Cueing hierarchy;SLP instruction and feedback;Compensatory strategies;Patient/family education;Multimodal communcation approach;Internal/external aids    Potential to Achieve Goals  Fair    Potential Considerations  Severity of impairments;Medical prognosis    Consulted and Agree with Plan of Care  Patient;Family member/caregiver    Family Member Consulted  husband       Patient will benefit from skilled therapeutic intervention in order to improve the following deficits and impairments:   Aphasia  Verbal apraxia  Cognitive communication deficit    Problem List Patient Active Problem List   Diagnosis Date Noted  . Aphasia 09/14/2017  . Headache 07/10/2016  . CKD (chronic kidney disease), stage III ([place]) 06/20/2016  . Dementia 09/04/2015  . Generalized anxiety disorder 05/29/2015  . HTN (hypertension) 05/29/2015  . Memory loss 05/28/2015    [name], Kathryn Rusk MS, CCC-SLP 10/27/2017, 12:23 PM  [facility] [address] [place], [place], [address] Phone: [phone]   Fax:  [phone]   Name: Kathryn [name] MRN: [id number] Date of Birth: 1934/07/17

## 2017-10-27 NOTE — Patient Instructions (Signed)
  Play guessing game  Ask specific questions or give choice of 2  Tips for Talking with People who have Aphasia  . Say one thing at a time . Don't  rush - slow down, be patient . Talk face to face . Reduce background noise . Relax - be natural . Use pen and paper . Write down key words . Draw diagrams or pictures . Don't pretend you understand . Ask what helps . Recap - check you both understand . Be a partner, not a therapist   Aphasia does not affect intelligence, only language. The person with aphasia can still: make decisions, have opinions, and socialize.   Describing words  What group does it belong to?  What do I use it for?  Where can I find it?  What does it LOOK like?  What other words go with it?  What is the 1st sound of the word?   Many Ways to Communicate  Describe it Write it Draw it Gesture it  Preplan conversation - write down any topics Tanyia wants to talk about before the party, church, phone call, lunch etc  Preplan ordering and write it down for her   Provided by: Barnabas Lister [place], [phone]

## 2017-11-06 ENCOUNTER — Encounter: Payer: Self-pay | Admitting: Speech Pathology

## 2017-11-06 ENCOUNTER — Ambulatory Visit: Payer: PPO | Attending: Neurology | Admitting: Speech Pathology

## 2017-11-06 DIAGNOSIS — R4701 Aphasia: Secondary | ICD-10-CM

## 2017-11-06 DIAGNOSIS — R41841 Cognitive communication deficit: Secondary | ICD-10-CM | POA: Insufficient documentation

## 2017-11-06 DIAGNOSIS — R482 Apraxia: Secondary | ICD-10-CM

## 2017-11-06 NOTE — Patient Instructions (Signed)
  Practice gestures for basic objects, food, toiletries  Keep of list of ones you have practiced, which have been successful or not  Continue to encourage [place] to use any related word she can get out to help her communicate  Bring in pictures of family, friends, pets, places   [employer], store logos etc in a book for pointing

## 2017-11-06 NOTE — Therapy (Signed)
[facility] [address] [place], [place], [address] Phone: [phone]   Fax:  [phone]  Speech Language Pathology Treatment  Patient Details  Name: Kathryn [name] MRN: [id number] Date of Birth: [birth year] Referring Provider: Sarina Ill, MD   Encounter Date: 11/06/2017  End of Session - 11/06/17 1135    Visit Number  4    Number of Visits  9    Date for SLP Re-Evaluation  01/01/18    SLP Start Time  0933    SLP Stop Time   6433    SLP Time Calculation (min)  45 min    Activity Tolerance  Patient tolerated treatment well       Past Medical History:  Diagnosis Date  . Alzheimer's dementia   . Anxiety   . Headache   . Hypertension     Past Surgical History:  Procedure Laterality Date  . BREAST BIOPSY      There were no vitals filed for this visit.  Subjective Assessment - 11/06/17 1123    Subjective  "She almost didn't come today, she gets so anxious when she has to answer questions"    Patient is accompained by:  Family member    Currently in Pain?  No/denies            ADULT SLP TREATMENT - 11/06/17 1124      General Information   Behavior/Cognition  Alert;Cooperative;Pleasant mood      Treatment Provided   Treatment provided  Cognitive-Linquistic      Cognitive-Linquistic Treatment   Treatment focused on  Apraxia;Aphasia    Skilled Treatment  Focused today on use of gestures to augment limited verbal expression. Utilized LARK and trained pt in gestures while she was looking at the object. Pt imitated gestures with 70% accuracy with frequent detailed instructions and modeling. Modeled for spouse cueing strategies to help elicit a gesture. Next, I assisted pt in gesturing words without the object present, foucsing on food and personal items, then generalized to situations such as, "if your husband was going shopping and you needed him to buy tissues, how could you show him that? again, for  various foods and personal items. Pt did circumlocute today to succesfully communicate how she takes her two cups of coffee.       Assessment / Recommendations / Plan   Plan  Continue with current plan of care      Progression Toward Goals   Progression toward goals  Progressing toward goals       SLP Education - 11/06/17 1129    Education provided  Yes    Education Details  Use family photos, store logos, menus in communication book    Person(s) Educated  Patient;Spouse    Methods  Explanation;Demonstration;Verbal cues;Handout    Comprehension  Verbalized understanding;Verbal cues required       SLP Short Term Goals - 10/27/17 1222      SLP SHORT TERM GOAL #1   Title  pt and husband will agree that pt's ability to communicate has improved by 10% by using compensation measures    Time  3    Period  Weeks or visits, for all STGs    Status  On-going      SLP SHORT TERM GOAL #2   Title  pt will spontaneously use any compensation technique resulting in a successful communication attempt x3 in one session    Time  3    Period  Weeks  Status  On-going       SLP Long Term Goals - 10/27/17 1222      SLP LONG TERM GOAL #1   Title  pt and/or  husband will state that pt's ability to communicate has improved by 25% since prior to initiation of ST, using compensations    Time  7    Period  Weeks or visits, for all LTGs    Status  On-going      SLP LONG TERM GOAL #2   Title  pt will use any compensatory measure to result in successful communication with husband and SLP x5 in a session over two visits    Time  7    Period  Weeks    Status  On-going       Plan - 11/06/17 1131    Clinical Impression Statement  Pt continues to be anxious. We focused on use of gestures for common objects, foods and personal items. Pt was able to approximate gestures upon imitation with detailed instructions. Bilateral UE ideational apraxia does affect accuracy of gestures occasionally. Anxiety  decreased as she did not feel pressure to speak and answer questions today. Due to her memory/cognition impairment, spouse educated that gestures will have to be trained and used regularly at home to maximize carryover. Continue skilled ST to maximize multimodal communcation to reduce caregiver burden and assist pt in meeting wants/needs.    Speech Therapy Frequency  1x /week    Treatment/Interventions  Language facilitation;Environmental controls;Cueing hierarchy;SLP instruction and feedback;Compensatory strategies;Patient/family education;Multimodal communcation approach;Internal/external aids    Potential to Achieve Goals  Fair    Potential Considerations  Severity of impairments;Medical prognosis    Consulted and Agree with Plan of Care  Patient;Family member/caregiver       Patient will benefit from skilled therapeutic intervention in order to improve the following deficits and impairments:   Aphasia  Verbal apraxia  Cognitive communication deficit    Problem List Patient Active Problem List   Diagnosis Date Noted  . Aphasia 09/14/2017  . Headache 07/10/2016  . CKD (chronic kidney disease), stage III ([place]) 06/20/2016  . Dementia 09/04/2015  . Generalized anxiety disorder 05/29/2015  . HTN (hypertension) 05/29/2015  . Memory loss 05/28/2015    Karlie Aung, Annye Rusk MS, CCC-SLP 11/06/2017, 11:36 AM  [facility] [address] [place], [place], [address] Phone: [phone]   Fax:  [phone]   Name: Kathryn [name] MRN: [id number] Date of Birth: Jun 20, 1934

## 2017-11-13 ENCOUNTER — Encounter: Payer: Self-pay | Admitting: Neurology

## 2017-11-13 ENCOUNTER — Ambulatory Visit: Payer: PPO

## 2017-11-13 DIAGNOSIS — R4701 Aphasia: Secondary | ICD-10-CM

## 2017-11-16 NOTE — Telephone Encounter (Signed)
Order for home health speech therapy placed per Dr. [name].

## 2017-11-17 ENCOUNTER — Ambulatory Visit: Payer: PPO

## 2017-11-19 ENCOUNTER — Encounter: Payer: Self-pay | Admitting: Speech Pathology

## 2017-11-19 NOTE — Therapy (Unsigned)
[facility] [address] [place], [place], [address] Phone: [phone]   Fax:  [phone]  Patient Details  Name: Kathryn [name] MRN: [id number] Date of Birth: 1933/08/26 Referring Provider:  No ref. provider found  Encounter Date: 11/19/2017   SPEECH THERAPY DISCHARGE SUMMARY  Visits from Start of Care: 3  Current functional level related to goals / functional outcomes: See goals below   Remaining deficits: Aphasia, verbal apraxia, cognition   Education / Equipment: Non verbal compensations for aphasia; external compensations for aphasia; communication strategies for spouse Plan: Patient agrees to discharge.  Patient goals were not met. Patient is being discharged due to the patient's request.  ?????          SLP Short Term Goals - 11/19/17 1534      SLP SHORT TERM GOAL #1   Title  pt and husband will agree that pt's ability to communicate has improved by 10% by using compensation measures    Time  3    Period  Weeks or visits, for all STGs    Status  Not Met      SLP SHORT TERM GOAL #2   Title  pt will spontaneously use any compensation technique resulting in a successful communication attempt x3 in one session    Time  3    Period  Weeks    Status  Not Met      SLP Long Term Goals - 11/19/17 1535      SLP LONG TERM GOAL #1   Title  pt and/or  husband will state that pt's ability to communicate has improved by 25% since prior to initiation of ST, using compensations    Time  7    Period  Weeks or visits, for all LTGs    Status  Not Met      SLP LONG TERM GOAL #2   Title  pt will use any compensatory measure to result in successful communication with husband and SLP x5 in a session over two visits    Time  7    Period  Weeks    Status  Not Met       Lovvorn, Annye Rusk  MS, [place] 11/19/2017, 3:35 PM  [facility] [address] [place], [place], [address] Phone: [phone]   Fax:  [phone]

## 2017-11-25 ENCOUNTER — Encounter: Payer: Self-pay | Admitting: Neurology

## 2017-11-30 DIAGNOSIS — I129 Hypertensive chronic kidney disease with stage 1 through stage 4 chronic kidney disease, or unspecified chronic kidney disease: Secondary | ICD-10-CM | POA: Diagnosis not present

## 2017-11-30 DIAGNOSIS — F17210 Nicotine dependence, cigarettes, uncomplicated: Secondary | ICD-10-CM | POA: Diagnosis not present

## 2017-11-30 DIAGNOSIS — F0390 Unspecified dementia without behavioral disturbance: Secondary | ICD-10-CM | POA: Diagnosis not present

## 2017-11-30 DIAGNOSIS — F411 Generalized anxiety disorder: Secondary | ICD-10-CM | POA: Diagnosis not present

## 2017-11-30 DIAGNOSIS — R4701 Aphasia: Secondary | ICD-10-CM | POA: Diagnosis not present

## 2017-11-30 DIAGNOSIS — N183 Chronic kidney disease, stage 3 (moderate): Secondary | ICD-10-CM | POA: Diagnosis not present

## 2017-12-02 ENCOUNTER — Telehealth: Payer: Self-pay | Admitting: Neurology

## 2017-12-02 DIAGNOSIS — F411 Generalized anxiety disorder: Secondary | ICD-10-CM | POA: Diagnosis not present

## 2017-12-02 DIAGNOSIS — I129 Hypertensive chronic kidney disease with stage 1 through stage 4 chronic kidney disease, or unspecified chronic kidney disease: Secondary | ICD-10-CM | POA: Diagnosis not present

## 2017-12-02 DIAGNOSIS — N183 Chronic kidney disease, stage 3 (moderate): Secondary | ICD-10-CM | POA: Diagnosis not present

## 2017-12-02 DIAGNOSIS — F0390 Unspecified dementia without behavioral disturbance: Secondary | ICD-10-CM | POA: Diagnosis not present

## 2017-12-02 DIAGNOSIS — R4701 Aphasia: Secondary | ICD-10-CM | POA: Diagnosis not present

## 2017-12-02 DIAGNOSIS — F17210 Nicotine dependence, cigarettes, uncomplicated: Secondary | ICD-10-CM | POA: Diagnosis not present

## 2017-12-02 NOTE — Telephone Encounter (Signed)
Speech Therapist Alyson Ingles with [facility] has called for verbal orders for twice a week for 6 weeks for aphasia and cognitive communication deficits

## 2017-12-02 NOTE — Telephone Encounter (Signed)
Called Minda back and LVM regarding verbal orders. Asked for call back. Left office number & hours in message.

## 2017-12-03 NOTE — Telephone Encounter (Signed)
Received call back from [place]. She was given the verbal order for speech therapy twice per week for 6 weeks for aphasia and cognitive communication deficits. She verbalized appreciation.

## 2017-12-08 NOTE — Progress Notes (Signed)
ACUTE VISIT   HPI:  Chief Complaint  Patient presents with  . Constipation    extremely constipated for 3 days, used miralax  . Vaginal odor    noticed Monday, have been having some lower abdominal discomfort for a while now    [name] is a 84 y.o. female, who is here today with her husband, who is concerned about "severe" lower abdominal pain and constipation.  Hx of aphasia and Alzheimer's disease.  History is provided by her husband, she has difficulty describing symptoms and giving details. Not sure about type of pain. Not sure about exacerbating or alleviating factors.Husband has noted that abdominal pain improves after urination,when she also passes gas. No new medications or changes in her diet.   No changes in appetite or MS changes. No fever,chills,dysphagia,nausea,or vomiting.  She has had Miralax twice, last dose last night. She states that pain today is not bad.  She has had constipation and abdominal pain in the past.   Difficulty voiding, which she attributes to been "upset." Husband noted odorous urine. She states that when she voided urine was "fine."  Denies dysuria,increased urinary frequency, gross hematuria,or decreased urine output.  Husband is also concerned about vaginal discharge, he is not sure about color,he has noted it on her underwear for the past 3 days. She has not noted any. No vaginal bleeding.   Recently she started speech therapy for aphasia and cognitive communication deficit. She follows with Dr Jeronimo Norma of Alzheimer's disease. She has had speech difficulties,which seems to be getting worse,causing frustration.   Review of Systems  Constitutional: Negative for activity change, appetite change, fatigue and fever.  HENT: Negative for mouth sores, sore throat and trouble swallowing.   Respiratory: Negative for cough, shortness of breath and wheezing.   Gastrointestinal: Positive for abdominal pain and  constipation. Negative for abdominal distention, blood in stool, nausea and vomiting.  Endocrine: Negative for cold intolerance and heat intolerance.  Genitourinary: Positive for vaginal discharge. Negative for dysuria, hematuria and vaginal bleeding.  Musculoskeletal: Negative for back pain and myalgias.  Skin: Negative for pallor and rash.  Neurological: Negative for syncope and weakness.  Psychiatric/Behavioral: The patient is nervous/anxious.          Current Outpatient Medications on File Prior to Visit  Medication Sig Dispense Refill  . Cholecalciferol (VITAMIN D3) 2000 UNITS TABS Take 3,000 Units by mouth daily.     . Coenzyme Q10 (COQ10 PO) Take by mouth.    . folic acid (FOLVITE) 196 MCG tablet Take 400 mcg by mouth daily.    Marland Kitchen gabapentin (NEURONTIN) 100 MG capsule Take 1 capsule (100 mg total) by mouth 3 (three) times daily as needed. 270 capsule 6  . Grape Seed Extract 100 MG CAPS Take by mouth.    . NON FORMULARY MK-7 90 meq    . Omega-3 Fatty Acids (FISH OIL) 1000 MG CAPS Take by mouth.    . Potassium Gluconate 550 MG TABS Take by mouth.    . pyridoxine (B-6) 100 MG tablet Take 100 mg by mouth daily.    . rivastigmine (EXELON) 6 MG capsule Take 1 capsule (6 mg total) 2 (two) times daily by mouth. 60 capsule 11  . zinc gluconate 50 MG tablet Take 50 mg by mouth daily.     No current facility-administered medications on file prior to visit.      Past Medical History:  Diagnosis Date  . Alzheimer's dementia   . Anxiety   .  Headache   . Hypertension    Allergies  Allergen Reactions  . Aricept [Donepezil Hcl] Other (See Comments)    "giving stomach and digestive problems"  . Sulfonamide Derivatives     REACTION: Swollen joints    Social History   Socioeconomic History  . Marital status: Married    Spouse name: [name]   . Number of children: 3  . Years of education: 3  . Highest education level: Not on file  Occupational History  . Not on file  Social  Needs  . Financial resource strain: Not on file  . Food insecurity:    Worry: Not on file    Inability: Not on file  . Transportation needs:    Medical: Not on file    Non-medical: Not on file  Tobacco Use  . Smoking status: Former Smoker    Last attempt to quit: 1980    Years since quitting: 39.3  . Smokeless tobacco: Never Used  Substance and Sexual Activity  . Alcohol use: Yes    Alcohol/week: 8.4 oz    Types: 14 Glasses of wine per week    Comment: white wine  . Drug use: No  . Sexual activity: Not on file  Lifestyle  . Physical activity:    Days per week: Not on file    Minutes per session: Not on file  . Stress: Not on file  Relationships  . Social connections:    Talks on phone: Not on file    Gets together: Not on file    Attends religious service: Not on file    Active member of club or organization: Not on file    Attends meetings of clubs or organizations: Not on file    Relationship status: Not on file  Other Topics Concern  . Not on file  Social History Narrative   Lives at home with husband   Caffeine use: Drinks coffee/tea    Right handed    Vitals:   12/09/17 1008  BP: 140/78  Pulse: 78  Resp: 12  Temp: 97.8 F (36.6 C)  SpO2: 97%   Body mass index is 23.14 kg/m.   Physical Exam  Nursing note and vitals reviewed. Constitutional: She appears well-developed and well-nourished. She is cooperative. She does not appear ill. No distress.  HENT:  Head: Normocephalic and atraumatic.  Mouth/Throat: Oropharynx is clear and moist. Mucous membranes are dry.  Eyes: Conjunctivae are normal. No scleral icterus.  Cardiovascular: Normal rate and regular rhythm.  No murmur heard. Respiratory: Effort normal and breath sounds normal. No respiratory distress.  GI: Soft. Bowel sounds are normal. She exhibits no distension and no mass. There is no hepatomegaly. There is no tenderness.  Genitourinary: There is no tenderness or lesion on the right labia. There  is no tenderness or lesion on the left labia. Right adnexum displays no mass and no tenderness. Left adnexum displays no mass and no tenderness. No erythema or bleeding in the vagina. No vaginal discharge found.  Genitourinary Comments: Pelvic exam was difficult, position and discomfort. I could not see her cervix and I could not palpated. No lesions,discharge,or odor appreciated.  Bladder full.   Musculoskeletal: She exhibits no edema.  Lymphadenopathy:    She has no cervical adenopathy.  Neurological: She is alert. She has normal strength. Gait normal.  Skin: Skin is warm. No rash noted. No erythema.  Psychiatric: Her mood appears anxious.  Fairly groomed, good eye contact.    ASSESSMENT AND PLAN:  Ms. Marleny was seen today for constipation and vaginal odor.  Diagnoses and all orders for this visit: Lab Results  Component Value Date   WBC 9.4 12/09/2017   HGB 14.0 12/09/2017   HCT 40.3 12/09/2017   MCV 94.9 12/09/2017   PLT 269.0 12/09/2017   Lab Results  Component Value Date   CREATININE 1.03 12/09/2017   BUN 11 12/09/2017   NA 139 12/09/2017   K 3.8 12/09/2017   CL 101 12/09/2017   CO2 26 12/09/2017    Lower abdominal pain  Possible causes discussed. Examination today negative. I am thinking it is related to constipation. Instructed about warning signs.  -     POCT Urinalysis Dipstick (Automated) -     Basic metabolic panel -     CBC with Differential/Platelet -     DG Abd 2 Views; Future  Constipation, unspecified constipation type  Increase fluid intake. Miralax daily until she has a bowel movement then as needed. Prune juice may also help. Further recommendations will be given according to abdominal X ray.   -     Basic metabolic panel -     CBC with Differential/Platelet -     TSH -     DG Abd 2 Views; Future  Vaginal discharge  Pelvic exam neg for discharge. No lesions or odor appreciated.  Abnormal urine odor  Urine dipstick otherwise  normal. We will follow Ucx.  -     Culture, Urine      -Ms.Christella Noa and husband advised to seek immediate medical attention if sudden worsening symptoms.    Zackery Brine G. [name], MD  [facility]. [place] office.

## 2017-12-09 ENCOUNTER — Ambulatory Visit (INDEPENDENT_AMBULATORY_CARE_PROVIDER_SITE_OTHER)
Admission: RE | Admit: 2017-12-09 | Discharge: 2017-12-09 | Disposition: A | Discharge status: Home / Self Care | Payer: PPO | Source: Ambulatory Visit | Attending: Family Medicine | Admitting: Family Medicine

## 2017-12-09 ENCOUNTER — Ambulatory Visit (INDEPENDENT_AMBULATORY_CARE_PROVIDER_SITE_OTHER): Payer: PPO | Admitting: Family Medicine

## 2017-12-09 ENCOUNTER — Encounter: Payer: Self-pay | Admitting: Family Medicine

## 2017-12-09 VITALS — BP 140/78 | HR 78 | Temp 97.8°F | Resp 12 | Ht 62.0 in | Wt 126.5 lb

## 2017-12-09 DIAGNOSIS — N898 Other specified noninflammatory disorders of vagina: Secondary | ICD-10-CM

## 2017-12-09 DIAGNOSIS — K5900 Constipation, unspecified: Secondary | ICD-10-CM | POA: Diagnosis not present

## 2017-12-09 DIAGNOSIS — R1030 Lower abdominal pain, unspecified: Secondary | ICD-10-CM | POA: Diagnosis not present

## 2017-12-09 DIAGNOSIS — R8290 Unspecified abnormal findings in urine: Secondary | ICD-10-CM

## 2017-12-09 LAB — TSH: TSH: 1.54 u[IU]/mL (ref 0.35–4.50)

## 2017-12-09 LAB — BASIC METABOLIC PANEL
BUN: 11 mg/dL (ref 6–23)
CALCIUM: 9.1 mg/dL (ref 8.4–10.5)
CO2: 26 mEq/L (ref 19–32)
CREATININE: 1.03 mg/dL (ref 0.40–1.20)
Chloride: 101 mEq/L (ref 96–112)
GFR: 54.56 mL/min — AB (ref >60.00)
GLUCOSE: 97 mg/dL (ref 70–99)
Potassium: 3.8 mEq/L (ref 3.5–5.1)
SODIUM: 139 meq/L (ref 135–145)

## 2017-12-09 LAB — CBC WITH DIFFERENTIAL/PLATELET
BASOS ABS: 0 10*3/uL (ref 0.0–0.1)
Basophils Relative: 0.5 % (ref 0.0–3.0)
EOS ABS: 0.3 10*3/uL (ref 0.0–0.7)
Eosinophils Relative: 2.8 % (ref 0.0–5.0)
HCT: 40.3 % (ref 36.0–46.0)
Hemoglobin: 14 g/dL (ref 12.0–15.0)
Lymphocytes Relative: 17.3 % (ref 12.0–46.0)
Lymphs Abs: 1.6 10*3/uL (ref 0.7–4.0)
MCHC: 34.7 g/dL (ref 30.0–36.0)
MCV: 94.9 fl (ref 78.0–100.0)
Monocytes Absolute: 0.5 10*3/uL (ref 0.1–1.0)
Monocytes Relative: 5.4 % (ref 3.0–12.0)
NEUTROS PCT: 74 % (ref 43.0–77.0)
Neutro Abs: 6.9 10*3/uL (ref 1.4–7.7)
Platelets: 269 10*3/uL (ref 150.0–400.0)
RBC: 4.25 Mil/uL (ref 3.87–5.11)
RDW: 13.2 % (ref 11.5–15.5)
WBC: 9.4 10*3/uL (ref 4.0–10.5)

## 2017-12-09 LAB — POC URINALSYSI DIPSTICK (AUTOMATED)
Bilirubin, UA: NEGATIVE
GLUCOSE UA: NEGATIVE
KETONES UA: NEGATIVE
Nitrite, UA: NEGATIVE
PROTEIN UA: NEGATIVE
RBC UA: NEGATIVE
SPEC GRAV UA: 1.015 (ref 1.010–1.025)
Urobilinogen, UA: 0.2 E.U./dL
pH, UA: 6 (ref 5.0–8.0)

## 2017-12-09 NOTE — Patient Instructions (Addendum)
A few things to remember from today's visit:   Lower abdominal pain - Plan: POCT Urinalysis Dipstick (Automated), Basic metabolic panel, CBC with Differential/Platelet, DG Abd 2 Views  Constipation, unspecified constipation type - Plan: Basic metabolic panel, CBC with Differential/Platelet, TSH, DG Abd 2 Views  Vaginal discharge Daily MiraLAX, adequate fluid intake, and prune juice might help. If she feels the urge to urinate I cannot do it she needs to go to the ER.  Today X ray was ordered.  This can be done at [facility] between 8 am and 5 pm: [place]. [phone].   Please be sure medication list is accurate. If a new problem present, please set up appointment sooner than planned today.

## 2017-12-10 ENCOUNTER — Encounter: Payer: Self-pay | Admitting: Family Medicine

## 2017-12-10 LAB — URINE CULTURE
MICRO NUMBER:: 90561834
RESULT: NO GROWTH
SPECIMEN QUALITY: ADEQUATE

## 2017-12-11 ENCOUNTER — Ambulatory Visit: Payer: PPO | Admitting: Family Medicine

## 2017-12-11 ENCOUNTER — Encounter: Payer: Self-pay | Admitting: Family Medicine

## 2017-12-17 ENCOUNTER — Encounter: Payer: Self-pay | Admitting: Neurology

## 2017-12-18 ENCOUNTER — Other Ambulatory Visit: Payer: Self-pay | Admitting: Neurology

## 2017-12-18 MED ORDER — ESCITALOPRAM OXALATE 10 MG PO TABS: 10.0000 mg | ORAL_TABLET | Freq: Every day | ORAL | 6 refills | Status: DC

## 2018-01-26 ENCOUNTER — Telehealth: Payer: Self-pay | Admitting: Neurology

## 2018-01-26 NOTE — Telephone Encounter (Signed)
Minda speech therapist requesting a call back for VO, Minda wanting to extend therapy for 4 week twice a week. Also wanting a PT evaluation due to gait becoming shorter. Please contact at [phone]

## 2018-01-27 NOTE — Telephone Encounter (Signed)
Called Minda speech therapist and gave verbal orders for extended therapy for four weeks visiting twice weekly. Also verbal order given to add PT eval. She verbalized understanding and appreciation.

## 2018-01-27 NOTE — Telephone Encounter (Signed)
That's fine. thanks

## 2018-02-01 NOTE — Telephone Encounter (Signed)
Kathryn [name] with [facility] calling to get verbal orders for PT 2 times a week for 3 weeks for balance training.

## 2018-02-01 NOTE — Telephone Encounter (Signed)
Called Kathryn [name] and made her aware that Dr [name] was ok with PT orders that she recommended. She verbalized understanding and appreciated understanding.

## 2018-02-10 ENCOUNTER — Encounter: Payer: Self-pay | Admitting: Neurology

## 2018-02-12 ENCOUNTER — Other Ambulatory Visit: Payer: Self-pay

## 2018-02-12 NOTE — Patient Outreach (Addendum)
[facility]) Care Management  02/12/2018  SRISHTI STRNAD 1934/03/01 [id number]   Telephone Screen  Referral Date: 02/11/18 Referral Source: [employer] Concierge Referral Reason: " member has dementia, members POA needs some assistance with member and their care and arrangements for it. Can a Education officer, museum be assigned to this member for assistance." Insurance: [employer]   Outreach attempt # 1 to patient. Spoke with spouse(ROI on file) as patient has dementia. RN CM discussed referral reason and referral source to spouse. Spouse adamantly and multiple times reported that he did not need any type assistance providing care to his spouse. He states that he is well capable and managing just fine caring for his spouse. Spouse voices that he called [employer] concierge as he was "tired of getting the run around." He voices that he received a letter in the mail from [employer] that it was time for both him and his spouse to receive their annual in home visit. He voices that the letter had contact info for [employer]. He states that he called the number "but got nowhere" in trying to get visit scheduled. He reports that he then called [employer] concierge for assistance "and got nowhere with them either." Spouse voices concerns and frustrations in regards to the sytem and "getting the run around everywhere." He also states that he is upset in regards to patient was denied by [employer] for HHPT. He states that he has tried to file an appeal but "again they keep giving me the run around and this appears to be the norm with them." Spouse states that he just wants two things: to get home visit scheduled for them and get patient approved for HHPT. Spouse declined needing [facility] services at this time.      Plan: RN CM will close case at this time.    Enzo Montgomery, RN,BSN,CCM [place] Management Telephonic Care Management Coordinator Direct Phone: [phone] Toll Free: [phone] Fax: [phone]

## 2018-02-19 ENCOUNTER — Ambulatory Visit (INDEPENDENT_AMBULATORY_CARE_PROVIDER_SITE_OTHER): Payer: PPO | Admitting: Family Medicine

## 2018-02-19 ENCOUNTER — Encounter: Payer: Self-pay | Admitting: Family Medicine

## 2018-02-19 VITALS — BP 130/82 | HR 70 | Temp 98.1°F | Resp 12 | Ht 62.0 in | Wt 124.5 lb

## 2018-02-19 DIAGNOSIS — R4701 Aphasia: Secondary | ICD-10-CM

## 2018-02-19 DIAGNOSIS — N183 Chronic kidney disease, stage 3 unspecified: Secondary | ICD-10-CM

## 2018-02-19 DIAGNOSIS — G309 Alzheimer's disease, unspecified: Secondary | ICD-10-CM

## 2018-02-19 DIAGNOSIS — R2681 Unsteadiness on feet: Secondary | ICD-10-CM | POA: Diagnosis not present

## 2018-02-19 DIAGNOSIS — I10 Essential (primary) hypertension: Secondary | ICD-10-CM

## 2018-02-19 DIAGNOSIS — F0280 Dementia in other diseases classified elsewhere without behavioral disturbance: Secondary | ICD-10-CM | POA: Diagnosis not present

## 2018-02-19 DIAGNOSIS — F411 Generalized anxiety disorder: Secondary | ICD-10-CM | POA: Diagnosis not present

## 2018-02-19 NOTE — Patient Instructions (Addendum)
A few things to remember from today's visit:   Essential hypertension  CKD (chronic kidney disease), stage III (HCC)  Aphasia  Generalized anxiety disorder  Unstable gait - Plan: Ambulatory referral to Home Health  No changes today.  Please be sure medication list is accurate. If a new problem present, please set up appointment sooner than planned today.

## 2018-02-19 NOTE — Progress Notes (Signed)
HPI:   Kathryn [name] is a 84 y.o. female, who is here today for 6 months follow up.  Her husband helps with providing history and corroborating information she provides.  She was last seen on 12/19/17, when her husband was concerned about constipation and abdominal pain. She is reporting resolution of symptoms with dietary changes, she increased fiber intake and taking OTC MiraLAX daily as needed.  She has daily bowel movements, she has not noted blood.  Since her last visit she was also started on Lexapro 10 mg daily, which was recommended by Dr. [name] to help with anxiety. Her husband thinks medication is helping, she started medication about 7 to 8 weeks ago (12/15/2017) and has tolerated it well.  Her husband has not noted any side effect.     Hypertension:   Her husband is concerned about some elevated BPs in the past few days. BPs in general between 110s - 150/70s -80s.  Most BP in normal range.   Currently on nonpharmacologic treatment. She is been consistent with low-salt diet. CKD 3, she has not noted gross hematuria, foam in urine, or decreased urine output.   She has not complained of headache, visual changes, exertional chest pain, dyspnea,  focal weakness, or edema.   Lab Results  Component Value Date   CREATININE 1.03 12/09/2017   BUN 11 12/09/2017   NA 139 12/09/2017   K 3.8 12/09/2017   CL 101 12/09/2017   CO2 26 12/09/2017     Her husband is also concerned about effectivity of medication she is taking for Alzheimer's dementia, Exelon, which according to patient loses pharmacologic benefit after 1 to 2 years.  She is tolerating medication well. He feels like Exelon has helped some, although he states that memory has not improved.  She is concerned about Exelon causing wt gain. She is eating healthy, still exercising 3 times per week, and not snacking.  She completed 12 sections of speech therapy, which helped tremendously.  Her husband's wants  to continue speech therapy indefinitely, he states that he is "fighting" her insurance in order to do so.  Her husband also is concerned about mild weakness of right lower extremity, which was noted by speech therapist. No numbness, tingling, or dragging her foot when walking. No lower back pain.  She did not have PT arrangements through home health, according to husband, it was "declined."  She is having home health evaluation next week, which was requested by her husband and through their health insurance.   He is reporting a fall on 01/02/2018, she was walking up 2 steps and tripped.  She had no significant injuries and did not need to seek medical attention.  She has unstable gait, she is now using a cane or a walker.  She is also concerned about the possibility of a prior stroke that caused slurred speech.  According to husband, speech therapist made a comment about her slow to space and the possibility of a stroke as a etiology factor. She has no history of CVD. She follows with neurologist periodically, diagnosed with Alzheimer's disease. When symptoms first started she had work-up that included brain imaging.  Orbital he was negative for brain infarcts. Brain MRI done in 06/2015 due to memory issues and tremors showed moderate generalized cortical atrophy that is most pronounced in the masial temporal lobes.  Changes consistent with chronic microvascular ischemic changes.  The extent is typical for age.  Head CT done in 07/2016, unremarkable  CT scan of the head without contrast showing only mild age-appropriate changes of chronic microvascular ischemia and generalized atrophy.  Lab Results  Component Value Date   TSH 1.54 12/09/2017   Lab Results  Component Value Date   [id number] 950 05/28/2015     Review of Systems  Constitutional: Negative for activity change, appetite change, fatigue and fever.  HENT: Negative for mouth sores, nosebleeds and trouble swallowing.   Eyes:  Negative for redness and visual disturbance.  Respiratory: Negative for cough, shortness of breath and wheezing.   Cardiovascular: Negative for chest pain, palpitations and leg swelling.  Gastrointestinal: Negative for abdominal pain, constipation, nausea and vomiting.  Genitourinary: Negative for decreased urine volume, dysuria and hematuria.  Musculoskeletal: Positive for gait problem. Negative for myalgias.  Skin: Negative for pallor and rash.  Neurological: Positive for weakness. Negative for seizures, syncope and headaches.  Psychiatric/Behavioral: Negative for hallucinations. The patient is nervous/anxious.      Current Outpatient Medications on File Prior to Visit  Medication Sig Dispense Refill  . Cholecalciferol (VITAMIN D3) 2000 UNITS TABS Take 3,000 Units by mouth daily.     . Coenzyme Q10 (COQ10 PO) Take by mouth.    . escitalopram (LEXAPRO) 10 MG tablet Take 1 tablet (10 mg total) by mouth daily. 30 tablet 6  . folic acid (FOLVITE) 932 MCG tablet Take 400 mcg by mouth daily.    Marland Kitchen gabapentin (NEURONTIN) 100 MG capsule Take 1 capsule (100 mg total) by mouth 3 (three) times daily as needed. 270 capsule 6  . Grape Seed Extract 100 MG CAPS Take by mouth.    . NON FORMULARY MK-7 90 meq    . Omega-3 Fatty Acids (FISH OIL) 1000 MG CAPS Take by mouth.    . Potassium Gluconate 550 MG TABS Take by mouth.    . pyridoxine (B-6) 100 MG tablet Take 100 mg by mouth daily.    . rivastigmine (EXELON) 6 MG capsule Take 1 capsule (6 mg total) 2 (two) times daily by mouth. 60 capsule 11  . zinc gluconate 50 MG tablet Take 50 mg by mouth daily.     No current facility-administered medications on file prior to visit.      Past Medical History:  Diagnosis Date  . Alzheimer's dementia   . Anxiety   . Headache   . Hypertension    Past Surgical History:  Procedure Laterality Date  . BREAST BIOPSY      Allergies  Allergen Reactions  . Aricept [Donepezil Hcl] Other (See Comments)     "giving stomach and digestive problems"  . Sulfonamide Derivatives     REACTION: Swollen joints    Social History   Socioeconomic History  . Marital status: Married    Spouse name: [name]   . Number of children: 3  . Years of education: 33  . Highest education level: Not on file  Occupational History  . Not on file  Social Needs  . Financial resource strain: Not on file  . Food insecurity:    Worry: Not on file    Inability: Not on file  . Transportation needs:    Medical: Not on file    Non-medical: Not on file  Tobacco Use  . Smoking status: Former Smoker    Last attempt to quit: 1980    Years since quitting: 39.5  . Smokeless tobacco: Never Used  Substance and Sexual Activity  . Alcohol use: Yes    Alcohol/week: 8.4 oz    Types:  14 Glasses of wine per week    Comment: white wine  . Drug use: No  . Sexual activity: Not on file  Lifestyle  . Physical activity:    Days per week: Not on file    Minutes per session: Not on file  . Stress: Not on file  Relationships  . Social connections:    Talks on phone: Not on file    Gets together: Not on file    Attends religious service: Not on file    Active member of club or organization: Not on file    Attends meetings of clubs or organizations: Not on file    Relationship status: Not on file  Other Topics Concern  . Not on file  Social History Narrative   Lives at home with husband   Caffeine use: Drinks coffee/tea    Right handed    Vitals:   02/19/18 1421  BP: 130/82  Pulse: 70  Resp: 12  Temp: 98.1 F (36.7 C)  SpO2: 95%   Body mass index is 22.77 kg/m.   Wt Readings from Last 3 Encounters:  02/19/18 124 lb 8 oz (56.5 kg)  12/09/17 126 lb 8 oz (57.4 kg)  09/14/17 126 lb (57.2 kg)      Physical Exam  Nursing note and vitals reviewed. Constitutional: She appears well-developed and well-nourished. No distress.  HENT:  Head: Normocephalic and atraumatic.  Mouth/Throat: Oropharynx is clear and  moist and mucous membranes are normal.  Eyes: Pupils are equal, round, and reactive to light. Conjunctivae and EOM are normal.  Cardiovascular: Normal rate and regular rhythm.  No murmur heard. Pulses:      Dorsalis pedis pulses are 2+ on the right side, and 2+ on the left side.  Respiratory: Effort normal and breath sounds normal. No respiratory distress.  GI: Soft. She exhibits no mass. There is no hepatomegaly. There is no tenderness.  Musculoskeletal: She exhibits no edema.  Neurological: She is alert. She has normal strength. Gait abnormal.  Unstable gait without assistance. Strength otherwise symmetric on 4 extremities, 5/5. Right DTR patellar 1+, left 2+. A few times during the visit she repeated same statement.   Skin: Skin is warm. No erythema.  Psychiatric: Her mood appears anxious. Her speech is slurred (Improved.).  Well groomed, good eye contact.       ASSESSMENT AND PLAN:   Ms. BYANCA KASPER was seen today for 6 months follow-up.  Orders Placed This Encounter  Procedures  . Ambulatory referral to Home Health    1. Essential hypertension  She has had a few BPs above 140/90, ideally for her BP goal under 150/90; so I think for now she can continue nonpharmacologic treatment. Husband will be checking BP periodically and will let me know if persistent BP elevation. Continue low-salt diet. Follow-up in 6 months, before if needed.   2. CKD (chronic kidney disease), stage III (HCC)  Continue low-salt diet. Avoid NSAIDs. Adequate hydration. Further recommendations will be given according to BMP results.   3. Aphasia  Improvement with speech therapy. In regard to etiology, explained that a fascia could also be related to sinus dementia as well as other conditions.  In general initial work-up was otherwise negative.  We did review prior brain imaging, reassured about prior history of stroke, mild chronic microvascular ischemic changes, deemed to be appropriate  for her age.   4. Generalized anxiety disorder  She is tolerating Lexapro 10 mg well and her husband has noted improvement  in anxiety. She tells me that she is enjoying a glass of wine at night and she seems to be happier. No changes in current management.   5. Unstable gait  Fall prevention discussed. HH PT will be arranged.  - Ambulatory referral to [facility]  6. Alzheimer's dementia without behavioral disturbance, unspecified timing of dementia onset  She will continue current treatment, Exelon patch. She has appointment with neurologist in 06/2018. In general we discussed the clinical manifestations of the disease and treatments available as well as prognosis.     40 min face to face OV. > 50% was dedicated to discussion of Dx, prognosis, treatment options, and some side effects of medications as well as coordination of care. In the past she did not tolerate Aricept or Namenda. We have reviewed some side effects of Exelon, which does not include weight gain.  She was reassured given the fact her BMI is normal and compared to the last visit her weight is overall stable. In regard to Exelon weaning effectively after 1 to 2 years, explained that as far as I know there is not tolerance effect with prolonged use of Exelon, but I recommend discussing this concern with Dr. [name] during her next appointment, 06/15/2018. In regard to "weakness" of right lower extremity, recommend discussing this with Dr. [name], and instructed about warning signs. Her husband voices understanding and agrees with plan.       Kathryn [name] G. [name], MD  [facility]. [place] office.

## 2018-02-20 ENCOUNTER — Encounter: Payer: Self-pay | Admitting: Family Medicine

## 2018-02-27 DIAGNOSIS — I129 Hypertensive chronic kidney disease with stage 1 through stage 4 chronic kidney disease, or unspecified chronic kidney disease: Secondary | ICD-10-CM | POA: Diagnosis not present

## 2018-02-27 DIAGNOSIS — G309 Alzheimer's disease, unspecified: Secondary | ICD-10-CM | POA: Diagnosis not present

## 2018-02-27 DIAGNOSIS — N183 Chronic kidney disease, stage 3 (moderate): Secondary | ICD-10-CM | POA: Diagnosis not present

## 2018-02-27 DIAGNOSIS — F0280 Dementia in other diseases classified elsewhere without behavioral disturbance: Secondary | ICD-10-CM | POA: Diagnosis not present

## 2018-02-27 DIAGNOSIS — F411 Generalized anxiety disorder: Secondary | ICD-10-CM | POA: Diagnosis not present

## 2018-02-27 DIAGNOSIS — Z87891 Personal history of nicotine dependence: Secondary | ICD-10-CM | POA: Diagnosis not present

## 2018-02-27 DIAGNOSIS — I69320 Aphasia following cerebral infarction: Secondary | ICD-10-CM | POA: Diagnosis not present

## 2018-03-01 ENCOUNTER — Telehealth: Payer: Self-pay | Admitting: Family Medicine

## 2018-03-01 ENCOUNTER — Telehealth: Payer: Self-pay | Admitting: *Deleted

## 2018-03-01 ENCOUNTER — Telehealth: Payer: Self-pay | Admitting: Neurology

## 2018-03-01 ENCOUNTER — Encounter: Payer: Self-pay | Admitting: Family Medicine

## 2018-03-01 ENCOUNTER — Ambulatory Visit (INDEPENDENT_AMBULATORY_CARE_PROVIDER_SITE_OTHER): Payer: PPO | Admitting: Family Medicine

## 2018-03-01 VITALS — BP 126/84 | HR 62 | Temp 98.8°F | Resp 12 | Ht 62.0 in | Wt 125.4 lb

## 2018-03-01 DIAGNOSIS — H6123 Impacted cerumen, bilateral: Secondary | ICD-10-CM | POA: Diagnosis not present

## 2018-03-01 DIAGNOSIS — H9193 Unspecified hearing loss, bilateral: Secondary | ICD-10-CM | POA: Diagnosis not present

## 2018-03-01 NOTE — Telephone Encounter (Signed)
ST order forms for ST 2 week 4 and PT 1 week 1 signed by Dr. [name] and faxed back to [facility] attn: Sharon Seller. Received a receipt of confirmation.

## 2018-03-01 NOTE — Telephone Encounter (Signed)
Error

## 2018-03-01 NOTE — Telephone Encounter (Signed)
Kathryn [name] with [facility] requesting RN refax [facility] order stating she had only received 2 pages of the 3.

## 2018-03-01 NOTE — Telephone Encounter (Unsigned)
Copied from [place] [id number]. Topic: General - Other >> Mar 01, 2018  3:56 PM Judyann Munson wrote: Reason for CRM: Orin: Requesting Physical therapy: once a week for one week, two times a week for 5 weeks  Eff July 27th, One visit of evaluation for speech.    Contact number: [phone]

## 2018-03-01 NOTE — Progress Notes (Signed)
ACUTE VISIT   HPI:  Chief Complaint  Patient presents with  . Unable to hear out of left ear    started this morning    Kathryn [name] is a 84 y.o. female, who is here today with her husband complaining of bilateral hearing loss. Her husband helps with providing history.  This morning she woke up with bilateral hearing loss, R>L. Problem seems to be constant.  Her husband noted that she had TV volume higher than usual , he had to wear earplugs.  No history of ear trauma, recent travel, or recent URI.  Denies headache, ear drainage, or tinnitus. Left hearing loss improved with placing finger in ear canal. She has not identified exacerbating factors.  She has not try OTC medication.   Review of Systems  Constitutional: Negative for chills, fatigue and fever.  HENT: Positive for hearing loss. Negative for congestion, ear discharge, facial swelling, sore throat and tinnitus.   Gastrointestinal: Negative for nausea and vomiting.  Neurological: Negative for facial asymmetry and headaches.  Psychiatric/Behavioral: Negative for confusion. The patient is nervous/anxious.       Current Outpatient Medications on File Prior to Visit  Medication Sig Dispense Refill  . Cholecalciferol (VITAMIN D3) 2000 UNITS TABS Take 3,000 Units by mouth daily.     . Coenzyme Q10 (COQ10 PO) Take by mouth.    . escitalopram (LEXAPRO) 10 MG tablet Take 1 tablet (10 mg total) by mouth daily. 30 tablet 6  . folic acid (FOLVITE) 476 MCG tablet Take 400 mcg by mouth daily.    Marland Kitchen gabapentin (NEURONTIN) 100 MG capsule Take 1 capsule (100 mg total) by mouth 3 (three) times daily as needed. 270 capsule 6  . Grape Seed Extract 100 MG CAPS Take by mouth.    . NON FORMULARY MK-7 90 meq    . Omega-3 Fatty Acids (FISH OIL) 1000 MG CAPS Take by mouth.    . Potassium Gluconate 550 MG TABS Take by mouth.    . pyridoxine (B-6) 100 MG tablet Take 100 mg by mouth daily.    . rivastigmine (EXELON) 6 MG  capsule Take 1 capsule (6 mg total) 2 (two) times daily by mouth. 60 capsule 11  . zinc gluconate 50 MG tablet Take 50 mg by mouth daily.     No current facility-administered medications on file prior to visit.      Past Medical History:  Diagnosis Date  . Alzheimer's dementia   . Anxiety   . Headache   . Hypertension    Allergies  Allergen Reactions  . Aricept [Donepezil Hcl] Other (See Comments)    "giving stomach and digestive problems"  . Sulfonamide Derivatives     REACTION: Swollen joints    Social History   Socioeconomic History  . Marital status: Married    Spouse name: [name]   . Number of children: 3  . Years of education: 76  . Highest education level: Not on file  Occupational History  . Not on file  Social Needs  . Financial resource strain: Not on file  . Food insecurity:    Worry: Not on file    Inability: Not on file  . Transportation needs:    Medical: Not on file    Non-medical: Not on file  Tobacco Use  . Smoking status: Former Smoker    Last attempt to quit: 1980    Years since quitting: 39.6  . Smokeless tobacco: Never Used  Substance and Sexual  Activity  . Alcohol use: Yes    Alcohol/week: 8.4 oz    Types: 14 Glasses of wine per week    Comment: white wine  . Drug use: No  . Sexual activity: Not on file  Lifestyle  . Physical activity:    Days per week: Not on file    Minutes per session: Not on file  . Stress: Not on file  Relationships  . Social connections:    Talks on phone: Not on file    Gets together: Not on file    Attends religious service: Not on file    Active member of club or organization: Not on file    Attends meetings of clubs or organizations: Not on file    Relationship status: Not on file  Other Topics Concern  . Not on file  Social History Narrative   Lives at home with husband   Caffeine use: Drinks coffee/tea    Right handed    Vitals:   03/01/18 1609  BP: 126/84  Pulse: 62  Resp: 12  Temp: 98.8  F (37.1 C)  SpO2: 97%   Body mass index is 22.93 kg/m.   Physical Exam  Nursing note and vitals reviewed. Constitutional: She appears well-developed and well-nourished. She does not appear ill. No distress.  HENT:  Head: Normocephalic and atraumatic.  Right Ear: External ear normal.  Left Ear: External ear normal.  Mouth/Throat: Oropharynx is clear and moist and mucous membranes are normal.  Cerumen excess bilateral, I cannot see TM bilateral.  Eyes: Conjunctivae are normal.  Respiratory: Effort normal and breath sounds normal. No respiratory distress.  Lymphadenopathy:       Head (right side): No preauricular and no posterior auricular adenopathy present.       Head (left side): No preauricular and no posterior auricular adenopathy present.    She has no cervical adenopathy.  Neurological: She is alert. She has normal strength.  Mildly unstable gait with no assistance.  Skin: Skin is warm. No rash noted. No erythema.  Psychiatric: She has a normal mood and affect.  Fairly groomed, good eye contact.      ASSESSMENT AND PLAN:   Kathryn [name] was seen today for unable to hear out of left ear.  Diagnoses and all orders for this visit:  Bilateral hearing loss, unspecified hearing loss type  Bilateral impacted cerumen   After verbal consent as well as discussion of risks she agreed with having ear lavage. Ear lavage of right ear was not very well tolerated, so we decided not to proceed with left ear lavage. She is reporting improvement on hearing loss of right ear. She will continue with OTC Debrox or docusate sodium capsule content 2 to 3 drops in each at bedtime. Instructed about warning signs. Follow-up as needed.    Return if symptoms worsen or fail to improve.    Kathryn Haskew G. [name], MD  [facility]. [place] office.

## 2018-03-01 NOTE — Telephone Encounter (Signed)
Home Health Certification form signed (this included ST and PT).  Faxed with confirmation received per Brandy at [phone].  (14pgs).  Fax [phone].

## 2018-03-01 NOTE — Patient Instructions (Signed)
A few things to remember from today's visit:   Bilateral hearing loss, unspecified hearing loss type  Bilateral impacted cerumen  Over-the-counter Debrox eardrops or docusate sodium (capsule content) it is may help with wax accumulation. Avoid Q-tips.  Please be sure medication list is accurate. If a new problem present, please set up appointment sooner than planned today.

## 2018-03-02 NOTE — Telephone Encounter (Signed)
Left message for Genelle Gather to give clinic a call back for verbal orders.

## 2018-03-02 NOTE — Telephone Encounter (Signed)
Verbal orders for PT given as requested.

## 2018-03-05 ENCOUNTER — Telehealth: Payer: Self-pay | Admitting: Family Medicine

## 2018-03-05 NOTE — Telephone Encounter (Unsigned)
Copied from [place] [id number]. Topic: Quick Communication - See Telephone Encounter >> Mar 05, 2018 10:57 AM Percell Belt A wrote: CRM for notification. See Telephone encounter for: 03/05/18.  Pt husband called in and has some question about the Debrox and the Docusate Sodium that Dr [name] told pt to use.  He feel like the debrox is not working and he would like to know how long should be leaving the Docusate Sodium in her ear?    [phone] best number

## 2018-03-05 NOTE — Telephone Encounter (Signed)
Left message to return call 

## 2018-03-05 NOTE — Telephone Encounter (Signed)
Patient's husband returned call, gave instructions. Patient's husband verbalized understanding.

## 2018-03-11 DIAGNOSIS — H6123 Impacted cerumen, bilateral: Secondary | ICD-10-CM | POA: Diagnosis not present

## 2018-03-17 NOTE — Telephone Encounter (Signed)
Returned call and LVM asking for call back.

## 2018-03-17 NOTE — Telephone Encounter (Signed)
Candace with [facility] calling regarding verbal order that was faxed on 03-12-18 for Dr. [name] to sign. Please call.

## 2018-03-18 NOTE — Telephone Encounter (Signed)
Called Candace again and LVM asking for call back.   RN does not have any knowledge of receipt of any faxed verbal orders from 03-12-18.

## 2018-03-22 ENCOUNTER — Telehealth: Payer: Self-pay | Admitting: Family Medicine

## 2018-03-22 DIAGNOSIS — Z0279 Encounter for issue of other medical certificate: Secondary | ICD-10-CM

## 2018-03-22 NOTE — Telephone Encounter (Signed)
Form placed on doctor's desk for completion. 

## 2018-03-22 NOTE — Telephone Encounter (Signed)
[facility] Medical Examination Report to be filled out.  Placed in Dr;s folder.  Call [phone] upon completion.

## 2018-03-26 NOTE — Telephone Encounter (Signed)
Spoke with Kathryn [name] and informed him that the form was ready for pick up and would be at front desk. He is also aware that there is a charge for the form.

## 2018-05-08 ENCOUNTER — Other Ambulatory Visit: Payer: Self-pay | Admitting: Neurology

## 2018-06-02 ENCOUNTER — Ambulatory Visit (INDEPENDENT_AMBULATORY_CARE_PROVIDER_SITE_OTHER): Payer: PPO

## 2018-06-02 DIAGNOSIS — Z23 Encounter for immunization: Secondary | ICD-10-CM | POA: Diagnosis not present

## 2018-06-08 ENCOUNTER — Other Ambulatory Visit: Payer: Self-pay | Admitting: Neurology

## 2018-06-15 ENCOUNTER — Encounter: Payer: Self-pay | Admitting: Neurology

## 2018-06-15 ENCOUNTER — Ambulatory Visit (INDEPENDENT_AMBULATORY_CARE_PROVIDER_SITE_OTHER): Payer: PPO | Admitting: Neurology

## 2018-06-15 VITALS — BP 148/77 | HR 63 | Ht 62.0 in | Wt 125.0 lb

## 2018-06-15 DIAGNOSIS — R4701 Aphasia: Secondary | ICD-10-CM

## 2018-06-15 DIAGNOSIS — E7849 Other hyperlipidemia: Secondary | ICD-10-CM

## 2018-06-15 DIAGNOSIS — F411 Generalized anxiety disorder: Secondary | ICD-10-CM | POA: Diagnosis not present

## 2018-06-15 DIAGNOSIS — G4489 Other headache syndrome: Secondary | ICD-10-CM | POA: Diagnosis not present

## 2018-06-15 DIAGNOSIS — E785 Hyperlipidemia, unspecified: Secondary | ICD-10-CM | POA: Insufficient documentation

## 2018-06-15 DIAGNOSIS — F0390 Unspecified dementia without behavioral disturbance: Secondary | ICD-10-CM

## 2018-06-15 MED ORDER — ASPIRIN EC 81 MG PO TBEC: 81.0000 mg | DELAYED_RELEASE_TABLET | Freq: Every day | ORAL | 11 refills | Status: DC

## 2018-06-15 NOTE — Progress Notes (Signed)
[facility]    Provider:  Dr [name] Referring Provider: [name], [name], MD Primary Care Physician:  [name], [name], MD  CC: Memory changes and headaches  HPI: This is a very lovely 84year old female with memory loss. MoCA 14/30, MMSE 14/30. Likely Alzheimer's type dementia. MRi of the brain showed moderate generalized cortical atrophy that is most pronounced in the mesial temporal lobes and several T2/FLAIR hyperintense foci in the deep and subcortical white matter consistent with chronic microvascular ischemic changes.  Interval history 82/07/2018: Patient here with husband for follow up. She says she is going "great" and is pleasant. She has improved with speech with speech therapy and is going to [facility] twice a week for adult daycenter. They see her pcp and had difficulty with stomach issue which resolved.  Feels like she gained weight. She is not eating more. She is getting less exercise. Could be a medication, hard to know without stopping. Her mood is fine, will discontinue the Lexapro  Interval history 04/02/2017: Patient is here with husband for follow-up. Husband is asking my opinion on whether patient has capacity to make decisions regarding estate planning. She has difficulty due to dementia expressing but she has a basic understanding when trying to explain POA and beneficiaries - But I do think she understands when asked and explained she grasps the concepts and is able to agree or decline. If you ask her, she is able to express her wishes for inheritance, POA and other legal issues. She is very clear on her thoughts on inheritance and family members she would like to be included and how her estate should be dispersed. She also is also in agreement with her husband and has no reservations about his decision making and his concern for her well-being.  Interval history 09/04/2016: Patient continues to have headaches. CT of the head was negative for acute  intracranial abnormality. She has daily headaches. She was taking up to 7 tylenol daily and I advised him to stop as this can cause significant side effects including rebound headaches. No falls or head injury. She has a difficult time describing the headaches due to dementia. We stopped Aricept to see if this would help but it has not made any changes. They were using the tylenol and they stopped the last 2 days. The last 2 days she has not taken tylenol and she has not mentioned the headache. No double vision or neck pain. CRP and sed rate were normal. I have advised no OTC medications more than 2-3x a week to avoid rebound headaches. No previous history of migraines. Headache is in the temple areas. No vision changes or other signs of temporal arteritis. They are going to see the eye doctor. She also stopped all supplements and they are slowly adding them back. I advised we don;t exactly know what is in all these supplements and I advise them to stop if there are headaches. She has been better these last 2 days and I advised if it continues/worsens we can order an MRI brain. Headaches are with activity. A home nurse took blood pressure about 140. She has fallen twice she sits in a chair and she got up and turned too fast and fell on the floor. We could also try low dose neurontin. I recommended PT in the home for gait and safety and a home safety study and they declined, I advised safety precautions for fall risks. Husband thinks stress may be causing the headache due to her  brother's health problems and recent illness, her son has also been in the hospital twice in the last 3 months for alcoholism. Husband takes notes today.   Interval history: 03/03/2016: Here for follow up of alzheimer's dementia. Repeat moCA 12/30 (last 14/30). She is on Aricept, did not tolerate the Namenda. B12 normal. Last TSH WNL. Here with husband. She does chair yoga. Her blood pressures at home have been normal avg 133/70, elevation  in the officelikely from anxiety. She is sleeping well. There is some stress with patient's daughter which is better. They have sons with substance abuse and this has improved as well. She still sees her high school friends from [place] her HS at [employer] 4x a year, no behavioral prolems, she is lovely.   Interval history 09/04/2015: She is feeling better. She is having more bowel movements with the namenda. No diarrhea. It is not causing her discomfort, she is not dehydrated, she doesn't drink a lot of coffee, she has a great diet full of fiber. Anxiety level is better. She has projects to do at home, she is remaining active daily. She is here with her husband.   Reviewed MRi of the brain: IMPRESSION: This is an abnormal MRI of the brain without contrast showed the following: 1. Moderate generalized cortical atrophy that is most pronounced in the mesial temporal lobes. 2. Several T2/FLAIR hyperintense foci in the deep and subcortical white matter consistent with chronic microvascular ischemic changes. The extent is typical for age.  B12 and folate normal.   HPI: Kathryn [name] is a 84 y.o. female here as a referral from Dr. Justin Mend for memory loss. PMHx anxiety, HTN, fatige, headache. She lives with her husband in a single-family home. Husband is here with her. She functions well at home per patient, she cooks and likes to do yard work. She watches the news dails and enjoys watching movies. Patient says she forgets things at the store. Husband noticed the memory changes which started last October/November when they were dealing with bed bugs. Husband says directions were difficult for patient, he had to watch patient closely to make sure she followed directions. Previous to this the changes were subtle, she would forget little things. There has been a decline in memory since then. Slowly progressive. She cooks, cleans, she drives. One time the stove was left on but otherwise no accidents in the  home or in the car. She doesn't drive far, she doesn't get lost. She knows where to go around her house. She can make dinner fine. One time she forgot to add an ingredient in dinner but that was just an oversight. She is sleeping well. She writes down appointments and keep appointment cards. She has forgotten one appointment a long time ago. Husband does the finances, patient used to do the finances but husband took over because patient couldn't keep up with it. She was forgetting to pay bills. No behavioral changes except sometimes she gets aggravated at herself. No hallucinations or delusions. Mother with Alzheimers at about age 86. Husband is becoming concerned with wife's driving. She goes to yoga every week. No other focal neurologic symptoms. Older memories are intact. More recent memories impaired.   Reviewed notes, labs and imaging from outside physicians, which showed: patient was referred for evaluation of alzheimer's dementia. CMP unremarkable. TSH wnl.   Review of Systems: Patient complains of symptoms per HPI as well as the following symptoms: No fever, no chills, no CP, no sob. Pertinent negatives per HPI. All  others negative.   Review of Systems: Patient complains of symptoms per HPI as well as the following symptoms: no agitation. Pertinent negatives and positives per HPI. All others negative.   Social History   Socioeconomic History  . Marital status: Married    Spouse name: [name]   . Number of children: 3  . Years of education: 31  . Highest education level: Not on file  Occupational History  . Not on file  Social Needs  . Financial resource strain: Not on file  . Food insecurity:    Worry: Not on file    Inability: Not on file  . Transportation needs:    Medical: Not on file    Non-medical: Not on file  Tobacco Use  . Smoking status: Former Smoker    Last attempt to quit: 1980    Years since quitting: 39.8  . Smokeless tobacco: Never Used  Substance and  Sexual Activity  . Alcohol use: Yes    Alcohol/week: 14.0 standard drinks    Types: 14 Glasses of wine per week    Comment: white wine  . Drug use: No  . Sexual activity: Not on file  Lifestyle  . Physical activity:    Days per week: Not on file    Minutes per session: Not on file  . Stress: Not on file  Relationships  . Social connections:    Talks on phone: Not on file    Gets together: Not on file    Attends religious service: Not on file    Active member of club or organization: Not on file    Attends meetings of clubs or organizations: Not on file    Relationship status: Not on file  . Intimate partner violence:    Fear of current or ex partner: Not on file    Emotionally abused: Not on file    Physically abused: Not on file    Forced sexual activity: Not on file  Other Topics Concern  . Not on file  Social History Narrative   Lives at home with husband   Caffeine use: Drinks coffee/tea    Right handed   Goes to [facility] memory day care for 4 hours twice weekly    Family History  Problem Relation Age of Onset  . Thyroid disease Mother   . Alzheimer's disease Mother   . Dementia Mother   . Hearing loss Brother   . Hearing loss Brother        also developing memory problems  . Heart Problems Brother     Past Medical History:  Diagnosis Date  . Alzheimer's dementia ([place])   . Anxiety   . Headache   . Hypertension     Past Surgical History:  Procedure Laterality Date  . BREAST BIOPSY      Current Outpatient Medications  Medication Sig Dispense Refill  . Cholecalciferol (VITAMIN D3) 2000 UNITS TABS Take 3,000 Units by mouth daily.     . Coenzyme Q10 (COQ10 PO) Take by mouth.    . folic acid (FOLVITE) 160 MCG tablet Take 400 mcg by mouth daily.    Marland Kitchen gabapentin (NEURONTIN) 100 MG capsule Take 1 capsule (100 mg total) by mouth 3 (three) times daily as needed. 270 capsule 6  . Grape Seed Extract 100 MG CAPS Take by mouth.    . NON FORMULARY MK-7 90 meq     . Omega-3 Fatty Acids (FISH OIL) 1000 MG CAPS Take by mouth.    . Potassium  Gluconate 550 MG TABS Take by mouth.    . pyridoxine (B-6) 100 MG tablet Take 100 mg by mouth daily.    . rivastigmine (EXELON) 6 MG capsule TAKE 1 CAPSULE (6 MG TOTAL) 2 (TWO) TIMES DAILY BY MOUTH 60 capsule 11  . zinc gluconate 50 MG tablet Take 50 mg by mouth daily.    Marland Kitchen aspirin EC 81 MG tablet Take 1 tablet (81 mg total) by mouth daily. 30 tablet 11   No current facility-administered medications for this visit.     Allergies as of 06/15/2018 - Review Complete 06/15/2018  Allergen Reaction Noted  . Aricept [donepezil hcl] Other (See Comments) 09/04/2016  . Sulfonamide derivatives  09/23/2010    Vitals: BP (!) 148/77 (BP Location: Right Arm, Patient Position: Sitting)   Pulse 63   Ht 5\' 2"  (1.575 m)   Wt 125 lb (56.7 kg)   BMI 22.86 kg/m  Last Weight:  Wt Readings from Last 1 Encounters:  06/15/18 125 lb (56.7 kg)   Last Height:   Ht Readings from Last 1 Encounters:  06/15/18 5\' 2"  (1.575 m)         Cranial Nerves:  The pupils are equal, round, and reactive to light. Visual fields are full to finger confrontation. Extraocular movements are intact. Trigeminal sensation is intact and the muscles of mastication are normal. The face is symmetric. The palate elevates in the midline. Hearing intact. Voice is normal. Shoulder shrug is normal. The tongue has normal motion without fasciculations.   Motor Observation:  No asymmetry, no atrophy, and no involuntary movements noted. Tone:  Normal muscle tone.   Posture:  Posture is normal. normal erect   Strength:  Strength is V/V in the upper and lower limbs.    Sensation: intact to LT   Reflex Exam:  DTR's:  Deep tendon reflexes in the upper and lower extremities are normal bilaterally.  Toes: left upgoing Clonus:  Clonus is absent.   MMSE - Mini Mental State Exam 09/14/2017 12/01/2016  Orientation to  time 1 1  Orientation to Place 2 3  Registration 3 3  Attention/ Calculation 0 0  Recall 0 0  Language- name 2 objects 1 2  Language- repeat 0 1  Language- follow 3 step command 3 3  Language- read & follow direction 1 1  Write a sentence 0 0  Copy design 0 0  Total score 11 14     Assessment/Plan: This is a very lovely 84year old female with memory loss. MoCA 14/30, MMSE 14/30. Likely Alzheimer's type dementia. MRi of the brain showed moderate generalized cortical atrophy that is most pronounced in the mesial temporal lobes and several T2/FLAIR hyperintense foci in the deep and subcortical white matter consistent with chronic microvascular ischemic changes. The extent is typical for age. B12, folate and TSH WNL. She has several months of headaches in the setting of stress, CT of the head negative.  Continue Exeleon (did not tolerate Aricept) Did not tolerate the Namenda 10mg  twice daily.  No Driving Stop Lexapro, anxiety improved Headache improved - gabapentin as needed Memory stable Aphasia improved with therapy and adult daycenter HLD: Recommend a daily baby asa for stroke prevention. Also LDL is elevated, discuss with pcp about a statin for goal < 70. Stroke prevention: daily asa  Meds ordered this encounter  Medications  . aspirin EC 81 MG tablet    Sig: Take 1 tablet (81 mg total) by mouth daily.    Dispense:  30 tablet  Refill:  11    - Patient is here with husband for follow-up. Husband is asking my opinion on whether patient has capacity to make decisions regarding estate planning. She has difficulty due to dementia but she has a basic understanding when trying to explain POA and beneficiaries, I do think she  she grasps the concepts and is able to agree or decline with state planning decisions. She is able to express her wishes for inheritance, POA and other legal issues. She is very clear on her thoughts on inheritance and family members she would like to be  included and how her estate should be dispersed. She also is also in agreement with her husband and has no reservations about his decision making and his concern for her well-being.   Discussed dementia, progressive disorder, RTC as needed  Sarina Ill, MD  [facility] [address] [place], [place] [address]  Phone [phone] Fax [phone]  A total of 25 minutes was spent face-to-face with this patient. Over half this time was spent on counseling patient on the  1. Dementia without behavioral disturbance, unspecified dementia type ([place])   2. Aphasia   3. Other headache syndrome   4. Generalized anxiety disorder   5. Other hyperlipidemia     diagnosis and different diagnostic and therapeutic options available.

## 2018-06-15 NOTE — Patient Instructions (Addendum)
Start Baby Aspirin Discuss Statin for cholesterol with primary care   Memory Compensation Strategies  1. Use "WARM" strategy.  W= write it down  A= associate it  R= repeat it  M= make a mental note  2.   You can keep a Social worker.  Use a 3-ring notebook with sections for the following: calendar, important names and phone numbers,  medications, doctors' names/phone numbers, lists/reminders, and a section to journal what you did  each day.   3.    Use a calendar to write appointments down.  4.    Write yourself a schedule for the day.  This can be placed on the calendar or in a separate section of the Memory Notebook.  Keeping a  regular schedule can help memory.  5.    Use medication organizer with sections for each day or morning/evening pills.  You may need help loading it  6.    Keep a basket, or pegboard by the door.  Place items that you need to take out with you in the basket or on the pegboard.  You may also want to  include a message board for reminders.  7.    Use sticky notes.  Place sticky notes with reminders in a place where the task is performed.  For example: " turn off the  stove" placed by the stove, "lock the door" placed on the door at eye level, " take your medications" on  the bathroom mirror or by the place where you normally take your medications.  8.    Use alarms/timers.  Use while cooking to remind yourself to check on food or as a reminder to take your medicine, or as a  reminder to make a call, or as a reminder to perform another task, etc.    Stroke Prevention Some health problems and behaviors may make it more likely for you to have a stroke. Below are ways to lessen your risk of having a stroke.  Be active for at least 30 minutes on most or all days.  Do not smoke. Try not to be around others who smoke.  Do not drink too much alcohol. ? Do not have more than 2 drinks a day if you are a man. ? Do not have more than 1 drink a day if you are  a woman and are not pregnant.  Eat healthy foods, such as fruits and vegetables. If you were put on a specific diet, follow the diet as told.  Keep your cholesterol levels under control through diet and medicines. Look for foods that are low in saturated fat, trans fat, cholesterol, and are high in fiber.  If you have diabetes, follow all diet plans and take your medicine as told.  Ask your doctor if you need treatment to lower your blood pressure. If you have high blood pressure (hypertension), follow all diet plans and take your medicine as told by your doctor.  If you are 21-28 years old, have your blood pressure checked every 3-5 years. If you are age 15 or older, have your blood pressure checked every year.  Keep a healthy weight. Eat foods that are low in calories, salt, saturated fat, trans fat, and cholesterol.  Do not take drugs.  Avoid birth control pills, if this applies. Talk to your doctor about the risks of taking birth control pills.  Talk to your doctor if you have sleep problems (sleep apnea).  Take all medicine as told by your doctor. ?  You may be told to take aspirin or blood thinner medicine. Take this medicine as told by your doctor. ? Understand your medicine instructions.  Make sure any other conditions you have are being taken care of.  Get help right away if:  You suddenly lose feeling (you feel numb) or have weakness in your face, arm, or leg.  Your face or eyelid hangs down to one side.  You suddenly feel confused.  You have trouble talking (aphasia) or understanding what people are saying.  You suddenly have trouble seeing in one or both eyes.  You suddenly have trouble walking.  You are dizzy.  You lose your balance or your movements are clumsy (uncoordinated).  You suddenly have a very bad headache and you do not know the cause.  You have new chest pain.  Your heart feels like it is fluttering or skipping a beat (irregular heartbeat). Do  not wait to see if the symptoms above go away. Get help right away. Call your local emergency services (911 in [place] Do not drive yourself to the hospital. This information is not intended to replace advice given to you by your health care provider. Make sure you discuss any questions you have with your health care provider. Document Released: 01/20/2012 Document Revised: 12/27/2015 Document Reviewed: 01/21/2013 Elsevier Interactive Patient Education  [employer].

## 2018-06-22 ENCOUNTER — Telehealth: Payer: Self-pay | Admitting: *Deleted

## 2018-06-22 NOTE — Telephone Encounter (Signed)
Left voicemail informing [facility] that Dr. [name] is still working on forms and as soon as they are complete, I would be faxing them over.  Copied from [place] [id number]. Topic: General - Other >> Jun 22, 2018  1:51 PM Antonieta Iba C wrote: Reason for CRM: [facility] is calling in to follow up on paperwork that was faxed. Please advise of the status. They will be re-faxing to PCP today   CB: [phone]

## 2018-06-24 ENCOUNTER — Telehealth: Payer: Self-pay | Admitting: Family Medicine

## 2018-06-24 NOTE — Telephone Encounter (Signed)
Copied from [place] [id number]. Topic: General - Other >> Jun 24, 2018 11:58 AM Kathryn [name], NT wrote: Reason for CRM: Patient's husband  called and said a in home Dr  from [facility] , came to their house yesterday and said the patient has heart murmer ,and a weak right leg and also when the needle point was done on her hands she was unable to feel anything . He also says [facility] says that she has alzheimer .and that they have requested information from th practice with a fax so she can go into an  upgraded status with [facility]. Kathryn [name] would also like  know if he should bring her in for a follow up visit before her appointment on 08/23/18, He says he is ok response form my chart

## 2018-06-25 NOTE — Telephone Encounter (Signed)
Spoke with Mr. Critcher, he scheduled appointment for patient to come in on 06/29/18 to discuss with Dr. [name]. No further assistance needed at this time.

## 2018-06-29 ENCOUNTER — Ambulatory Visit (INDEPENDENT_AMBULATORY_CARE_PROVIDER_SITE_OTHER): Payer: PPO | Admitting: Family Medicine

## 2018-06-29 ENCOUNTER — Ambulatory Visit: Payer: PPO | Admitting: Family Medicine

## 2018-06-29 ENCOUNTER — Encounter: Payer: Self-pay | Admitting: Family Medicine

## 2018-06-29 VITALS — BP 112/66 | HR 61 | Temp 97.6°F | Resp 12 | Ht 62.0 in | Wt 125.1 lb

## 2018-06-29 DIAGNOSIS — R32 Unspecified urinary incontinence: Secondary | ICD-10-CM

## 2018-06-29 DIAGNOSIS — N183 Chronic kidney disease, stage 3 unspecified: Secondary | ICD-10-CM

## 2018-06-29 DIAGNOSIS — R2681 Unsteadiness on feet: Secondary | ICD-10-CM

## 2018-06-29 DIAGNOSIS — I10 Essential (primary) hypertension: Secondary | ICD-10-CM

## 2018-06-29 DIAGNOSIS — G4489 Other headache syndrome: Secondary | ICD-10-CM | POA: Diagnosis not present

## 2018-06-29 DIAGNOSIS — F0390 Unspecified dementia without behavioral disturbance: Secondary | ICD-10-CM | POA: Diagnosis not present

## 2018-06-29 DIAGNOSIS — R0989 Other specified symptoms and signs involving the circulatory and respiratory systems: Secondary | ICD-10-CM

## 2018-06-29 MED ORDER — MUPIROCIN 2 % EX OINT: 1.0000 "application " | TOPICAL_OINTMENT | Freq: Two times a day (BID) | CUTANEOUS | 0 refills | Status: DC

## 2018-06-29 NOTE — Assessment & Plan Note (Signed)
Problem has been stable. Continue low-salt diet. Avoid NSAIDs. Adequate hydration. Further recommendation will be given according to BMP and microalbumin/creatinine ratio results.

## 2018-06-29 NOTE — Patient Instructions (Addendum)
A few things to remember from today's visit:   Essential hypertension  Unstable gait  CKD (chronic kidney disease), stage III ([place]) - Plan: Microalbumin / creatinine urine ratio, Basic metabolic panel  Decreased pulses in feet - Plan: VAS [place] ABI WITH/WO TBI  Urinary incontinence, unspecified type - Plan: Urinalysis, Routine w reflex microscopic  Fall precautions.  Please be sure medication list is accurate. If a new problem present, please set up appointment sooner than planned today.

## 2018-06-29 NOTE — Assessment & Plan Note (Signed)
BP adequately controlled on nonpharmacologic treatment. Continue low-salt diet.

## 2018-06-29 NOTE — Progress Notes (Signed)
HPI:   [name] is a 84 y.o. female, who is here today with her husband for 6 months follow up.   She was last seen on 03/01/18. Husband provides most of the history today.  Since her last visit she has follow with her neurologist, Dr. [name]. According to her husband, dementia seems to be getting worse. She is attending adult daycare twice per week.  He states that it was recommended to decrease gabapentin dose from 100 mg 3 times daily to twice daily, which he did for a few days but increase it back to 3 times per day because "things were not going well."  He is not specific about symptoms she was having when gabapentin dose was decreased, "I do not know" "it is why I am here."  Gabapentin was started to manage daily headaches, since she has been on this medication headaches have been well controlled. He has not noted side effects from medication.  She is also on Exelon 6 mg twice daily.  He is also concerned about some findings during recent home visit through her insurance. According to husband, Dr. [name] visited patient at home for examination. concern about not being able to collect a urine sample, decreased sensation on hands, and question of heart murmur.  She has not had focal weakness, chest pain, dyspnea, or diaphoresis. Her husband is also concerned about episode of "tiredness and weakness" this morning when she first got up.  She felt better after going back to sleep for a few hours.  Last visit her husband was concerned about constipation, this problem has resolved. He mention 2 episodes of urine incontinence since 03/2018 while she was at daycare. Negative for dysuria, gross hematuria, increasing frequency, vaginal discharge, or vaginal bleeding.  CKD and hypertension: Currently she is on nonpharmacologic treatment. Negative for decreased urine output, elevated BP, edema, or foamy urine.   Lab Results  Component Value Date   CREATININE 1.03  12/09/2017   BUN 11 12/09/2017   NA 139 12/09/2017   K 3.8 12/09/2017   CL 101 12/09/2017   CO2 26 12/09/2017    Review of Systems  Constitutional: Positive for fatigue. Negative for activity change, appetite change and fever.  HENT: Negative for mouth sores, nosebleeds and trouble swallowing.   Respiratory: Negative for cough, shortness of breath and wheezing.   Cardiovascular: Negative for chest pain, palpitations and leg swelling.  Gastrointestinal: Negative for abdominal pain, nausea and vomiting.       Negative for changes in bowel habits.  Genitourinary: Negative for decreased urine volume, dysuria and hematuria.  Musculoskeletal: Positive for arthralgias and gait problem.  Skin: Negative for color change and rash.  Neurological: Positive for weakness. Negative for syncope and headaches.  Psychiatric/Behavioral: Positive for confusion. The patient is nervous/anxious.      Current Outpatient Medications on File Prior to Visit  Medication Sig Dispense Refill  . aspirin EC 81 MG tablet Take 1 tablet (81 mg total) by mouth daily. 30 tablet 11  . Cholecalciferol (VITAMIN D3) 2000 UNITS TABS Take 3,000 Units by mouth daily.     . Coenzyme Q10 (COQ10 PO) Take by mouth.    . folic acid (FOLVITE) 914 MCG tablet Take 400 mcg by mouth daily.    Marland Kitchen gabapentin (NEURONTIN) 100 MG capsule Take 1 capsule (100 mg total) by mouth 3 (three) times daily as needed. 270 capsule 6  . Grape Seed Extract 100 MG CAPS Take by mouth.    Marland Kitchen  NON FORMULARY MK-7 90 meq    . Omega-3 Fatty Acids (FISH OIL) 1000 MG CAPS Take by mouth.    . Potassium Gluconate 550 MG TABS Take by mouth.    . pyridoxine (B-6) 100 MG tablet Take 100 mg by mouth daily.    . rivastigmine (EXELON) 6 MG capsule TAKE 1 CAPSULE (6 MG TOTAL) 2 (TWO) TIMES DAILY BY MOUTH 60 capsule 11  . zinc gluconate 50 MG tablet Take 50 mg by mouth daily.     No current facility-administered medications on file prior to visit.      Past Medical  History:  Diagnosis Date  . Alzheimer's dementia ([place])   . Anxiety   . Headache   . Hypertension    Allergies  Allergen Reactions  . Aricept [Donepezil Hcl] Other (See Comments)    "giving stomach and digestive problems"  . Sulfonamide Derivatives     REACTION: Swollen joints    Social History   Socioeconomic History  . Marital status: Married    Spouse name: [name]   . Number of children: 3  . Years of education: 50  . Highest education level: Not on file  Occupational History  . Not on file  Social Needs  . Financial resource strain: Not on file  . Food insecurity:    Worry: Not on file    Inability: Not on file  . Transportation needs:    Medical: Not on file    Non-medical: Not on file  Tobacco Use  . Smoking status: Former Smoker    Last attempt to quit: 1980    Years since quitting: 39.9  . Smokeless tobacco: Never Used  Substance and Sexual Activity  . Alcohol use: Yes    Alcohol/week: 14.0 standard drinks    Types: 14 Glasses of wine per week    Comment: white wine  . Drug use: No  . Sexual activity: Not on file  Lifestyle  . Physical activity:    Days per week: Not on file    Minutes per session: Not on file  . Stress: Not on file  Relationships  . Social connections:    Talks on phone: Not on file    Gets together: Not on file    Attends religious service: Not on file    Active member of club or organization: Not on file    Attends meetings of clubs or organizations: Not on file    Relationship status: Not on file  Other Topics Concern  . Not on file  Social History Narrative   Lives at home with husband   Caffeine use: Drinks coffee/tea    Right handed   Goes to [employer] memory day care for 4 hours twice weekly    Vitals:   06/29/18 1444  BP: 112/66  Pulse: 61  Resp: 12  Temp: 97.6 F (36.4 C)  SpO2: 99%   Body mass index is 22.89 kg/m.   Physical Exam  Nursing note and vitals reviewed. Constitutional: She appears  well-developed and well-nourished. No distress.  HENT:  Head: Normocephalic and atraumatic.  Mouth/Throat: Oropharynx is clear and moist and mucous membranes are normal.  Eyes: Pupils are equal, round, and reactive to light. Conjunctivae are normal.  Cardiovascular: Normal rate and regular rhythm.  No murmur heard. Pulses:      Dorsalis pedis pulses are 2+ on the right side, and 1+ on the left side.       Posterior tibial pulses are 2+ on the right  side, and 1+ on the left side.  Respiratory: Effort normal and breath sounds normal. No respiratory distress.  GI: Soft. She exhibits no mass. There is no hepatomegaly. There is no tenderness.  Musculoskeletal: She exhibits no edema.  Lymphadenopathy:    She has no cervical adenopathy.  Neurological: She is alert. She has normal strength. Gait abnormal.  Reflex Scores:      Bicep reflexes are 2+ on the right side and 2+ on the left side.      Patellar reflexes are 2+ on the right side and 2+ on the left side. Monofilament examination + strength of hands normal bilateral. Pronator drift negative. Mildly unstable gait without assistance.  Skin: Skin is warm. No rash noted. No erythema.  Psychiatric: Her mood appears anxious.  Well groomed, good eye contact.      ASSESSMENT AND PLAN:   Ms. TANAKA GILLEN was seen today for 6 months follow-up.  Orders Placed This Encounter  Procedures  . Urinalysis, Routine w reflex microscopic  . Microalbumin / creatinine urine ratio  . Basic metabolic panel   Lab Results  Component Value Date   CREATININE 1.14 06/29/2018   BUN 21 06/29/2018   NA 142 06/29/2018   K 3.8 06/29/2018   CL 104 06/29/2018   CO2 30 06/29/2018   Lab Results  Component Value Date   MICROALBUR 1.6 06/29/2018    Decreased pulses in feet Noticed on cyanosis/cold great toe and second toe left foot.  Her husband is not sure for how long this has been going on, it seems to be a new problem. She has not can be  complaining of pain. I had difficulty finding peripheral pulses of left lower extremity.   Clearly instructed about warning signs. ABI will be arranged.  - VAS [place] ABI WITH/WO TBI; Future  Urinary incontinence, unspecified type Possible etiology discussed. Because this is a new problem UA was ordered today, further recommendation will be given accordingly.  - Urinalysis, Routine w reflex microscopic  Dementia Gradually getting worse.  Currently on Exelon 6 mg twice daily. Continue following with neurologist.  CKD (chronic kidney disease), stage III Problem has been stable. Continue low-salt diet. Avoid NSAIDs. Adequate hydration. Further recommendation will be given according to BMP and microalbumin/creatinine ratio results.  Headache Problem has been well controlled since she started gabapentin 100 mg 3 times daily. We discussed some side effects of gabapentin, including risk for falls. Suggest to try to decrease gabapentin to 2 tablets daily as recommended and to monitor for worsening headaches.  It is ideal to be on the lower amount of medication if possible to control problem.  Unstable gait Continue PT at home. Fall precautions discussed.  HTN (hypertension) BP adequately controlled on nonpharmacologic treatment. Continue low-salt diet.   On examination today I do not appreciate heart murmur or focal weakness on examination.    [name]. [name], MD  [facility]. [place] office.

## 2018-06-29 NOTE — Assessment & Plan Note (Signed)
Gradually getting worse.  Currently on Exelon 6 mg twice daily. Continue following with neurologist.

## 2018-06-29 NOTE — Assessment & Plan Note (Addendum)
Problem has been well controlled since she started gabapentin 100 mg 3 times daily. We discussed some side effects of gabapentin, including risk for falls. Suggest to try to decrease gabapentin to 2 tablets daily as recommended and to monitor for worsening headaches.  It is ideal to be on the lower amount of medication if possible to control problem.

## 2018-06-29 NOTE — Assessment & Plan Note (Signed)
Continue PT at home. Fall precautions discussed.

## 2018-06-30 LAB — BASIC METABOLIC PANEL
BUN: 21 mg/dL (ref 6–23)
CALCIUM: 9.5 mg/dL (ref 8.4–10.5)
CO2: 30 meq/L (ref 19–32)
CREATININE: 1.14 mg/dL (ref 0.40–1.20)
Chloride: 104 mEq/L (ref 96–112)
GFR: 48.47 mL/min — AB (ref >60.00)
GLUCOSE: 87 mg/dL (ref 70–99)
Potassium: 3.8 mEq/L (ref 3.5–5.1)
Sodium: 142 mEq/L (ref 135–145)

## 2018-06-30 LAB — URINALYSIS, ROUTINE W REFLEX MICROSCOPIC
BILIRUBIN URINE: NEGATIVE
Ketones, ur: NEGATIVE
NITRITE: NEGATIVE
Specific Gravity, Urine: 1.02 (ref 1.000–1.030)
TOTAL PROTEIN, URINE-UPE24: NEGATIVE
URINE GLUCOSE: NEGATIVE
UROBILINOGEN UA: 0.2 (ref 0.0–1.0)
pH: 5.5 (ref 5.0–8.0)

## 2018-06-30 LAB — MICROALBUMIN / CREATININE URINE RATIO
Creatinine,U: 137.1 mg/dL
MICROALB UR: 1.6 mg/dL (ref 0.0–1.9)
Microalb Creat Ratio: 1.2 mg/g (ref 0.0–30.0)

## 2018-07-03 ENCOUNTER — Encounter: Payer: Self-pay | Admitting: Family Medicine

## 2018-07-13 ENCOUNTER — Inpatient Hospital Stay (HOSPITAL_COMMUNITY): Admission: RE | Admit: 2018-07-13 | Payer: PPO | Source: Ambulatory Visit

## 2018-07-14 ENCOUNTER — Ambulatory Visit (HOSPITAL_COMMUNITY)
Admission: RE | Admit: 2018-07-14 | Discharge: 2018-07-14 | Disposition: A | Discharge status: Home / Self Care | Payer: PPO | Source: Ambulatory Visit | Attending: Cardiovascular Disease | Admitting: Cardiovascular Disease

## 2018-07-14 DIAGNOSIS — R0989 Other specified symptoms and signs involving the circulatory and respiratory systems: Secondary | ICD-10-CM | POA: Diagnosis not present

## 2018-07-19 ENCOUNTER — Encounter: Payer: Self-pay | Admitting: Family Medicine

## 2018-07-20 ENCOUNTER — Ambulatory Visit (INDEPENDENT_AMBULATORY_CARE_PROVIDER_SITE_OTHER): Payer: PPO | Admitting: Family Medicine

## 2018-07-20 ENCOUNTER — Encounter (HOSPITAL_COMMUNITY): Payer: Self-pay | Admitting: Emergency Medicine

## 2018-07-20 ENCOUNTER — Encounter: Payer: Self-pay | Admitting: Family Medicine

## 2018-07-20 ENCOUNTER — Emergency Department (HOSPITAL_COMMUNITY): Payer: PPO

## 2018-07-20 ENCOUNTER — Emergency Department (HOSPITAL_COMMUNITY)
Admission: EM | Admit: 2018-07-20 | Discharge: 2018-07-20 | Disposition: A | Discharge status: Home / Self Care | Payer: PPO | Attending: Emergency Medicine | Admitting: Emergency Medicine

## 2018-07-20 ENCOUNTER — Ambulatory Visit: Payer: Self-pay

## 2018-07-20 VITALS — BP 130/83 | HR 79 | Temp 97.6°F | Resp 16 | Ht 62.0 in | Wt 125.2 lb

## 2018-07-20 DIAGNOSIS — I739 Peripheral vascular disease, unspecified: Secondary | ICD-10-CM | POA: Diagnosis not present

## 2018-07-20 DIAGNOSIS — S61401A Unspecified open wound of right hand, initial encounter: Secondary | ICD-10-CM | POA: Diagnosis not present

## 2018-07-20 DIAGNOSIS — Z87891 Personal history of nicotine dependence: Secondary | ICD-10-CM | POA: Insufficient documentation

## 2018-07-20 DIAGNOSIS — F0280 Dementia in other diseases classified elsewhere without behavioral disturbance: Secondary | ICD-10-CM | POA: Insufficient documentation

## 2018-07-20 DIAGNOSIS — G309 Alzheimer's disease, unspecified: Secondary | ICD-10-CM | POA: Diagnosis not present

## 2018-07-20 DIAGNOSIS — Y92003 Bedroom of unspecified non-institutional (private) residence as the place of occurrence of the external cause: Secondary | ICD-10-CM | POA: Diagnosis not present

## 2018-07-20 DIAGNOSIS — S0990XA Unspecified injury of head, initial encounter: Secondary | ICD-10-CM | POA: Diagnosis not present

## 2018-07-20 DIAGNOSIS — F419 Anxiety disorder, unspecified: Secondary | ICD-10-CM | POA: Insufficient documentation

## 2018-07-20 DIAGNOSIS — Y998 Other external cause status: Secondary | ICD-10-CM | POA: Insufficient documentation

## 2018-07-20 DIAGNOSIS — Y92009 Unspecified place in unspecified non-institutional (private) residence as the place of occurrence of the external cause: Secondary | ICD-10-CM

## 2018-07-20 DIAGNOSIS — S61511A Laceration without foreign body of right wrist, initial encounter: Secondary | ICD-10-CM | POA: Insufficient documentation

## 2018-07-20 DIAGNOSIS — Z7982 Long term (current) use of aspirin: Secondary | ICD-10-CM | POA: Diagnosis not present

## 2018-07-20 DIAGNOSIS — Z79899 Other long term (current) drug therapy: Secondary | ICD-10-CM | POA: Insufficient documentation

## 2018-07-20 DIAGNOSIS — I129 Hypertensive chronic kidney disease with stage 1 through stage 4 chronic kidney disease, or unspecified chronic kidney disease: Secondary | ICD-10-CM | POA: Insufficient documentation

## 2018-07-20 DIAGNOSIS — W19XXXA Unspecified fall, initial encounter: Secondary | ICD-10-CM

## 2018-07-20 DIAGNOSIS — W06XXXA Fall from bed, initial encounter: Secondary | ICD-10-CM | POA: Insufficient documentation

## 2018-07-20 DIAGNOSIS — Y9389 Activity, other specified: Secondary | ICD-10-CM | POA: Diagnosis not present

## 2018-07-20 DIAGNOSIS — S61411A Laceration without foreign body of right hand, initial encounter: Secondary | ICD-10-CM | POA: Diagnosis not present

## 2018-07-20 DIAGNOSIS — N183 Chronic kidney disease, stage 3 (moderate): Secondary | ICD-10-CM | POA: Diagnosis not present

## 2018-07-20 DIAGNOSIS — S199XXA Unspecified injury of neck, initial encounter: Secondary | ICD-10-CM | POA: Diagnosis not present

## 2018-07-20 DIAGNOSIS — Z23 Encounter for immunization: Secondary | ICD-10-CM | POA: Diagnosis not present

## 2018-07-20 MED ORDER — TETANUS-DIPHTH-ACELL PERTUSSIS 5-2.5-18.5 LF-MCG/0.5 IM SUSP
0.5000 mL | Freq: Once | INTRAMUSCULAR | Status: AC
  Administered 2018-07-20: 0.5 mL via INTRAMUSCULAR
  Filled 2018-07-20: qty 0.5

## 2018-07-20 MED ORDER — LIDOCAINE HCL (PF) 1 % IJ SOLN
10.0000 mL | Freq: Once | INTRAMUSCULAR | Status: AC
  Administered 2018-07-20: 10 mL via INTRADERMAL
  Filled 2018-07-20: qty 30

## 2018-07-20 NOTE — ED Provider Notes (Signed)
[facility] DEPT Provider Note   CSN: [id number] Arrival date & time: 07/20/18  1132     History   Chief Complaint Chief Complaint  Patient presents with  . Laceration  . Fall    HPI Kathryn [name] is a 84 y.o. female who was sent from her primary care physician's office for laceration of the right hand that occurred about 10 AM this morning.  She has a hx of Dementia and hx is given by her husband who is at bedside. Patient had a mechanical fall.  Her husband did not witness it however he found her on the carpeted floor with a bloodied hand and some abrasion around the right eye.  She did not lose consciousness.  He brought her to their primary care's office however the patient's PCP was unable to handle the wound and sent her to the emergency department.  Patient has a history of Reynaud's syndrome.   HPI  Past Medical History:  Diagnosis Date  . Alzheimer's dementia ([place])   . Anxiety   . Headache   . Hypertension     Patient Active Problem List   Diagnosis Date Noted  . PAD (peripheral artery disease) ([place]) 07/20/2018  . HLD (hyperlipidemia) 06/15/2018  . Unstable gait 02/19/2018  . Aphasia 09/14/2017  . Headache 07/10/2016  . CKD (chronic kidney disease), stage III ([place]) 06/20/2016  . Dementia ([place]) 09/04/2015  . Generalized anxiety disorder 05/29/2015  . HTN (hypertension) 05/29/2015  . Memory loss 05/28/2015    Past Surgical History:  Procedure Laterality Date  . BREAST BIOPSY       OB History   No obstetric history on file.      Home Medications    Prior to Admission medications   Medication Sig Start Date End Date Taking? Authorizing Provider  aspirin EC 81 MG tablet Take 1 tablet (81 mg total) by mouth daily. 06/15/18   Melvenia Beam, MD  Cholecalciferol (VITAMIN D3) 2000 UNITS TABS Take 3,000 Units by mouth daily.     [provider]  Coenzyme Q10 (COQ10 PO) Take by mouth.    [provider]    folic acid (FOLVITE) 474 MCG tablet Take 400 mcg by mouth daily.    [provider]  gabapentin (NEURONTIN) 100 MG capsule Take 1 capsule (100 mg total) by mouth 3 (three) times daily as needed. 09/14/17   Melvenia Beam, MD  Grape Seed Extract 100 MG CAPS Take by mouth.    [provider]  mupirocin ointment (BACTROBAN) 2 % APPLY TO AFFECTED AREA TWICE A DAY FOR 10 DAYS 06/29/18   [provider]  NON FORMULARY MK-7 90 meq    [provider]  Omega-3 Fatty Acids (FISH OIL) 1000 MG CAPS Take by mouth.    [provider]  Potassium Gluconate 550 MG TABS Take by mouth.    [provider]  pyridoxine (B-6) 100 MG tablet Take 100 mg by mouth daily.    [provider]  rivastigmine (EXELON) 6 MG capsule TAKE 1 CAPSULE (6 MG TOTAL) 2 (TWO) TIMES DAILY BY MOUTH 06/08/18   Melvenia Beam, MD  zinc gluconate 50 MG tablet Take 50 mg by mouth daily.    [provider]    Family History Family History  Problem Relation Age of Onset  . Thyroid disease Mother   . Alzheimer's disease Mother   . Dementia Mother   . Hearing loss Brother   . Hearing  loss Brother        also developing memory problems  . Heart Problems Brother     Social History Social History   Tobacco Use  . Smoking status: Former Smoker    Last attempt to quit: 1980    Years since quitting: 39.9  . Smokeless tobacco: Never Used  Substance Use Topics  . Alcohol use: Yes    Alcohol/week: 14.0 standard drinks    Types: 14 Glasses of wine per week    Comment: white wine  . Drug use: No     Allergies   Aricept [donepezil hcl] and Sulfonamide derivatives   Review of Systems Review of Systems  Unable to perform ROS: Dementia    . Physical Exam Updated Vital Signs BP (!) 159/71 (BP Location: Right Arm)   Pulse 75   Temp (!) 97.4 F (36.3 C) (Oral)   Resp 12   SpO2 96%   Physical Exam Vitals signs and nursing note reviewed.   Constitutional:      General: She is not in acute distress.    Appearance: She is well-developed. She is not diaphoretic.  HENT:     Head: Normocephalic.     Comments: Minimal abrasion around the right eye. No ttp,crepitus or stepoff    Nose: Nose normal.     Mouth/Throat:     Mouth: Mucous membranes are moist.  Eyes:     General: No scleral icterus.    Extraocular Movements: Extraocular movements intact.     Conjunctiva/sclera: Conjunctivae normal.  Neck:     Musculoskeletal: Normal range of motion.  Cardiovascular:     Rate and Rhythm: Normal rate and regular rhythm.     Heart sounds: Normal heart sounds. No murmur. No friction rub. No gallop.   Pulmonary:     Effort: Pulmonary effort is normal. No respiratory distress.     Breath sounds: Normal breath sounds.  Abdominal:     General: Bowel sounds are normal. There is no distension.     Palpations: Abdomen is soft. There is no mass.     Tenderness: There is no abdominal tenderness. There is no guarding.  Skin:    General: Skin is warm and dry.     Findings: Bruising present.     Comments: R hand with 7 cm skin tear over the volar wrist on the radial side. FROM of wrist and fingers. Bruising over the dorsum of the hand  Neurological:     Mental Status: She is alert and oriented to person, place, and time.  Psychiatric:        Behavior: Behavior normal.      ED Treatments / Results  Labs (all labs ordered are listed, but only abnormal results are displayed) Labs Reviewed - No data to display  EKG None  Radiology Dg Hand Complete Right  Result Date: 07/20/2018 CLINICAL DATA:  [place] from bed today with laceration of the hand. EXAM: RIGHT HAND - COMPLETE 3+ VIEW COMPARISON:  None. FINDINGS: Advanced arthritis of the inter phalangeal joints. Osteoarthritis of the first carpometacarpal joint. No evidence of acute fracture or dislocation. No sign of radiopaque foreign object. IMPRESSION: No acute or traumatic finding.  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 Electronically Signed   By: Nelson Chimes M.D.   On: 07/20/2018 12:33    Procedures .Marland KitchenLaceration Repair Date/Time: 07/20/2018 5:21 PM Performed by: Margarita Mail, PA-C Authorized by: Margarita Mail, PA-C   Consent:    Consent obtained:  Verbal  Consent given by:  Patient and spouse   Risks discussed:  Infection, pain, poor cosmetic result, poor wound healing and need for additional repair   Alternatives discussed:  No treatment Anesthesia (see MAR for exact dosages):    Anesthesia method:  Local infiltration   Local anesthetic:  Lidocaine 1% w/o epi (10 ml) Laceration details:    Location:  Hand   Hand location:  R wrist   Length (cm):  7   Depth (mm):  3 (3) Repair type:    Repair type:  Simple Pre-procedure details:    Preparation:  Patient was prepped and draped in usual sterile fashion Treatment:    Area cleansed with:  Betadine and saline   Amount of cleaning:  Standard   Irrigation solution:  Sterile saline Skin repair:    Repair method:  Sutures and Steri-Strips   Suture size:  5-0   Suture material:  Nylon   Suture technique:  Simple interrupted   Number of sutures:  4   Number of Steri-Strips:  4 Approximation:    Approximation:  Close Post-procedure details:    Dressing:  Bulky dressing   Patient tolerance of procedure:  Tolerated well, no immediate complications   (including critical care time)  Medications Ordered in ED Medications  lidocaine (PF) (XYLOCAINE) 1 % injection 10 mL (has no administration in time range)  Tdap (BOOSTRIX) injection 0.5 mL (has no administration in time range)     Initial Impression / Assessment and Plan / ED Course  I have reviewed the triage vital signs and the nursing notes.  Pertinent labs & imaging results that were available during my care of the patient were reviewed by me and considered in my medical decision making (see chart for  details).    84 year old female with unwitnessed fall, skin tear which was repaired by me.  I personally reviewed the patient's right hand x-ray which shows no acute abnormalities.  She has no scaphoid tenderness.  Patient does have some abrasions toward the right eye and will need a CT scan of the head which is currently pending.  I have given disposal and sign out to nurse practitioner [facility] who will review images for appropriate discharge.  Final Clinical Impressions(s) / ED Diagnoses   Final diagnoses:  None    ED Discharge Orders    None       Margarita Mail, PA-C 07/20/18 [place], MD 07/20/18 2328

## 2018-07-20 NOTE — ED Provider Notes (Signed)
Level 5 caveat dementia history is obtained from patient's husband.  Patient fell while getting out of bed this morning.  She did not lose consciousness.  Presently denies pain anywhere.  She struck her face and fell face forward.  Presently patient acting his normal self.  On exam alert HEENT exam no facial asymmetry normocephalic atraumatic neck supple neurologic follows simple commands.  Cranial nerves II through XII grossly intact.  Moves all extremities well.  Gait steady.  Walks unassisted   Orlie Dakin, MD 07/20/18 1714

## 2018-07-20 NOTE — ED Triage Notes (Signed)
Pt fell out of bed this morning. Husband had to get EMS to come out and help get patient off floor. EMS wrapped up laceration on right hand. Pt went to PCP who states laceration was too deep and needed to go to ED. Pt also has abrasion to right side of face. Denies LOC or taking blood thinners.

## 2018-07-20 NOTE — Telephone Encounter (Signed)
Noted. Patient schedule for 10:45 am today.

## 2018-07-20 NOTE — Discharge Instructions (Addendum)
WOUND CARE Please have your stitches/staples removed in 10 days or sooner if you have concerns. You may do this at any available urgent care or at your primary care doctor's office.  Keep area clean and dry for 24 hours. Do not remove bandage, if applied.  After 24 hours, remove bandage and wash wound gently with mild soap and warm water. Reapply a new bandage after cleaning wound, if directed.  Continue daily cleansing with soap and water until stitches/staples are removed.  Do not apply any ointments or creams to the wound while stitches/staples are in place, as this may cause delayed healing.  Seek medical careif you experience any of the following signs of infection: Swelling, redness, pus drainage, streaking, fever >101.0 F  Seek care if you experience excessive bleeding that does not stop after 15-20 minutes of constant, firm pressure.  Call your neurologist in the morning and have them see you for follow up.

## 2018-07-20 NOTE — Telephone Encounter (Signed)
Pt.'s husband called to report pt. Kathryn [name] getting out of bed this morning. Hit her right hand and has a skin tear to top of hand. Small abrasion to right cheek.Husband called EMS to get pt. Up. EMS did "not think she needed to go to ED" per husband. Pt. Is up moving around without difficulty. Husband wants provider to check pt.'s hand. Reports it is swelling and bruising. Able to move fingers. EMS applied a dressing. Pt. Is not complaining of any pain. Appointment made for today.  Reason for Disposition . [1] High-risk adult (e.g., age > 64, osteoporosis, chronic steroid use) AND [2] still hurts  Answer Assessment - Initial Assessment Questions 1. MECHANISM: "How did the injury happen?"     Kathryn [name] getting out of bed this morning 2. ONSET: "When did the injury happen?" (Minutes or hours ago)      Pt. Fell 1 hour ago 3. APPEARANCE of INJURY: "What does the injury look like?"      Skin tear to right top of hand at the wrist 4. SEVERITY: "Can you use the hand normally?" "Can you bend your fingers into a ball and then fully open them?"     Can bend her fingers 5. SIZE: For cuts, bruises, or swelling, ask: "How large is it?" (e.g., inches or centimeters;  entire hand or wrist)      2-3 inches in size 6. PAIN: "Is there pain?" If so, ask: "How bad is the pain?"  (Scale 1-10; or mild, moderate, severe)     No pain 7. TETANUS: For any breaks in the skin, ask: "When was the last tetanus booster?"     Unsure 8. OTHER SYMPTOMS: "Do you have any other symptoms?"      Abrasion to cheek 9. PREGNANCY: "Is there any chance you are pregnant?" "When was your last menstrual period?"     No  Protocols used: HAND AND WRIST INJURY-A-AH

## 2018-07-20 NOTE — Progress Notes (Signed)
ACUTE VISIT   HPI:  Chief Complaint  Patient presents with  . Right hand injury    skin tear, had a fall this morning    [name] is a 84 y.o. female with history of dementia and unstable gait, who is here today with her husband complaining of fall at home today around 7 AM. She does not remember how event happened, so has been provides history.  She fell when she was getting up from bed, her husband thinks she may have had some lightheadedness, he does not think she tripped with furniture. According to husband she has not had more confusion than usual. No changes in appetite or physical activities. Negative for urinary symptoms and no changes in bowel movements.   Negative for head trauma or LOC. Her husband was not able to get her up from the floor, so he called EMS. Wound in right hand was dressed by EMS and recommended to follow-up with PCP.  Her husband has not noted MS changes and she is denying having any pain.  Hx of unstable gait,she is doing PT,she is not using assistance.   ABI done recently Summary:  Right: Resting right ankle-brachial index is within normal range. No evidence of significant right lower extremity arterial disease. The right toe-brachial index is abnormal.      Left: Resting left ankle-brachial index is within normal range. No evidence of significant left lower extremity arterial disease. The left toe-brachial index is abnormal.    She has not c/o leg pain or claudication like symptoms. No foot ulcers.   Review of Systems  Constitutional: Negative for activity change, appetite change and fever.  Eyes: Negative for redness and visual disturbance.  Respiratory: Negative for shortness of breath and wheezing.   Gastrointestinal: Negative for nausea and vomiting.  Genitourinary: Negative for decreased urine volume and dysuria.  Musculoskeletal: Positive for gait problem.  Skin: Positive for wound.  Neurological: Negative  for headaches.  Hematological: Bruises/bleeds easily.  Psychiatric/Behavioral: Negative for confusion.      Current Outpatient Medications on File Prior to Visit  Medication Sig Dispense Refill  . aspirin EC 81 MG tablet Take 1 tablet (81 mg total) by mouth daily. 30 tablet 11  . Cholecalciferol (VITAMIN D3) 2000 UNITS TABS Take 3,000 Units by mouth daily.     . Coenzyme Q10 (COQ10 PO) Take by mouth.    . folic acid (FOLVITE) 270 MCG tablet Take 400 mcg by mouth daily.    Marland Kitchen gabapentin (NEURONTIN) 100 MG capsule Take 1 capsule (100 mg total) by mouth 3 (three) times daily as needed. 270 capsule 6  . Grape Seed Extract 100 MG CAPS Take by mouth.    . mupirocin ointment (BACTROBAN) 2 % APPLY TO AFFECTED AREA TWICE A DAY FOR 10 DAYS  0  . NON FORMULARY MK-7 90 meq    . Omega-3 Fatty Acids (FISH OIL) 1000 MG CAPS Take by mouth.    . Potassium Gluconate 550 MG TABS Take by mouth.    . pyridoxine (B-6) 100 MG tablet Take 100 mg by mouth daily.    . rivastigmine (EXELON) 6 MG capsule TAKE 1 CAPSULE (6 MG TOTAL) 2 (TWO) TIMES DAILY BY MOUTH 60 capsule 11  . zinc gluconate 50 MG tablet Take 50 mg by mouth daily.     No current facility-administered medications on file prior to visit.      Past Medical History:  Diagnosis Date  . Alzheimer's dementia ([place])   .  Anxiety   . Headache   . Hypertension    Allergies  Allergen Reactions  . Aricept [Donepezil Hcl] Other (See Comments)    "giving stomach and digestive problems"  . Sulfonamide Derivatives     REACTION: Swollen joints    Social History   Socioeconomic History  . Marital status: Married    Spouse name: [name]   . Number of children: 3  . Years of education: 67  . Highest education level: Not on file  Occupational History  . Not on file  Social Needs  . Financial resource strain: Not on file  . Food insecurity:    Worry: Not on file    Inability: Not on file  . Transportation needs:    Medical: Not on file     Non-medical: Not on file  Tobacco Use  . Smoking status: Former Smoker    Last attempt to quit: 1980    Years since quitting: 39.9  . Smokeless tobacco: Never Used  Substance and Sexual Activity  . Alcohol use: Yes    Alcohol/week: 14.0 standard drinks    Types: 14 Glasses of wine per week    Comment: white wine  . Drug use: No  . Sexual activity: Not on file  Lifestyle  . Physical activity:    Days per week: Not on file    Minutes per session: Not on file  . Stress: Not on file  Relationships  . Social connections:    Talks on phone: Not on file    Gets together: Not on file    Attends religious service: Not on file    Active member of club or organization: Not on file    Attends meetings of clubs or organizations: Not on file    Relationship status: Not on file  Other Topics Concern  . Not on file  Social History Narrative   Lives at home with husband   Caffeine use: Drinks coffee/tea    Right handed   Goes to [employer] memory day care for 4 hours twice weekly    Vitals:   07/20/18 1031  BP: 130/83  Pulse: 79  Resp: 16  Temp: 97.6 F (36.4 C)  SpO2: 96%   Body mass index is 22.91 kg/m.   Physical Exam  Nursing note and vitals reviewed. Constitutional: She appears well-developed. No distress.  HENT:  Head: Normocephalic and atraumatic.  Mouth/Throat: Oropharynx is clear and moist and mucous membranes are normal.  Eyes: Conjunctivae are normal.  Cardiovascular: Normal rate and regular rhythm.  No murmur heard. Pulses:      Radial pulses are 2+ on the right side.  Respiratory: Effort normal and breath sounds normal. No respiratory distress.  Musculoskeletal:        General: No edema.     Right hand: She exhibits decreased range of motion (Baseline, Hx of OA) and laceration. She exhibits no tenderness and normal capillary refill.       Hands:  Lymphadenopathy:    She has no cervical adenopathy.  Neurological: She is alert.  Mildly unstable gait, she  has no assistance.  Skin: Skin is warm. Ecchymosis and laceration noted.  Hematoma noted on dorsum of right hand. Deep wound on dorsum right hand,radial aspect.    Psychiatric: She has a normal mood and affect.  Well groomed, good eye contact.      ASSESSMENT AND PLAN:  Ms. [name] was seen today for right hand injury.  Diagnoses and all orders for this visit:  Fall in home, initial encounter Fall precautions discussed. PT to continue. Recommend walker,husband doe snot think it is necessary at this time.   Hand, open wound except fingers, complicated, right, initial encounter I could not expose the whole wound because dressing stuck on skin flap, not able to remove completely even after wetting gauze. Wound seems to be deep,so I sent her to the ER. She is also going to need X ray done.  PAD (peripheral artery disease) (HCC) We discussed ABI. She is on Aspirin now,side effects discussed. Statin side effects discussed. Her husband prefers to wait on adding medications for now. Good skin care and avoidance of LE trauma recommended.  .     Return if symptoms worsen or fail to improve.         [name]. [name], MD  [facility]. [place] office.

## 2018-07-20 NOTE — Patient Instructions (Signed)
Sent to the ER, W-L, due to deep wound.

## 2018-07-20 NOTE — ED Provider Notes (Signed)
Kathryn [name] is a 84 y.o. here today s/p fall. Patient with CT results pending. Patient has been under the care of A. Kenton Kingfisher, [place] and Dr. Winfred Leeds.  BP 130/78 (BP Location: Left Arm)   Pulse 66   Temp (!) 97.4 F (36.3 C) (Oral)   Resp 16   SpO2 100%   Ct Head Wo Contrast  Result Date: 07/20/2018 CLINICAL DATA:  Patient fell out of bed this morning a abrasion to right side of face. EXAM: CT HEAD WITHOUT CONTRAST CT CERVICAL SPINE WITHOUT CONTRAST TECHNIQUE: Multidetector CT imaging of the head and cervical spine was performed following the standard protocol without intravenous contrast. Multiplanar CT image reconstructions of the cervical spine were also generated. COMPARISON:  07/10/2016 head CT FINDINGS: CT HEAD FINDINGS Brain: Sulcal and ventricular prominence consistent with superficial and central atrophy. Mild-to-moderate small vessel ischemia of periventricular white matter. No acute intracranial hemorrhage, midline shift or edema. No extra-axial fluid collection. Midline fourth ventricle and basal cisterns without effacement. No intra-axial mass or mass effect. Vascular: Atherosclerosis of the carotid siphons without hyperdense vessel sign. Skull: Negative for skull fracture or focal osseous lesions. Sinuses/Orbits: Intact without acute abnormality the orbits and globes. Clear paranasal sinuses. Clear mastoids. Other: No significant calvarial soft tissue swelling. CT CERVICAL SPINE FINDINGS Alignment: Reversal of cervical lordosis possibly from patient positioning or muscle spasm. Intact craniocervical relationship. Intact atlantodental interval. Skull base and vertebrae: No acute fracture. No primary bone lesion or focal pathologic process. Bilateral uncovertebral joint osteoarthritis with uncinate spurring from C4 through C7 with facet arthropathy. Soft tissues and spinal canal: No prevertebral fluid or swelling. No visible canal hematoma. Disc levels: C2-C3: Small central disc herniation  impressing on the ventral aspect of the thecal sac. No significant foraminal encroachment. C3-C4: Moderate central disc herniation impressing on the ventral aspect of thecal sac measuring 10 mm transverse by 4 mm AP. Facet hypertrophy with moderate bilateral foraminal encroachment. Minimal grade 1 anterolisthesis of C3 on C4. C4-C5: Moderate disc flattening with disc-osteophyte complex. Uncinate spurring contribute to mild bilateral foraminal encroachment. C5-C6: Marked disc flattening with disc-osteophyte complex. Mild bilateral foraminal encroachment from uncinate spurring. C6-C7: Moderate to marked disc flattening with disc-osteophyte complex. No significant central canal nor foraminal encroachment. C7-T1:  Negative Upper chest: Moderate atherosclerosis of the included aortic arch. Pulmonary consolidation or dominant mass. Extracranial carotid arteriosclerosis is noted bilaterally. Other: None IMPRESSION: 1. Atrophy with chronic small vessel ischemia. No acute intracranial abnormality. 2. Cervical spondylosis without acute posttraumatic cervical spine fracture or subluxation. 3. Moderate central disc herniation at C3-4 impressing on the ventral aspect of the thecal sac and causing mild-to-moderate central canal stenosis. Smaller central disc herniation at C2-3. Disc-osteophyte complexes noted from C4 through C7. Electronically Signed   By: Ashley Royalty M.D.   On: 07/20/2018 17:57   Ct Cervical Spine Wo Contrast  Result Date: 07/20/2018 CLINICAL DATA:  Patient fell out of bed this morning a abrasion to right side of face. EXAM: CT HEAD WITHOUT CONTRAST CT CERVICAL SPINE WITHOUT CONTRAST TECHNIQUE: Multidetector CT imaging of the head and cervical spine was performed following the standard protocol without intravenous contrast. Multiplanar CT image reconstructions of the cervical spine were also generated. COMPARISON:  07/10/2016 head CT FINDINGS: CT HEAD FINDINGS Brain: Sulcal and ventricular prominence  consistent with superficial and central atrophy. Mild-to-moderate small vessel ischemia of periventricular white matter. No acute intracranial hemorrhage, midline shift or edema. No extra-axial fluid collection. Midline fourth ventricle and basal cisterns without effacement. No  intra-axial mass or mass effect. Vascular: Atherosclerosis of the carotid siphons without hyperdense vessel sign. Skull: Negative for skull fracture or focal osseous lesions. Sinuses/Orbits: Intact without acute abnormality the orbits and globes. Clear paranasal sinuses. Clear mastoids. Other: No significant calvarial soft tissue swelling. CT CERVICAL SPINE FINDINGS Alignment: Reversal of cervical lordosis possibly from patient positioning or muscle spasm. Intact craniocervical relationship. Intact atlantodental interval. Skull base and vertebrae: No acute fracture. No primary bone lesion or focal pathologic process. Bilateral uncovertebral joint osteoarthritis with uncinate spurring from C4 through C7 with facet arthropathy. Soft tissues and spinal canal: No prevertebral fluid or swelling. No visible canal hematoma. Disc levels: C2-C3: Small central disc herniation impressing on the ventral aspect of the thecal sac. No significant foraminal encroachment. C3-C4: Moderate central disc herniation impressing on the ventral aspect of thecal sac measuring 10 mm transverse by 4 mm AP. Facet hypertrophy with moderate bilateral foraminal encroachment. Minimal grade 1 anterolisthesis of C3 on C4. C4-C5: Moderate disc flattening with disc-osteophyte complex. Uncinate spurring contribute to mild bilateral foraminal encroachment. C5-C6: Marked disc flattening with disc-osteophyte complex. Mild bilateral foraminal encroachment from uncinate spurring. C6-C7: Moderate to marked disc flattening with disc-osteophyte complex. No significant central canal nor foraminal encroachment. C7-T1:  Negative Upper chest: Moderate atherosclerosis of the included aortic  arch. Pulmonary consolidation or dominant mass. Extracranial carotid arteriosclerosis is noted bilaterally. Other: None IMPRESSION: 1. Atrophy with chronic small vessel ischemia. No acute intracranial abnormality. 2. Cervical spondylosis without acute posttraumatic cervical spine fracture or subluxation. 3. Moderate central disc herniation at C3-4 impressing on the ventral aspect of the thecal sac and causing mild-to-moderate central canal stenosis. Smaller central disc herniation at C2-3. Disc-osteophyte complexes noted from C4 through C7. Electronically Signed   By: Ashley Royalty M.D.   On: 07/20/2018 17:57   Dg Hand Complete Right  Result Date: 07/20/2018 CLINICAL DATA:  [place] from bed today with laceration of the hand. EXAM: RIGHT HAND - COMPLETE 3+ VIEW COMPARISON:  None. FINDINGS: Advanced arthritis of the inter phalangeal joints. Osteoarthritis of the first carpometacarpal joint. No evidence of acute fracture or dislocation. No sign of radiopaque foreign object. IMPRESSION: No acute or traumatic finding. 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 Electronically Signed   By: Nelson Chimes M.D.   On: 07/20/2018 12:33   Dr. Winfred Leeds reviewed results and neuro consult. Patient's husband states that the patient has a neurologist and he will call tomorrow and have the patient followed up in the next 2 days. They will return here for any problems.    Debroah Baller [place], [place] 07/20/18 [place], MD 07/20/18 2328

## 2018-07-22 ENCOUNTER — Encounter: Payer: Self-pay | Admitting: Neurology

## 2018-07-22 ENCOUNTER — Ambulatory Visit (INDEPENDENT_AMBULATORY_CARE_PROVIDER_SITE_OTHER): Payer: PPO | Admitting: Neurology

## 2018-07-22 VITALS — BP 140/68 | HR 68 | Ht 62.0 in | Wt 126.0 lb

## 2018-07-22 DIAGNOSIS — R2681 Unsteadiness on feet: Secondary | ICD-10-CM | POA: Diagnosis not present

## 2018-07-22 DIAGNOSIS — F0280 Dementia in other diseases classified elsewhere without behavioral disturbance: Secondary | ICD-10-CM | POA: Diagnosis not present

## 2018-07-22 DIAGNOSIS — W19XXXA Unspecified fall, initial encounter: Secondary | ICD-10-CM

## 2018-07-22 DIAGNOSIS — G3109 Other frontotemporal dementia: Secondary | ICD-10-CM

## 2018-07-22 NOTE — Patient Instructions (Signed)
Fall Prevention in the Home, Adult  Falls can cause injuries. They can happen to people of all ages. There are many things you can do to make your home safe and to help prevent falls. Ask for help when making these changes, if needed.  What actions can I take to prevent falls?  General Instructions  · Use good lighting in all rooms. Replace any light bulbs that burn out.  · Turn on the lights when you go into a dark area. Use night-lights.  · Keep items that you use often in easy-to-reach places. Lower the shelves around your home if necessary.  · Set up your furniture so you have a clear path. Avoid moving your furniture around.  · Do not have throw rugs and other things on the floor that can make you trip.  · Avoid walking on wet floors.  · If any of your floors are uneven, fix them.  · Add color or contrast paint or tape to clearly mark and help you see:  ? Any grab bars or handrails.  ? First and last steps of stairways.  ? Where the edge of each step is.  · If you use a stepladder:  ? Make sure that it is fully opened. Do not climb a closed stepladder.  ? Make sure that both sides of the stepladder are locked into place.  ? Ask someone to hold the stepladder for you while you use it.  · If there are any pets around you, be aware of where they are.  What can I do in the bathroom?         · Keep the floor dry. Clean up any water that spills onto the floor as soon as it happens.  · Remove soap buildup in the tub or shower regularly.  · Use non-skid mats or decals on the floor of the tub or shower.  · Attach bath mats securely with double-sided, non-slip rug tape.  · If you need to sit down in the shower, use a plastic, non-slip stool.  · Install grab bars by the toilet and in the tub and shower. Do not use towel bars as grab bars.  What can I do in the bedroom?  · Make sure that you have a light by your bed that is easy to reach.  · Do not use any sheets or blankets that are too big for your bed. They should  not hang down onto the floor.  · Have a firm chair that has side arms. You can use this for support while you get dressed.  What can I do in the kitchen?  · Clean up any spills right away.  · If you need to reach something above you, use a strong step stool that has a grab bar.  · Keep electrical cords out of the way.  · Do not use floor polish or wax that makes floors slippery. If you must use wax, use non-skid floor wax.  What can I do with my stairs?  · Do not leave any items on the stairs.  · Make sure that you have a light switch at the top of the stairs and the bottom of the stairs. If you do not have them, ask someone to add them for you.  · Make sure that there are handrails on both sides of the stairs, and use them. Fix handrails that are broken or loose. Make sure that handrails are as long as the stairways.  ·   Install non-slip stair treads on all stairs in your home.  · Avoid having throw rugs at the top or bottom of the stairs. If you do have throw rugs, attach them to the floor with carpet tape.  · Choose a carpet that does not hide the edge of the steps on the stairway.  · Check any carpeting to make sure that it is firmly attached to the stairs. Fix any carpet that is loose or worn.  What can I do on the outside of my home?  · Use bright outdoor lighting.  · Regularly fix the edges of walkways and driveways and fix any cracks.  · Remove anything that might make you trip as you walk through a door, such as a raised step or threshold.  · Trim any bushes or trees on the path to your home.  · Regularly check to see if handrails are loose or broken. Make sure that both sides of any steps have handrails.  · Install guardrails along the edges of any raised decks and porches.  · Clear walking paths of anything that might make someone trip, such as tools or rocks.  · Have any leaves, snow, or ice cleared regularly.  · Use sand or salt on walking paths during winter.  · Clean up any spills in your garage right  away. This includes grease or oil spills.  What other actions can I take?  · Wear shoes that:  ? Have a low heel. Do not wear high heels.  ? Have rubber bottoms.  ? Are comfortable and fit you well.  ? Are closed at the toe. Do not wear open-toe sandals.  · Use tools that help you move around (mobility aids) if they are needed. These include:  ? Canes.  ? Walkers.  ? Scooters.  ? Crutches.  · Review your medicines with your doctor. Some medicines can make you feel dizzy. This can increase your chance of falling.  Ask your doctor what other things you can do to help prevent falls.  Where to find more information  · Centers for Disease Control and Prevention, [employer]: https://cdc.gov  · National Institute on Aging: https://go4life.nia.nih.gov  Contact a doctor if:  · You are afraid of falling at home.  · You feel weak, drowsy, or dizzy at home.  · You fall at home.  Summary  · There are many simple things that you can do to make your home safe and to help prevent falls.  · Ways to make your home safe include removing tripping hazards and installing grab bars in the bathroom.  · Ask for help when making these changes in your home.  This information is not intended to replace advice given to you by your health care provider. Make sure you discuss any questions you have with your health care provider.  Document Released: 05/17/2009 Document Revised: 03/05/2017 Document Reviewed: 03/05/2017  Elsevier Interactive Patient Education © 2019 [employer]

## 2018-07-22 NOTE — Progress Notes (Signed)
[facility]    Provider:  Dr [name] Referring Provider: [name], [name], MD Primary Care Physician:  [name], [name], MD  CC: Memory changes and headaches  HPI: This is a very lovely 84year old female with memory loss. MoCA 14/30, MMSE 14/30. Likely Alzheimer's type dementia. MRi of the brain showed moderate generalized cortical atrophy that is most pronounced in the mesial temporal lobes and several T2/FLAIR hyperintense foci in the deep and subcortical white matter consistent with chronic microvascular ischemic changes.  Interval history 07/22/2018: She was in bed. She was waking up, husband thinks she was trying to put her slippers on. She hit the floor. She has never fallen before. She still has injury aorund her eye, healing ecchymoses. No other significant falls, she had a restless night no sleeping, agitated up and down. Husband has been watching her fluif intake thinks maybe she was dehydrated. She naps and twitches. She badly bruised her hand. She is feeling better.  They have had a home inspection this year for safety. No vision loss, no headaches.    Interval history 06/15/2018: Patient here with husband for follow up. She says she is going "great" and is pleasant. She has improved with speech with speech therapy and is going to [facility] twice a week for adult daycenter. They see her pcp and had difficulty with stomach issue which resolved.  Feels like she gained weight. She is not eating more. She is getting less exercise. Could be a medication, hard to know without stopping. Her mood is fine, will discontinue the Lexapro  Interval history 04/02/2017: Patient is here with husband for follow-up. Husband is asking my opinion on whether patient has capacity to make decisions regarding estate planning. She has difficulty due to dementia expressing but she has a basic understanding when trying to explain POA and beneficiaries - But I do think she understands when  asked and explained she grasps the concepts and is able to agree or decline. If you ask her, she is able to express her wishes for inheritance, POA and other legal issues. She is very clear on her thoughts on inheritance and family members she would like to be included and how her estate should be dispersed. She also is also in agreement with her husband and has no reservations about his decision making and his concern for her well-being.  Interval history 09/04/2016: Patient continues to have headaches. CT of the head was negative for acute intracranial abnormality. She has daily headaches. She was taking up to 7 tylenol daily and I advised him to stop as this can cause significant side effects including rebound headaches. No falls or head injury. She has a difficult time describing the headaches due to dementia. We stopped Aricept to see if this would help but it has not made any changes. They were using the tylenol and they stopped the last 2 days. The last 2 days she has not taken tylenol and she has not mentioned the headache. No double vision or neck pain. CRP and sed rate were normal. I have advised no OTC medications more than 2-3x a week to avoid rebound headaches. No previous history of migraines. Headache is in the temple areas. No vision changes or other signs of temporal arteritis. They are going to see the eye doctor. She also stopped all supplements and they are slowly adding them back. I advised we don;t exactly know what is in all these supplements and I advise them to stop if there are  headaches. She has been better these last 2 days and I advised if it continues/worsens we can order an MRI brain. Headaches are with activity. A home nurse took blood pressure about 140. She has fallen twice she sits in a chair and she got up and turned too fast and fell on the floor. We could also try low dose neurontin. I recommended PT in the home for gait and safety and a home safety study and they declined, I  advised safety precautions for fall risks. Husband thinks stress may be causing the headache due to her brother's health problems and recent illness, her son has also been in the hospital twice in the last 3 months for alcoholism. Husband takes notes today.   Interval history: 03/03/2016: Here for follow up of alzheimer's dementia. Repeat moCA 12/30 (last 14/30). She is on Aricept, did not tolerate the Namenda. B12 normal. Last TSH WNL. Here with husband. She does chair yoga. Her blood pressures at home have been normal avg 133/70, elevation in the officelikely from anxiety. She is sleeping well. There is some stress with patient's daughter which is better. They have sons with substance abuse and this has improved as well. She still sees her high school friends from [place] her HS at [employer] 4x a year, no behavioral prolems, she is lovely.   Interval history 09/04/2015: She is feeling better. She is having more bowel movements with the namenda. No diarrhea. It is not causing her discomfort, she is not dehydrated, she doesn't drink a lot of coffee, she has a great diet full of fiber. Anxiety level is better. She has projects to do at home, she is remaining active daily. She is here with her husband.   Reviewed MRi of the brain: IMPRESSION: This is an abnormal MRI of the brain without contrast showed the following: 1. Moderate generalized cortical atrophy that is most pronounced in the mesial temporal lobes. 2. Several T2/FLAIR hyperintense foci in the deep and subcortical white matter consistent with chronic microvascular ischemic changes. The extent is typical for age.  B12 and folate normal.   HPI: Kathryn [name] is a 84 y.o. female here as a referral from Dr. Justin Mend for memory loss. PMHx anxiety, HTN, fatige, headache. She lives with her husband in a single-family home. Husband is here with her. She functions well at home per patient, she cooks and likes to do yard work. She watches the  news dails and enjoys watching movies. Patient says she forgets things at the store. Husband noticed the memory changes which started last October/November when they were dealing with bed bugs. Husband says directions were difficult for patient, he had to watch patient closely to make sure she followed directions. Previous to this the changes were subtle, she would forget little things. There has been a decline in memory since then. Slowly progressive. She cooks, cleans, she drives. One time the stove was left on but otherwise no accidents in the home or in the car. She doesn't drive far, she doesn't get lost. She knows where to go around her house. She can make dinner fine. One time she forgot to add an ingredient in dinner but that was just an oversight. She is sleeping well. She writes down appointments and keep appointment cards. She has forgotten one appointment a long time ago. Husband does the finances, patient used to do the finances but husband took over because patient couldn't keep up with it. She was forgetting to pay bills. No behavioral changes  except sometimes she gets aggravated at herself. No hallucinations or delusions. Mother with Alzheimers at about age 34. Husband is becoming concerned with wife's driving. She goes to yoga every week. No other focal neurologic symptoms. Older memories are intact. More recent memories impaired.   Reviewed notes, labs and imaging from outside physicians, which showed: patient was referred for evaluation of alzheimer's dementia. CMP unremarkable. TSH wnl.   Review of Systems: Patient complains of symptoms per HPI as well as the following symptoms: No fever, no chills, no CP, no sob. Pertinent negatives per HPI. All others negative.   Review of Systems: Patient complains of symptoms per HPI as well as the following symptoms: no agitation. Pertinent negatives and positives per HPI. All others negative.   Social History   Socioeconomic History  .  Marital status: Married    Spouse name: [name]   . Number of children: 3  . Years of education: 35  . Highest education level: Not on file  Occupational History  . Not on file  Social Needs  . Financial resource strain: Not on file  . Food insecurity:    Worry: Not on file    Inability: Not on file  . Transportation needs:    Medical: Not on file    Non-medical: Not on file  Tobacco Use  . Smoking status: Former Smoker    Last attempt to quit: 1980    Years since quitting: 40.0  . Smokeless tobacco: Never Used  Substance and Sexual Activity  . Alcohol use: Yes    Alcohol/week: 7.0 - 14.0 standard drinks    Types: 7 - 14 Glasses of wine per week    Comment: white wine  . Drug use: No  . Sexual activity: Not on file  Lifestyle  . Physical activity:    Days per week: Not on file    Minutes per session: Not on file  . Stress: Not on file  Relationships  . Social connections:    Talks on phone: Not on file    Gets together: Not on file    Attends religious service: Not on file    Active member of club or organization: Not on file    Attends meetings of clubs or organizations: Not on file    Relationship status: Not on file  . Intimate partner violence:    Fear of current or ex partner: Not on file    Emotionally abused: Not on file    Physically abused: Not on file    Forced sexual activity: Not on file  Other Topics Concern  . Not on file  Social History Narrative   Lives at home with husband   Caffeine use: Drinks coffee/tea    Right handed   Goes to [address] [facility] day care for 4 hours twice weekly    Family History  Problem Relation Age of Onset  . Thyroid disease Mother   . Alzheimer's disease Mother   . Dementia Mother   . Hearing loss Brother   . Hearing loss Brother        also developing memory problems  . Heart Problems Brother     Past Medical History:  Diagnosis Date  . Alzheimer's dementia ([place])   . Anxiety   . Headache   .  Hypertension     Past Surgical History:  Procedure Laterality Date  . BREAST BIOPSY      Current Outpatient Medications  Medication Sig Dispense Refill  . aspirin EC 81  MG tablet Take 1 tablet (81 mg total) by mouth daily. 30 tablet 11  . Cholecalciferol (VITAMIN D3) 2000 UNITS TABS Take 3,000 Units by mouth daily.     . Coenzyme Q10 (COQ10 PO) Take by mouth.    . folic acid (FOLVITE) 540 MCG tablet Take 400 mcg by mouth daily.    Marland Kitchen gabapentin (NEURONTIN) 100 MG capsule Take 1 capsule (100 mg total) by mouth 3 (three) times daily as needed. 270 capsule 6  . Grape Seed Extract 100 MG CAPS Take by mouth.    . mupirocin ointment (BACTROBAN) 2 % APPLY TO AFFECTED AREA TWICE A DAY FOR 10 DAYS  0  . NON FORMULARY MK-7 90 meq    . Omega-3 Fatty Acids (FISH OIL) 1000 MG CAPS Take by mouth.    . Potassium Gluconate 550 MG TABS Take by mouth.    . pyridoxine (B-6) 100 MG tablet Take 100 mg by mouth daily.    . rivastigmine (EXELON) 6 MG capsule TAKE 1 CAPSULE (6 MG TOTAL) 2 (TWO) TIMES DAILY BY MOUTH 60 capsule 11  . zinc gluconate 50 MG tablet Take 50 mg by mouth daily.     No current facility-administered medications for this visit.     Allergies as of 07/22/2018 - Review Complete 07/22/2018  Allergen Reaction Noted  . Aricept [donepezil hcl] Other (See Comments) 09/04/2016  . Sulfonamide derivatives  09/23/2010    Vitals: BP 140/68 (BP Location: Left Arm, Patient Position: Sitting)   Pulse 68   Ht 5\' 2"  (1.575 m)   Wt 126 lb (57.2 kg)   BMI 23.05 kg/m  Last Weight:  Wt Readings from Last 1 Encounters:  07/22/18 126 lb (57.2 kg)   Last Height:   Ht Readings from Last 1 Encounters:  07/22/18 5\' 2"  (1.575 m)         Cranial Nerves:  The pupils are equal, round, and reactive to light. Visual fields are full to finger confrontation. Extraocular movements are intact. Trigeminal sensation is intact and the muscles of mastication are normal. The face is symmetric. The  palate elevates in the midline. Hearing intact. Voice is normal. Shoulder shrug is normal. The tongue has normal motion without fasciculations.   Motor Observation:  No asymmetry, no atrophy, and no involuntary movements noted. Tone:  Normal muscle tone.   Posture:  Posture is normal. normal erect   Strength:  Strength is V/V in the upper and lower limbs.    Sensation: intact to LT   Reflex Exam:  DTR's:  Deep tendon reflexes in the upper and lower extremities are normal bilaterally.  Toes: left upgoing Clonus:  Clonus is absent.   MMSE - Mini Mental State Exam 09/14/2017 12/01/2016  Orientation to time 1 1  Orientation to Place 2 3  Registration 3 3  Attention/ Calculation 0 0  Recall 0 0  Language- name 2 objects 1 2  Language- repeat 0 1  Language- follow 3 step command 3 3  Language- read & follow direction 1 1  Write a sentence 0 0  Copy design 0 0  Total score 11 14     Assessment/Plan: This is a very lovely 84year old female with memory loss. MoCA 14/30, MMSE 14/30. Likely Alzheimer's type dementia. MRi of the brain showed moderate generalized cortical atrophy that is most pronounced in the mesial temporal lobes and several T2/FLAIR hyperintense foci in the deep and subcortical white matter consistent with chronic microvascular ischemic changes. The extent is typical  for age. B12, folate and TSH WNL. She has several months of headaches in the setting of stress, CT of the head negative.  Continue Exeleon (did not tolerate Aricept) Did not tolerate the Namenda 10mg  twice daily.  No Driving Stop Lexapro, anxiety improved Headache improved - gabapentin as needed Memory stable Aphasia improved with therapy and adult daycenter HLD: Recommend a daily baby asa for stroke prevention. Also LDL is elevated, discuss with pcp about a statin for goal < 70. Stroke prevention: daily asa Fall risk, discussed, recent mechanical fall, they  decline PT, already had house safety evauated  Prior appointment: - Patient is here with husband for follow-up. Husband is asking my opinion on whether patient has capacity to make decisions regarding estate planning. She has difficulty due to dementia but she has a basic understanding when trying to explain POA and beneficiaries, I do think she  she grasps the concepts and is able to agree or decline with state planning decisions. She is able to express her wishes for inheritance, POA and other legal issues. She is very clear on her thoughts on inheritance and family members she would like to be included and how her estate should be dispersed. She also is also in agreement with her husband and has no reservations about his decision making and his concern for her well-being.   Discussed dementia, progressive disorder, RTC as needed  Sarina Ill, MD  [facility] [address] [place],  [address]  Phone [phone] Fax [phone]  A total of 25 minutes was spent face-to-face with this patient. Over half this time was spent on counseling patient on the  1. Unstable gait   2. Other frontotemporal dementia without behavioral disturbance ([place])   3. Fall, initial encounter     diagnosis and different diagnostic and therapeutic options available.

## 2018-08-02 ENCOUNTER — Ambulatory Visit (INDEPENDENT_AMBULATORY_CARE_PROVIDER_SITE_OTHER): Payer: PPO | Admitting: Family Medicine

## 2018-08-02 ENCOUNTER — Encounter: Payer: Self-pay | Admitting: Family Medicine

## 2018-08-02 VITALS — BP 116/74 | HR 74 | Temp 98.0°F | Resp 12 | Ht 62.0 in | Wt 125.2 lb

## 2018-08-02 DIAGNOSIS — S61411A Laceration without foreign body of right hand, initial encounter: Secondary | ICD-10-CM

## 2018-08-02 DIAGNOSIS — R2681 Unsteadiness on feet: Secondary | ICD-10-CM | POA: Diagnosis not present

## 2018-08-02 DIAGNOSIS — S61411D Laceration without foreign body of right hand, subsequent encounter: Secondary | ICD-10-CM

## 2018-08-02 NOTE — Progress Notes (Signed)
HPI:   [name] is a 84 y.o. female, who is here today with her husband to follow on recent OV and ER visit.  Husband provides Hx.  Since her last OV she has also followed with her neurologist, Dr Kathryn [name].   She was last here in the office on 07/20/18 for acute visit. She was referred to the ER because right hand wound. She has not had fall since her lat OV.  Denies having hand pain. Negative for erythema or pain.  Husband has not noted fever, decreased appetite,or MS changes.  In the ER she had right hand imaging, neck and head CT.  Right hand X ray: No acute or traumatic finding. 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   Cervical and head CT: 1. Atrophy with chronic small vessel ischemia. No acute intracranial abnormality. 2. Cervical spondylosis without acute posttraumatic cervical spine fracture or subluxation. 3. Moderate central disc herniation at C3-4 impressing on the ventral aspect of the thecal sac and causing mild-to-moderate central canal stenosis. Smaller central disc herniation at C2-3. Disc-osteophyte complexes noted from C4 through C7.   Review of Systems  Constitutional: Negative for appetite change, chills and fatigue.  Musculoskeletal: Positive for gait problem. Negative for neck pain.  Skin: Positive for wound. Negative for rash.  Neurological: Negative for syncope, weakness and headaches.  Hematological: Bruises/bleeds easily.  Psychiatric/Behavioral: Negative for confusion. The patient is nervous/anxious.       Current Outpatient Medications on File Prior to Visit  Medication Sig Dispense Refill  . aspirin EC 81 MG tablet Take 1 tablet (81 mg total) by mouth daily. 30 tablet 11  . Cholecalciferol (VITAMIN D3) 2000 UNITS TABS Take 3,000 Units by mouth daily.     . Coenzyme Q10 (COQ10 PO) Take by mouth.    . folic acid (FOLVITE) 539 MCG tablet Take 400 mcg by mouth daily.    Marland Kitchen gabapentin  (NEURONTIN) 100 MG capsule Take 1 capsule (100 mg total) by mouth 3 (three) times daily as needed. 270 capsule 6  . Grape Seed Extract 100 MG CAPS Take by mouth.    . mupirocin ointment (BACTROBAN) 2 % APPLY TO AFFECTED AREA TWICE A DAY FOR 10 DAYS  0  . NON FORMULARY MK-7 90 meq    . Omega-3 Fatty Acids (FISH OIL) 1000 MG CAPS Take by mouth.    . Potassium Gluconate 550 MG TABS Take by mouth.    . pyridoxine (B-6) 100 MG tablet Take 100 mg by mouth daily.    . rivastigmine (EXELON) 6 MG capsule TAKE 1 CAPSULE (6 MG TOTAL) 2 (TWO) TIMES DAILY BY MOUTH 60 capsule 11  . zinc gluconate 50 MG tablet Take 50 mg by mouth daily.     No current facility-administered medications on file prior to visit.      Past Medical History:  Diagnosis Date  . Alzheimer's dementia ([name])   . Anxiety   . Headache   . Hypertension    Allergies  Allergen Reactions  . Aricept [Donepezil Hcl] Other (See Comments)    "giving stomach and digestive problems"  . Sulfonamide Derivatives     REACTION: Swollen joints    Social History   Socioeconomic History  . Marital status: Married    Spouse name: Kathryn [name]   . Number of children: 3  . Years of education: 75  . Highest education level: Not on file  Occupational History  . Not on file  Social  Needs  . Financial resource strain: Not on file  . Food insecurity:    Worry: Not on file    Inability: Not on file  . Transportation needs:    Medical: Not on file    Non-medical: Not on file  Tobacco Use  . Smoking status: Former Smoker    Last attempt to quit: 1980    Years since quitting: 40.0  . Smokeless tobacco: Never Used  Substance and Sexual Activity  . Alcohol use: Yes    Alcohol/week: 7.0 - 14.0 standard drinks    Types: 7 - 14 Glasses of wine per week    Comment: white wine  . Drug use: No  . Sexual activity: Not on file  Lifestyle  . Physical activity:    Days per week: Not on file    Minutes per session: Not on file  . Stress: Not on  file  Relationships  . Social connections:    Talks on phone: Not on file    Gets together: Not on file    Attends religious service: Not on file    Active member of club or organization: Not on file    Attends meetings of clubs or organizations: Not on file    Relationship status: Not on file  Other Topics Concern  . Not on file  Social History Narrative   Lives at home with husband   Caffeine use: Drinks coffee/tea    Right handed   Goes to [address] [facility] for 4 hours twice weekly    Vitals:   08/02/18 1620  BP: 116/74  Pulse: 74  Resp: 12  Temp: 98 F (36.7 C)  SpO2: 96%   Body mass index is 22.91 kg/m.    Physical Exam  Nursing note and vitals reviewed. Constitutional: She appears well-nourished.  HENT:  Head: Normocephalic and atraumatic.  Eyes: Conjunctivae are normal.  Cardiovascular: Normal rate and regular rhythm.  Respiratory: Effort normal. No respiratory distress.  Musculoskeletal:     Comments: There is not joint edema or erythema. Right wrist/IP joints ROM mildly limited but no pain is elicited.   Skin: Ecchymosis noted. No erythema.  Right hand with linear wound that extends from volar and radial aspect of wrist to dorsal aspect, About 6-7 cm with 5 interrupted stitches. Mild local edema and ecchymosis.  Psychiatric: Her mood appears anxious.  Well groomed, good eye contact.    ASSESSMENT AND PLAN:  Ms. Kathryn [name] was seen today for removal of stitches in right hand.  Diagnoses and all orders for this visit:  Skin tear of right hand without complication, subsequent encounter Wound is healing well. Stitches x 5 removed,she tolerated procedure well. Keep area clean with soap a water.  Unstable gait Fall precautions discussed. Her husband is not interested in PT for now,he would let me know if he changes up his mind.   15 min face to face OV. > 50% was dedicated to discussion of wound care,fall prevention,risk factirs for  falls,and some side effects of medications she is currently taking.     Kathryn Fotheringham G. [name], MD  [facility]. [place] office.

## 2018-08-23 ENCOUNTER — Ambulatory Visit (INDEPENDENT_AMBULATORY_CARE_PROVIDER_SITE_OTHER): Payer: PPO | Admitting: Family Medicine

## 2018-08-23 ENCOUNTER — Encounter: Payer: Self-pay | Admitting: Family Medicine

## 2018-08-23 VITALS — BP 120/68 | HR 74 | Temp 98.5°F | Resp 12 | Ht 62.0 in | Wt 127.1 lb

## 2018-08-23 DIAGNOSIS — I10 Essential (primary) hypertension: Secondary | ICD-10-CM | POA: Diagnosis not present

## 2018-08-23 DIAGNOSIS — R2681 Unsteadiness on feet: Secondary | ICD-10-CM

## 2018-08-23 DIAGNOSIS — G3109 Other frontotemporal dementia: Secondary | ICD-10-CM

## 2018-08-23 DIAGNOSIS — I739 Peripheral vascular disease, unspecified: Secondary | ICD-10-CM | POA: Diagnosis not present

## 2018-08-23 DIAGNOSIS — R3129 Other microscopic hematuria: Secondary | ICD-10-CM

## 2018-08-23 DIAGNOSIS — N183 Chronic kidney disease, stage 3 unspecified: Secondary | ICD-10-CM

## 2018-08-23 DIAGNOSIS — E7849 Other hyperlipidemia: Secondary | ICD-10-CM

## 2018-08-23 DIAGNOSIS — F0280 Dementia in other diseases classified elsewhere without behavioral disturbance: Secondary | ICD-10-CM | POA: Diagnosis not present

## 2018-08-23 MED ORDER — PRAVASTATIN SODIUM 10 MG PO TABS: 10.0000 mg | ORAL_TABLET | Freq: Every day | ORAL | 1 refills | Status: DC

## 2018-08-23 NOTE — Assessment & Plan Note (Signed)
She does not think cane or walker are needed. We discussed fall precautions.

## 2018-08-23 NOTE — Assessment & Plan Note (Signed)
Otherwise stable. She has benefit from adult daycare. Continue following with Dr. [name].

## 2018-08-23 NOTE — Assessment & Plan Note (Signed)
Problem has been stable.  Average e GFR mid 50's, Cr 1.0 Continue avoiding NSAIDs. Low salt diet. For now I am not recommending ACE inhibitors or ARB. We will check next OV.

## 2018-08-23 NOTE — Patient Instructions (Signed)
A few things to remember from today's visit:   Unstable gait  Other hyperlipidemia - Plan: pravastatin (PRAVACHOL) 10 MG tablet  PAD (peripheral artery disease) (HCC)  CKD (chronic kidney disease), stage III ([place])   Please be sure medication list is accurate. If a new problem present, please set up appointment sooner than planned today.

## 2018-08-23 NOTE — Assessment & Plan Note (Signed)
Problem has been well controlled with nonpharmacologic treatment. Continue low-salt diet.

## 2018-08-23 NOTE — Assessment & Plan Note (Signed)
Today she and her husband agree with trying low-dose statin medication. We discussed some side effects and benefits. No change in Aspirin 81 mg. Follow-up in 4 months.

## 2018-08-23 NOTE — Progress Notes (Signed)
HPI:   Kathryn [name] is a 85 y.o. female, who is here today for chronic problem management.  She was last seen on 08/02/2018 after ER visit due to right hand laceration after fall.  She has not had any falls since her last visit. Her husband removed factor that contributed to her last fall,she tangled with covers when getting off from bed. Removed long covers from bed and supervising closely to prevent her from tripping. She has unstable gait but she has not been agreeable to use a cane or a walker.  Wound on right hand has healed completely and she has no residual symptom.   HTN managed with nonpharmacologic treatment. She follows low-salt diet. She denies checking BP at home.   Lab Results  Component Value Date   CREATININE 1.14 06/29/2018   BUN 21 06/29/2018   NA 142 06/29/2018   K 3.8 06/29/2018   CL 104 06/29/2018   CO2 30 06/29/2018   CKD III: Urine microscopic done on 06/29/2017 showed RBC 3-6. Denies dysuria,increased urinary frequency, foam in urine,gross hematuria,or decreased urine output.   Hyperlipidemia. She has not decided about starting medication. She follows low-fat diet.  PAD, she is on Aspirin 81 mg daily. Denies claudication-like symptoms.  Lab Results  Component Value Date   CHOL 235 (H) 08/21/2017   HDL 75.50 08/21/2017   LDLCALC 129 (H) 08/21/2017   TRIG 152.0 (H) 08/21/2017   CHOLHDL 3 08/21/2017   Dementia: Currently she is on Exelon 6 mg twice daily. She has tolerated medication well. Last time she follow-up with Dr. [name] was on 07/22/2018. Her husband thinks probably has been stable.  Attending adult daycare twice per week, which she loves.    Review of Systems  Constitutional: Negative for activity change, appetite change, fatigue and fever.  HENT: Negative for mouth sores, nosebleeds and sore throat.   Eyes: Negative for redness and visual disturbance.  Respiratory: Negative for cough, shortness of breath and  wheezing.   Cardiovascular: Negative for chest pain, palpitations and leg swelling.  Gastrointestinal: Negative for abdominal pain, nausea and vomiting.       Negative for changes in bowel habits.  Genitourinary: Negative for decreased urine volume, dysuria and hematuria.  Musculoskeletal: Positive for gait problem. Negative for myalgias.  Neurological: Negative for syncope, weakness, numbness and headaches.  Psychiatric/Behavioral: Negative for confusion. The patient is nervous/anxious.     Current Outpatient Medications on File Prior to Visit  Medication Sig Dispense Refill  . aspirin EC 81 MG tablet Take 1 tablet (81 mg total) by mouth daily. 30 tablet 11  . Cholecalciferol (VITAMIN D3) 2000 UNITS TABS Take 3,000 Units by mouth daily.     . Coenzyme Q10 (COQ10 PO) Take by mouth.    . folic acid (FOLVITE) 921 MCG tablet Take 400 mcg by mouth daily.    Marland Kitchen gabapentin (NEURONTIN) 100 MG capsule Take 1 capsule (100 mg total) by mouth 3 (three) times daily as needed. 270 capsule 6  . Grape Seed Extract 100 MG CAPS Take by mouth.    . mupirocin ointment (BACTROBAN) 2 % APPLY TO AFFECTED AREA TWICE A DAY FOR 10 DAYS  0  . NON FORMULARY MK-7 90 meq    . Omega-3 Fatty Acids (FISH OIL) 1000 MG CAPS Take by mouth.    . Potassium Gluconate 550 MG TABS Take by mouth.    . pyridoxine (B-6) 100 MG tablet Take 100 mg by mouth daily.    Marland Kitchen  rivastigmine (EXELON) 6 MG capsule TAKE 1 CAPSULE (6 MG TOTAL) 2 (TWO) TIMES DAILY BY MOUTH 60 capsule 11  . zinc gluconate 50 MG tablet Take 50 mg by mouth daily.     No current facility-administered medications on file prior to visit.      Past Medical History:  Diagnosis Date  . Alzheimer's dementia ([place])   . Anxiety   . Headache   . Hypertension    Allergies  Allergen Reactions  . Aricept [Donepezil Hcl] Other (See Comments)    "giving stomach and digestive problems"  . Sulfonamide Derivatives     REACTION: Swollen joints    Social History    Socioeconomic History  . Marital status: Married    Spouse name: [name]   . Number of children: 3  . Years of education: 13  . Highest education level: Not on file  Occupational History  . Not on file  Social Needs  . Financial resource strain: Not on file  . Food insecurity:    Worry: Not on file    Inability: Not on file  . Transportation needs:    Medical: Not on file    Non-medical: Not on file  Tobacco Use  . Smoking status: Former Smoker    Last attempt to quit: 1980    Years since quitting: 40.0  . Smokeless tobacco: Never Used  Substance and Sexual Activity  . Alcohol use: Yes    Alcohol/week: 7.0 - 14.0 standard drinks    Types: 7 - 14 Glasses of wine per week    Comment: white wine  . Drug use: No  . Sexual activity: Not on file  Lifestyle  . Physical activity:    Days per week: Not on file    Minutes per session: Not on file  . Stress: Not on file  Relationships  . Social connections:    Talks on phone: Not on file    Gets together: Not on file    Attends religious service: Not on file    Active member of club or organization: Not on file    Attends meetings of clubs or organizations: Not on file    Relationship status: Not on file  Other Topics Concern  . Not on file  Social History Narrative   Lives at home with husband   Caffeine use: Drinks coffee/tea    Right handed   Goes to [address] [facility] day [facility] for 4 hours twice weekly    Vitals:   08/23/18 1410  BP: 120/68  Pulse: 74  Resp: 12  Temp: 98.5 F (36.9 C)  SpO2: 96%   Body mass index is 23.25 kg/m.   Physical Exam  Nursing note and vitals reviewed. Constitutional: She appears well-developed and well-nourished. No distress.  HENT:  Head: Normocephalic and atraumatic.  Mouth/Throat: Oropharynx is clear and moist and mucous membranes are normal.  Eyes: Pupils are equal, round, and reactive to light. Conjunctivae are normal.  Cardiovascular: Normal rate and regular rhythm.   No murmur heard. DP pulses present bilateral.  Respiratory: Effort normal and breath sounds normal. No respiratory distress.  GI: Soft. She exhibits no mass. There is no abdominal tenderness.  Musculoskeletal:        General: No edema.  Lymphadenopathy:    She has no cervical adenopathy.  Neurological: She is alert. She has normal strength. She is disoriented (X 1, time.). No cranial nerve deficit.  Oriented for place and person. Unstable gait,not assisted.  Skin: Skin is  warm. No rash noted. No erythema.  Psychiatric: She has a normal mood and affect.  Well groomed, good eye contact.     ASSESSMENT AND PLAN:   Kathryn [name] was seen today for chronic problem management follow-up.  No orders of the defined types were placed in this encounter.   Unstable gait She does not think cane or walker are needed. We discussed fall precautions.  PAD (peripheral artery disease) ([place]) Today she and her husband agree with trying low-dose statin medication. We discussed some side effects and benefits. No change in Aspirin 81 mg. Follow-up in 4 months.  HTN (hypertension) Problem has been well controlled with nonpharmacologic treatment. Continue low-salt diet.  HLD (hyperlipidemia) Continue low-fat diet. Pravastatin 10 mg daily started today. Follow-up in 4 months.  CKD (chronic kidney disease), stage III Problem has been stable.  Average e GFR mid 50's, Cr 1.0 Continue avoiding NSAIDs. Low salt diet. For now I am not recommending ACE inhibitors or ARB. We will check next OV.  Dementia Otherwise stable. She has benefit from adult daycare. Continue following with Dr. [name].   Microscopic hematuria Asymptomatic. We reviewed last microscopic urine test, mild hematuria. We will plan on checking UA next visit.     Return in about 5 months (around 01/22/2019) for 4-6 months HLD,CKD. Medicare needed..      G. [name], MD  [facility]. [place]  office.

## 2018-08-23 NOTE — Assessment & Plan Note (Signed)
Continue low-fat diet. Pravastatin 10 mg daily started today. Follow-up in 4 months.

## 2018-09-07 ENCOUNTER — Emergency Department (HOSPITAL_COMMUNITY)
Admission: EM | Admit: 2018-09-07 | Discharge: 2018-09-07 | Disposition: A | Discharge status: Home / Self Care | Payer: PPO | Attending: Emergency Medicine | Admitting: Emergency Medicine

## 2018-09-07 ENCOUNTER — Ambulatory Visit: Payer: Self-pay | Admitting: *Deleted

## 2018-09-07 ENCOUNTER — Other Ambulatory Visit: Payer: Self-pay

## 2018-09-07 DIAGNOSIS — N183 Chronic kidney disease, stage 3 (moderate): Secondary | ICD-10-CM | POA: Insufficient documentation

## 2018-09-07 DIAGNOSIS — Z87891 Personal history of nicotine dependence: Secondary | ICD-10-CM | POA: Diagnosis not present

## 2018-09-07 DIAGNOSIS — Z79899 Other long term (current) drug therapy: Secondary | ICD-10-CM | POA: Diagnosis not present

## 2018-09-07 DIAGNOSIS — G309 Alzheimer's disease, unspecified: Secondary | ICD-10-CM | POA: Insufficient documentation

## 2018-09-07 DIAGNOSIS — N3000 Acute cystitis without hematuria: Secondary | ICD-10-CM | POA: Insufficient documentation

## 2018-09-07 DIAGNOSIS — I129 Hypertensive chronic kidney disease with stage 1 through stage 4 chronic kidney disease, or unspecified chronic kidney disease: Secondary | ICD-10-CM | POA: Diagnosis not present

## 2018-09-07 DIAGNOSIS — R4182 Altered mental status, unspecified: Secondary | ICD-10-CM | POA: Diagnosis not present

## 2018-09-07 DIAGNOSIS — R410 Disorientation, unspecified: Secondary | ICD-10-CM | POA: Diagnosis not present

## 2018-09-07 DIAGNOSIS — F0280 Dementia in other diseases classified elsewhere without behavioral disturbance: Secondary | ICD-10-CM | POA: Insufficient documentation

## 2018-09-07 DIAGNOSIS — R404 Transient alteration of awareness: Secondary | ICD-10-CM | POA: Diagnosis not present

## 2018-09-07 DIAGNOSIS — Z7982 Long term (current) use of aspirin: Secondary | ICD-10-CM | POA: Diagnosis not present

## 2018-09-07 DIAGNOSIS — R300 Dysuria: Secondary | ICD-10-CM | POA: Diagnosis not present

## 2018-09-07 LAB — CBC WITH DIFFERENTIAL/PLATELET
Abs Immature Granulocytes: 0.02 10*3/uL (ref 0.00–0.07)
Basophils Absolute: 0 10*3/uL (ref 0.0–0.1)
Basophils Relative: 1 %
Eosinophils Absolute: 0.4 10*3/uL (ref 0.0–0.5)
Eosinophils Relative: 6 %
HCT: 39.9 % (ref 36.0–46.0)
Hemoglobin: 12.6 g/dL (ref 12.0–15.0)
Immature Granulocytes: 0 %
Lymphocytes Relative: 32 %
Lymphs Abs: 2.2 10*3/uL (ref 0.7–4.0)
MCH: 31.3 pg (ref 26.0–34.0)
MCHC: 31.6 g/dL (ref 30.0–36.0)
MCV: 99.3 fL (ref 80.0–100.0)
Monocytes Absolute: 0.6 10*3/uL (ref 0.1–1.0)
Monocytes Relative: 8 %
Neutro Abs: 3.7 10*3/uL (ref 1.7–7.7)
Neutrophils Relative %: 53 %
Platelets: 279 10*3/uL (ref 150–400)
RBC: 4.02 MIL/uL (ref 3.87–5.11)
RDW: 12.7 % (ref 11.5–15.5)
WBC: 7 10*3/uL (ref 4.0–10.5)
nRBC: 0 % (ref 0.0–0.2)

## 2018-09-07 LAB — URINALYSIS, ROUTINE W REFLEX MICROSCOPIC
Bilirubin Urine: NEGATIVE
Glucose, UA: NEGATIVE mg/dL
Ketones, ur: NEGATIVE mg/dL
Nitrite: POSITIVE — AB
PROTEIN: NEGATIVE mg/dL
SPECIFIC GRAVITY, URINE: 1.011 (ref 1.005–1.030)
WBC, UA: >50 WBC/hpf — ABNORMAL HIGH (ref 0–5)
pH: 7 (ref 5.0–8.0)

## 2018-09-07 LAB — BASIC METABOLIC PANEL
Anion gap: 9 (ref 5–15)
BUN: 19 mg/dL (ref 8–23)
CO2: 24 mmol/L (ref 22–32)
Calcium: 8.6 mg/dL — ABNORMAL LOW (ref 8.9–10.3)
Chloride: 108 mmol/L (ref 98–111)
Creatinine, Ser: 1.13 mg/dL — ABNORMAL HIGH (ref 0.44–1.00)
GFR calc Af Amer: 52 mL/min — ABNORMAL LOW (ref >60)
GFR, EST NON AFRICAN AMERICAN: 45 mL/min — AB (ref >60)
Glucose, Bld: 86 mg/dL (ref 70–99)
Potassium: 4.4 mmol/L (ref 3.5–5.1)
SODIUM: 141 mmol/L (ref 135–145)

## 2018-09-07 MED ORDER — CEPHALEXIN 500 MG PO CAPS
500.0000 mg | ORAL_CAPSULE | Freq: Once | ORAL | Status: AC
  Administered 2018-09-07: 500 mg via ORAL
  Filled 2018-09-07: qty 1

## 2018-09-07 MED ORDER — CEPHALEXIN 250 MG PO CAPS: 250.0000 mg | ORAL_CAPSULE | Freq: Four times a day (QID) | ORAL | 0 refills | Status: AC

## 2018-09-07 NOTE — Telephone Encounter (Signed)
Will monitor for possible ED arrival.

## 2018-09-07 NOTE — Telephone Encounter (Signed)
Husband wants to stop medication. He also needs help with patient- he states in her present state- he is not able to move her enough to get her to an appointment.  Call to office- reviewed patient with Ashtyn. Patient has history of hematuria and UTI- she could have that going on- really don't think the medication is cause of all symptoms. Advised EMS to home to evaluate patient. Spoke to husband and he is in agreement. Also let husband know that if patient does go to hospital for treatment- he may ask for social worker to start paperwork for help in home. Told patient to call office if he needs any help at all. Reason for Disposition . [1] SEVERE weakness (i.e., unable to walk or barely able to walk, requires support) AND [2] new onset or worsening    Patient is not able to help with her on movement anymore. She was helping some- but now is not helping at all. Patient is also having fatigue, increased confusion and no appetite. She is sleeping all day- which is unlike her.  Answer Assessment - Initial Assessment Questions 1. SYMPTOM: "What is the main symptom you are concerned about?" (e.g., weakness, numbness)     Weakness- patient is unable to help herself, patient has been confused- not following direction without patience and effort 2. ONSET: "When did this start?" (minutes, hours, days; while sleeping)     Rx pravastatin 1/20- patient started new symptoms late yesterday 3. LAST NORMAL: "When was the last time you were normal (no symptoms)?"     Change is ongoing and patient seems to be deteriating quickly 4. PATTERN "Does this come and go, or has it been constant since it started?"  "Is it present now?"     Constant since started 5. CARDIAC SYMPTOMS: "Have you had any of the following symptoms: chest pain, difficulty breathing, palpitations?"     During night she has sleep apnea on back 6. NEUROLOGIC SYMPTOMS: "Have you had any of the following symptoms: headache, dizziness, vision loss,  double vision, changes in speech, unsteady on your feet?"     Change in speech- finding words is hard 7. OTHER SYMPTOMS: "Do you have any other symptoms?"     Confusion, fatigue, no appetite, back in bed 8. PREGNANCY: "Is there any chance you are pregnant?" "When was your last menstrual period?"     n/a  Protocols used: NEUROLOGIC DEFICIT-A-AH

## 2018-09-07 NOTE — Discharge Instructions (Addendum)
Kathryn [name],  Kathryn [name] are being treated for an infection in your bladder. I have sent in an antibiotic to take for 5 days. I'd like you to see your primary care doctor in about a week to make sure you are feeling better.

## 2018-09-07 NOTE — Telephone Encounter (Signed)
Message from Berneta Levins sent at 09/07/2018 11:10 AM EST   Summary: side effects of medicaiton   Pt's spouse calling in. States that pt was given pravastatin (PRAVACHOL) 10 MG tablet at last OV with Dr. [name]. Since then pt's mental and physical health has gone downhill. Pt is confused, weak, no appetitie, unable to feed herself this morning. Pt was up multiple times last night looking for the restroom but unable to even properly sit down on the toilet to use the bathroom.  Pt's spouse states that he is unable to get pt into the car to get her to the doctors office. Pt has gone back to bed at this time. Pt also has a very bad twitch in her hands and head. Pt has been able to communicate up until last night.         Called pt's husband back. He was attempting to get her to the bathroom. He will call back in about 10 or 15 mins.

## 2018-09-07 NOTE — ED Triage Notes (Signed)
Per EMS: Pt has reportedly had decreased urination with dark urine. Pt is reportedly more altered than normal, as she has a hx of alzheimer's.

## 2018-09-07 NOTE — ED Notes (Signed)
Bed: JW92 Expected date:  Expected time:  Means of arrival:  Comments: EMS- 72s F, UTI

## 2018-09-07 NOTE — ED Provider Notes (Signed)
[facility] [place] DEPT Provider Note   CSN: [id number] Arrival date & time: 09/07/18  1337     History   Chief Complaint Chief Complaint  Patient presents with  . Altered Mental Status    HPI Kathryn [name] is a 85 y.o. female with history of Alzheimer's, HTN presents for altered mental status compared to baseline. History obtained by husband who accompanied her at bedside. He notes decreased PO intake beginning last night and that she seemed more tired than normal. He endorsed that she had the sensation that she needed her poise pad changed twice but did not have any urine output. She had a normal bowel movement this morning and has not complained of any other pain. No recent falls.     Past Medical History:  Diagnosis Date  . Alzheimer's dementia ([place])   . Anxiety   . Headache   . Hypertension     Patient Active Problem List   Diagnosis Date Noted  . PAD (peripheral artery disease) ([place]) 07/20/2018  . HLD (hyperlipidemia) 06/15/2018  . Unstable gait 02/19/2018  . Aphasia 09/14/2017  . Headache 07/10/2016  . CKD (chronic kidney disease), stage III ([place]) 06/20/2016  . Dementia ([place]) 09/04/2015  . Generalized anxiety disorder 05/29/2015  . HTN (hypertension) 05/29/2015  . Memory loss 05/28/2015    Past Surgical History:  Procedure Laterality Date  . BREAST BIOPSY       OB History   No obstetric history on file.      Home Medications    Prior to Admission medications   Medication Sig Start Date End Date Taking? Authorizing Provider  aspirin EC 81 MG tablet Take 1 tablet (81 mg total) by mouth daily. 06/15/18  Yes Melvenia Beam, MD  Cholecalciferol (VITAMIN D3) 2000 UNITS TABS Take 3,000 Units by mouth daily.    Yes [provider]  Coenzyme Q10 (COQ10 PO) Take by mouth.   Yes [provider]  escitalopram (LEXAPRO) 10 MG tablet Take 10 mg by mouth daily. 08/28/18  Yes [provider]  folic acid (FOLVITE)  846 MCG tablet Take 400 mcg by mouth daily.   Yes [provider]  gabapentin (NEURONTIN) 100 MG capsule Take 1 capsule (100 mg total) by mouth 3 (three) times daily as needed. 09/14/17  Yes Melvenia Beam, MD  Omega-3 Fatty Acids (FISH OIL) 1000 MG CAPS Take by mouth.   Yes [provider]  Potassium Gluconate 550 MG TABS Take by mouth.   Yes [provider]  pravastatin (PRAVACHOL) 10 MG tablet Take 1 tablet (10 mg total) by mouth daily. 08/23/18  Yes [name], [name], MD  pyridoxine (B-6) 100 MG tablet Take 100 mg by mouth daily.   Yes [provider]  rivastigmine (EXELON) 6 MG capsule TAKE 1 CAPSULE (6 MG TOTAL) 2 (TWO) TIMES DAILY BY MOUTH 06/08/18  Yes Melvenia Beam, MD  zinc gluconate 50 MG tablet Take 50 mg by mouth daily.   Yes [provider]  cephALEXin (KEFLEX) 250 MG capsule Take 1 capsule (250 mg total) by mouth 4 (four) times daily for 5 days. 09/07/18 09/12/18  Matteo Banke D, DO  NON FORMULARY MK-7 90 meq    [provider]    Family History Family History  Problem Relation Age of Onset  . Thyroid disease Mother   . Alzheimer's disease Mother   . Dementia Mother   . Hearing loss Brother   . Hearing loss Brother  also developing memory problems  . Heart Problems Brother     Social History Social History   Tobacco Use  . Smoking status: Former Smoker    Last attempt to quit: 1980    Years since quitting: 40.1  . Smokeless tobacco: Never Used  Substance Use Topics  . Alcohol use: Yes    Alcohol/week: 7.0 - 14.0 standard drinks    Types: 7 - 14 Glasses of wine per week    Comment: white wine  . Drug use: No     Allergies   Aricept [donepezil hcl] and Sulfonamide derivatives   Review of Systems Review of Systems  All other systems reviewed and are negative.    Physical Exam Updated Vital Signs BP (!) 124/58 (BP Location: Right Arm)   Pulse 67   Temp 98.3 F (36.8 C) (Oral)   Resp 14    SpO2 98%   Physical Exam Vitals signs reviewed.  Constitutional:      Comments: Awake, pleasantly demented female, non-toxic appearing.   HENT:     Head: Normocephalic and atraumatic.     Mouth/Throat:     Mouth: Mucous membranes are moist.  Eyes:     Pupils: Pupils are equal, round, and reactive to light.  Neck:     Musculoskeletal: Neck supple.  Cardiovascular:     Rate and Rhythm: Normal rate and regular rhythm.  Pulmonary:     Effort: Pulmonary effort is normal.     Breath sounds: Normal breath sounds.  Abdominal:     General: Bowel sounds are normal.     Palpations: Abdomen is soft.     Tenderness: There is abdominal tenderness in the suprapubic area.  Musculoskeletal:     Right lower leg: No edema.     Left lower leg: No edema.  Skin:    General: Skin is warm and dry.  Neurological:     General: No focal deficit present.      ED Treatments / Results  Labs (all labs ordered are listed, but only abnormal results are displayed) Labs Reviewed  BASIC METABOLIC PANEL - Abnormal; Notable for the following components:      Result Value   Creatinine, Ser 1.13 (*)    Calcium 8.6 (*)    GFR calc non Af Amer 45 (*)    GFR calc Af Amer 52 (*)    All other components within normal limits  URINALYSIS, ROUTINE W REFLEX MICROSCOPIC - Abnormal; Notable for the following components:   APPearance CLOUDY (*)    Hgb urine dipstick SMALL (*)    Nitrite POSITIVE (*)    Leukocytes, UA LARGE (*)    WBC, UA >50 (*)    Bacteria, UA MANY (*)    All other components within normal limits  URINE CULTURE  CBC WITH DIFFERENTIAL/PLATELET    EKG None  Radiology No results found.  Procedures Procedures (including critical care time)  Medications Ordered in ED Medications - No data to display   Initial Impression / Assessment and Plan / ED Course  I have reviewed the triage vital signs and the nursing notes.  Pertinent labs & imaging results that were available during my  care of the patient were reviewed by me and considered in my medical decision making (see chart for details).  Patient with history of Dementia presenting with worsening mental status from baseline per husband. She does not complain of any pain anywhere, but does have some mild suprapubic tenderness on exam. She is afebrile  without leukocytosis. Urine sample obtained from I&O cath is pending. No other systemic signs or symptoms of infection. No recent medication changes that would contribute to her altered mental status.     Final Clinical Impressions(s) / ED Diagnoses   Final diagnoses:  Acute cystitis without hematuria   U/A consistent with acute cystitis which explains her acute delirium on chronic dementia. No record of positive urine culture per chart review. Will send culture and initiate 5 day course of Keflex.   ED Discharge Orders         Ordered    cephALEXin (KEFLEX) 250 MG capsule  4 times daily     09/07/18 [address] D, DO 09/07/18 1507    Jola Schmidt, MD 09/07/18 1704

## 2018-09-08 NOTE — Telephone Encounter (Signed)
Pt evaluated at [facility] ED.

## 2018-09-10 LAB — URINE CULTURE

## 2018-09-11 ENCOUNTER — Telehealth: Payer: Self-pay | Admitting: Emergency Medicine

## 2018-09-11 NOTE — Telephone Encounter (Signed)
Post ED Visit - Positive Culture Follow-up  Culture report reviewed by antimicrobial stewardship pharmacist:  []  Elenor Quinones, Pharm.D. []  Heide Guile, Pharm.D., BCPS AQ-ID []  Parks Neptune, Pharm.D., BCPS []  Alycia Rossetti, Pharm.D., BCPS []  [place], Pharm.D., BCPS, AAHIVP []  Legrand Como, Pharm.D., BCPS, AAHIVP []  Salome Arnt, PharmD, BCPS []  Johnnette Gourd, PharmD, BCPS []  Hughes Better, PharmD, BCPS [x]  Albertina Parr, PharmD  Positive urine culture Treated with Cephalexin, organism sensitive to the same and no further patient follow-up is required at this time.  Larene Beach Bevin Mayall 09/11/2018, 3:25 PM

## 2018-09-15 ENCOUNTER — Ambulatory Visit (INDEPENDENT_AMBULATORY_CARE_PROVIDER_SITE_OTHER): Payer: PPO | Admitting: Family Medicine

## 2018-09-15 ENCOUNTER — Encounter: Payer: Self-pay | Admitting: Family Medicine

## 2018-09-15 VITALS — BP 118/74 | HR 69 | Temp 97.8°F | Resp 12 | Ht 62.0 in | Wt 125.0 lb

## 2018-09-15 DIAGNOSIS — G3109 Other frontotemporal dementia: Secondary | ICD-10-CM | POA: Diagnosis not present

## 2018-09-15 DIAGNOSIS — I10 Essential (primary) hypertension: Secondary | ICD-10-CM | POA: Diagnosis not present

## 2018-09-15 DIAGNOSIS — N183 Chronic kidney disease, stage 3 unspecified: Secondary | ICD-10-CM

## 2018-09-15 DIAGNOSIS — F411 Generalized anxiety disorder: Secondary | ICD-10-CM | POA: Diagnosis not present

## 2018-09-15 DIAGNOSIS — F0280 Dementia in other diseases classified elsewhere without behavioral disturbance: Secondary | ICD-10-CM

## 2018-09-15 DIAGNOSIS — N390 Urinary tract infection, site not specified: Secondary | ICD-10-CM | POA: Diagnosis not present

## 2018-09-15 NOTE — Progress Notes (Signed)
HPI:   Kathryn [name] is a 85 y.o. female, who is here today to follow on recent ER visit.   Mr England provides Hx.  She presented to the ER via EMS on 09/07/18 because fatigue,decreased appetite,and MS changes.  Dx with UTI,treated with Keflex x 5 days. She tolerated medication well. She is not having urinary symptoms.  MS is back to her baseline. Eating normal now.  CKD and HTN: She is not drinking much fluids. Husband tried to offer 3 glasses of water per day. Negative for gross hematuria,decreased urine output,or foam in urine.  She is on non pharmacologic treatment. She follows low salt diet.  Lab Results  Component Value Date   CREATININE 1.13 (H) 09/07/2018   BUN 19 09/07/2018   NA 141 09/07/2018   K 4.4 09/07/2018   CL 108 09/07/2018   CO2 24 09/07/2018   Husband is reporting episodes of urine incontinence. She forgot to wipe after having bowel movement and frequently she forgot to pull her underwear down before urinating. She states that she tries to remember but still forgets things.  She is on Exelo 6 mg bid. She is following with Dr [name] periodically.  She attends adult daycare 3 times per week. Her insurance does not cover aid to help with ADL's. She needs assistance with showering,dressing,and grooming. Unstable gait,she does not want to use a walker or cane.  Anxiety seems to be better since she started Lexapro 10 mg. No depressed mood. Some frustration sometimes.   Review of Systems  Constitutional: Positive for appetite change (improved.). Negative for fatigue.  Respiratory: Negative for shortness of breath and wheezing.   Cardiovascular: Negative for chest pain and leg swelling.  Gastrointestinal: Negative for abdominal pain, constipation and vomiting.  Genitourinary: Negative for dysuria, frequency, hematuria, vaginal bleeding and vaginal discharge.  Musculoskeletal: Positive for gait problem. Negative for myalgias.    Psychiatric/Behavioral: Positive for confusion. The patient is nervous/anxious.     Current Outpatient Medications on File Prior to Visit  Medication Sig Dispense Refill  . aspirin EC 81 MG tablet Take 1 tablet (81 mg total) by mouth daily. 30 tablet 11  . Cholecalciferol (VITAMIN D3) 2000 UNITS TABS Take 3,000 Units by mouth daily.     . Coenzyme Q10 (COQ10 PO) Take by mouth.    . escitalopram (LEXAPRO) 10 MG tablet Take 10 mg by mouth daily.    . folic acid (FOLVITE) 093 MCG tablet Take 400 mcg by mouth daily.    Marland Kitchen gabapentin (NEURONTIN) 100 MG capsule Take 1 capsule (100 mg total) by mouth 3 (three) times daily as needed. (Patient taking differently: Take 100 mg by mouth 2 (two) times daily. ) 270 capsule 6  . NON FORMULARY MK-7 90 meq    . Omega-3 Fatty Acids (FISH OIL) 1000 MG CAPS Take by mouth.    . Potassium Gluconate 550 MG TABS Take by mouth.    . pravastatin (PRAVACHOL) 10 MG tablet Take 1 tablet (10 mg total) by mouth daily. 90 tablet 1  . pyridoxine (B-6) 100 MG tablet Take 100 mg by mouth daily.    . rivastigmine (EXELON) 6 MG capsule TAKE 1 CAPSULE (6 MG TOTAL) 2 (TWO) TIMES DAILY BY MOUTH 60 capsule 11  . zinc gluconate 50 MG tablet Take 50 mg by mouth daily.     No current facility-administered medications on file prior to visit.      Past Medical History:  Diagnosis Date  .  Alzheimer's dementia ([place])   . Anxiety   . Headache   . Hypertension    Allergies  Allergen Reactions  . Aricept [Donepezil Hcl] Other (See Comments)    "giving stomach and digestive problems"  . Sulfonamide Derivatives     REACTION: Swollen joints    Social History   Socioeconomic History  . Marital status: Married    Spouse name: [name]   . Number of children: 3  . Years of education: 26  . Highest education level: Not on file  Occupational History  . Not on file  Social Needs  . Financial resource strain: Not on file  . Food insecurity:    Worry: Not on file     Inability: Not on file  . Transportation needs:    Medical: Not on file    Non-medical: Not on file  Tobacco Use  . Smoking status: Former Smoker    Last attempt to quit: 1980    Years since quitting: 40.1  . Smokeless tobacco: Never Used  Substance and Sexual Activity  . Alcohol use: Yes    Alcohol/week: 7.0 - 14.0 standard drinks    Types: 7 - 14 Glasses of wine per week    Comment: white wine  . Drug use: No  . Sexual activity: Not on file  Lifestyle  . Physical activity:    Days per week: Not on file    Minutes per session: Not on file  . Stress: Not on file  Relationships  . Social connections:    Talks on phone: Not on file    Gets together: Not on file    Attends religious service: Not on file    Active member of club or organization: Not on file    Attends meetings of clubs or organizations: Not on file    Relationship status: Not on file  Other Topics Concern  . Not on file  Social History Narrative   Lives at home with husband   Caffeine use: Drinks coffee/tea    Right handed   Goes to [address] memory day care for 4 hours twice weekly    Vitals:   09/15/18 1123  BP: 118/74  Pulse: 69  Resp: 12  Temp: 97.8 F (36.6 C)  SpO2: 98%   Body mass index is 22.86 kg/m.  Physical Exam  Nursing note and vitals reviewed. Constitutional: She appears well-developed. No distress.  HENT:  Head: Normocephalic and atraumatic.  Mouth/Throat: Oropharynx is clear and moist and mucous membranes are normal.  Eyes: Pupils are equal, round, and reactive to light. Conjunctivae are normal.  Cardiovascular: Normal rate and regular rhythm.  No murmur heard. Respiratory: Effort normal and breath sounds normal. No respiratory distress.  GI: Soft. There is no abdominal tenderness.  Musculoskeletal:        General: No edema.  Lymphadenopathy:    She has no cervical adenopathy.  Neurological: She is alert. She has normal strength. No cranial nerve deficit. Gait abnormal.   Oriented in person and place.  Skin: Skin is warm. No rash noted. No erythema.  Psychiatric: Her mood appears anxious.  Well groomed, good eye contact.    ASSESSMENT AND PLAN:  Ms. Kathryn [name] was seen today for follow-up.  Diagnoses and all orders for this visit:  Urinary tract infection without hematuria, site unspecified Negative for urinary symptoms. Husband reporting that she is back to her baseline. Adequate hydration. Recommend trying to change pull ups frequently. Instructed about warning signs.   Generalized anxiety  disorder She is doing well with Lexapro 10 mg, no changes in current management.  HTN (hypertension) BP adequately controlled today. Continue nonpharmacologic treatment.  Dementia Declining. We discussed natural history of disease and prognosis. Continue Exelon 6 mg. Recommend trying OTC vitamin D 400 units twice daily. Keep next appointment with Dr. Lavell Anchors.  CKD (chronic kidney disease), stage III Renal function slightly worse, most likely due to mild dehydration. Recommend increasing fluid intake. Continue avoiding NSAIDs. Her baseline is Cr 0.9 to 1.17 and e GFR 54-57. We will plan on checking BMP next visit.   Return for Keep next appt.    Kathryn Drew G. [name], MD  [facility]. [place] office.

## 2018-09-15 NOTE — Assessment & Plan Note (Signed)
BP adequately controlled today. Continue nonpharmacologic treatment.

## 2018-09-15 NOTE — Assessment & Plan Note (Signed)
She is doing well with Lexapro 10 mg, no changes in current management.

## 2018-09-15 NOTE — Assessment & Plan Note (Signed)
Renal function slightly worse, most likely due to mild dehydration. Recommend increasing fluid intake. Continue avoiding NSAIDs. Her baseline is Cr 0.9 to 1.17 and e GFR 54-57. We will plan on checking BMP next visit.

## 2018-09-15 NOTE — Assessment & Plan Note (Signed)
Declining. We discussed natural history of disease and prognosis. Continue Exelon 6 mg. Recommend trying OTC vitamin D 400 units twice daily. Keep next appointment with Dr. Lavell Anchors.

## 2018-09-15 NOTE — Patient Instructions (Addendum)
A few things to remember from today's visit:   Urinary tract infection without hematuria, site unspecified  Generalized anxiety disorder  Other frontotemporal dementia without behavioral disturbance (HCC)  Essential hypertension Urine tract infection has resolved. Adequate hydration and frequent changing of pads.  Alzheimer Disease Caregiver Guide  Alzheimer disease causes a person to lose the ability to remember things and make decisions. A person who has Alzheimer disease may not be able to take care of himself or herself. He or she may need help with simple tasks. The tips below can help you care for the person. What kind of changes does this condition cause? This condition makes a person:  Forget things.  Feel confused.  Act differently.  Have different moods. These things get worse with time. Tips to help with symptoms  Be calm and patient.  Respond with a simple, short answer.  Avoid correcting the person in a negative way.  Try not to take things personally, even if the person forgets your name.  Do not argue with the person. This may make the person more upset. Tips to lessen frustration  Make appointments and do daily tasks when the person is at his or her best.  Take your time. Simple tasks may take longer. Allow plenty of time to complete tasks.  Limit choices for the person.  Involve the person in what you are doing.  Keep a daily routine.  Avoid new or crowded places, if possible.  Use simple words, short sentences, and a calm voice. Only give one direction at a time.  Buy clothes and shoes that are easy to put on and take off.  Organize medicines in a pillbox for each day of the week.  Keep a calendar in a central location to remind the person of meetings or other activities.  Let people help if they offer. Take a break when needed. Tips to prevent injury  Keep floors clear. Remove rugs, magazine racks, and floor lamps.  Keep hallways  well-lit.  Put a handrail and non-slip mat in the bathtub or shower.  Put childproof locks on cabinets that have dangerous items in them. These items include medicine, alcohol, guns, toxic cleaning items, sharp tools, matches, and lighters.  Put locks on doors where the person cannot see or reach them. This helps the person to not wander out of the house and get lost.  Be prepared for emergencies. Keep a list of emergency phone numbers and addresses close by.  Bracelets may be worn that track location and identify the person as having memory problems. This should be worn at all times for safety. Tips for the future  Discuss financial and legal planning early. People with this disease have trouble managing their money as the disease gets worse. Get help from a professional.  Talk about advance directives, safety, and daily care. Take these steps: ? Create a living will and choose a power of attorney. This is someone who can make decisions for the person with Alzheimer disease when he or she can no longer do so. ? Discuss driving safety and when to stop driving. The person's doctor can help with this. ? If the person lives alone, make sure he or she is safe. Some people need extra help at home. Other people need more care at a nursing home or care center. Where to find support You can find support by joining a support group near you. Some benefits of joining a support group include:  Learning ways to manage  stress.  Sharing experiences with others.  Getting emotional comfort and support.  Learning about caregiving as the disease progresses.  Knowing what community resources are available and making use of them. Where to find more information  Alzheimer's Association: [employer] Contact a doctor if:  The person has a fever.  The person has a sudden behavior change that does not get better with calming strategies.  The person is not able to take care of himself or herself at  home.  The person threatens you or anyone else, including himself or herself.  You are no longer able to care for the person. Summary  Alzheimer disease causes a person to forget things and to be confused.  A person who has this condition may not be able to take care of himself or herself.  Take steps to keep the person from getting hurt. Plan for future care.  You can find support by joining a support group near you. This information is not intended to replace advice given to you by your health care provider. Make sure you discuss any questions you have with your health care provider. Document Released: 10/13/2011 Document Revised: 07/16/2017 Document Reviewed: 07/16/2017 Elsevier Interactive Patient Education  2019 [place].   No changes on Lexapro, Continue Aspirin and pravastatin.  Vitamin D 400 units twice daily.  Keep appointment with Dr. Lavell Anchors.   Please be sure medication list is accurate. If a new problem present, please set up appointment sooner than planned today.

## 2018-09-18 ENCOUNTER — Encounter: Payer: Self-pay | Admitting: Family Medicine

## 2018-09-20 ENCOUNTER — Other Ambulatory Visit: Payer: Self-pay

## 2018-09-20 NOTE — Telephone Encounter (Signed)
This encounter was created in error - please disregard.

## 2018-09-20 NOTE — Patient Outreach (Signed)
[facility]) Care Management  09/20/2018  Kathryn [name] [birth year] [id number]   Telephone Screen  Referral Date: 09/16/2018 Referral Source: Nurse Call Center Referral Reason: "09/16/2018-2:04pm-caller is needing info on referral that was supposed to be sent for his wife" Insurance: HTA   Outreach attempt #1 to patient. No answer. RN CM left HIPAA compliant voicemail message along with contact info.    Plan: RN CM will make outreach attempt to patient within 3-4 business days. RN CM will send unsuccessful outreach letter to patient.   Enzo Montgomery, RN,BSN,CCM [place] Management Telephonic Care Management Coordinator Direct Phone: [phone] Toll Free: [phone] Fax: [phone]

## 2018-09-20 NOTE — Patient Outreach (Signed)
[facility]) Care Management  09/20/2018  ANSLEIGH SAFER 08/19/1933 [id number]   Telephone Screen  Referral Date: 09/16/2018 Referral Source: Nurse Call Center Referral Reason: "09/16/2018-2:04pm-caller is needing info on referral that was supposed to be sent for his wife" Insurance: HTA   Incoming call from patient's spouse returning RN CM call. Spouse(Charles) is HC POA(copy on file). Discussed recent call to 24 hr nurse line. Spouse reports he was given the info that needs. He reports that he plans to call PCP office today to follow up on the issue. He denies any further RN CM needs or concerns at this time. Spouse advised to feel free to call 24 hr Nurse Line for any future needs or concerns. He voiced understanding and was appreciative of follow up call.    Plan: RN CM will close case as no further interventions needed at this time.   Enzo Montgomery, RN,BSN,CCM [place] Management Telephonic Care Management Coordinator Direct Phone: [phone] Toll Free: [phone] Fax: [phone]

## 2018-09-22 ENCOUNTER — Telehealth: Payer: Self-pay | Admitting: Family Medicine

## 2018-09-22 NOTE — Telephone Encounter (Signed)
Message sent to Dr. Jordan for review and approval. 

## 2018-09-22 NOTE — Telephone Encounter (Signed)
Copied from [place] [id number]. Topic: Quick Communication - See Telephone Encounter >> Sep 22, 2018 10:59 AM Vernona Rieger wrote: CRM for notification. See Telephone encounter for: 09/22/18.  Patient's husband is calling and stating that she seen Dr [name] 2/12 for a follow up for a UTI. He said she finished the antibiotic prior to this appt with Dr [name], he said she is complaining again of urinary burning, frequent trips to the bathroom and she is confused. He would like to know could another round be called in for her? He said if so , please let him know because he has to go to the pharmacy later for himself.  [id number] - [place], [place] [address]. [place] [address] Phone: [phone] Fax: [phone]

## 2018-09-22 NOTE — Telephone Encounter (Signed)
Please have patient come into the lab for a urine test and culture. Thanks, BJ

## 2018-09-24 ENCOUNTER — Encounter: Payer: Self-pay | Admitting: Family Medicine

## 2018-09-24 ENCOUNTER — Ambulatory Visit (INDEPENDENT_AMBULATORY_CARE_PROVIDER_SITE_OTHER): Payer: PPO | Admitting: Family Medicine

## 2018-09-24 VITALS — BP 116/80 | HR 82 | Temp 97.8°F | Resp 12 | Ht 62.0 in | Wt 123.4 lb

## 2018-09-24 DIAGNOSIS — F0280 Dementia in other diseases classified elsewhere without behavioral disturbance: Secondary | ICD-10-CM

## 2018-09-24 DIAGNOSIS — K5900 Constipation, unspecified: Secondary | ICD-10-CM | POA: Insufficient documentation

## 2018-09-24 DIAGNOSIS — M159 Polyosteoarthritis, unspecified: Secondary | ICD-10-CM | POA: Diagnosis not present

## 2018-09-24 DIAGNOSIS — G3109 Other frontotemporal dementia: Secondary | ICD-10-CM

## 2018-09-24 DIAGNOSIS — N390 Urinary tract infection, site not specified: Secondary | ICD-10-CM

## 2018-09-24 DIAGNOSIS — R8290 Unspecified abnormal findings in urine: Secondary | ICD-10-CM | POA: Diagnosis not present

## 2018-09-24 DIAGNOSIS — R300 Dysuria: Secondary | ICD-10-CM

## 2018-09-24 LAB — POCT URINALYSIS DIPSTICK
Bilirubin, UA: NEGATIVE
GLUCOSE UA: NEGATIVE
Ketones, UA: NEGATIVE
Nitrite, UA: NEGATIVE
PROTEIN UA: POSITIVE — AB
Spec Grav, UA: 1.01 (ref 1.010–1.025)
Urobilinogen, UA: 0.2 E.U./dL
pH, UA: 7.5 (ref 5.0–8.0)

## 2018-09-24 MED ORDER — CIPROFLOXACIN HCL 250 MG PO TABS: 250.0000 mg | ORAL_TABLET | Freq: Two times a day (BID) | ORAL | 0 refills | Status: DC

## 2018-09-24 NOTE — Telephone Encounter (Signed)
Patient seen in office 09/24/2018 for sx. Nothing further needed at this time.

## 2018-09-24 NOTE — Patient Instructions (Signed)
A few things to remember from today's visit:   Abnormal urine odor - Plan: POC Urinalysis Dipstick  Burning with urination - Plan: POC Urinalysis Dipstick  Urinary tract infection without hematuria, site unspecified - Plan: ciprofloxacin (CIPRO) 250 MG tablet  Constipation, unspecified constipation type - Plan: Ambulatory referral to [place]  Generalized osteoarthritis of multiple sites - Plan: Ambulatory referral to [place]  Other frontotemporal dementia without behavioral disturbance ([place]) - Plan: Ambulatory referral to [place] daily as needed. Depends. Adequate fiber intake and hydration. Fall precautions. Ciprofloxacin 250 mg twice daily for 5 days.   Please be sure medication list is accurate. If a new problem present, please set up appointment sooner than planned today.

## 2018-09-24 NOTE — Progress Notes (Signed)
HPI:  Chief Complaint  Patient presents with  . Urine odor    sx started Wednesday morning  . Burning with urination    [name] is a 85 y.o. female, who is here today with her husband because he is concerned about UTI. Husband provides Hx.  She was recently treated for UTI, completed Keflex a few days ago.  She c/o dysuria a couple days ago once. Husband has not noted urgency or increased frequency. Negative for gross hematuria or abdominal pain. + Odorous urine. Negative for vaginal bleeding or discharge.  No changes in appetite or daily living activities.  No falls since 07/2018. +Unstable gait, Hx of generalized OA. She has done PT in the past and has helped. She is not longer doing yoga.   Husband is also concerned about constipation,worse for the past 10 days. Last bowel movement today after 3 days of not having one. Husband has giving her Miralax for the past 2 days. She eats fruits and vegetables. She does not drink much fluids,husband tries to offer water through the day.  Her husband also discontinued Pravastatin yesterday. He thinks she was having side effects but he does not specified in this regard.  She is declining , forgetting to pull underwear down when she needs to use the toilet. It is getting harder for her husband to take care of her.  She is going to adult day care, [facility] care 3 times per week. Husband is trying to get home care at home to help with ADL's.   Review of Systems  Constitutional: Negative for activity change, appetite change, fatigue and fever.  Gastrointestinal: Positive for constipation. Negative for abdominal pain, nausea and vomiting.  Genitourinary: Positive for dysuria. Negative for decreased urine volume, frequency, hematuria, vaginal bleeding and vaginal discharge.  Musculoskeletal: Negative for back pain and myalgias.  Psychiatric/Behavioral: Positive for confusion. The patient is  nervous/anxious.     Current Outpatient Medications on File Prior to Visit  Medication Sig Dispense Refill  . aspirin EC 81 MG tablet Take 1 tablet (81 mg total) by mouth daily. 30 tablet 11  . Cholecalciferol (VITAMIN D3) 2000 UNITS TABS Take 3,000 Units by mouth daily.     . Coenzyme Q10 (COQ10 PO) Take by mouth.    . escitalopram (LEXAPRO) 10 MG tablet Take 10 mg by mouth daily.    . folic acid (FOLVITE) 008 MCG tablet Take 400 mcg by mouth daily.    Marland Kitchen gabapentin (NEURONTIN) 100 MG capsule Take 1 capsule (100 mg total) by mouth 3 (three) times daily as needed. (Patient taking differently: Take 100 mg by mouth 2 (two) times daily. ) 270 capsule 6  . NON FORMULARY MK-7 90 meq    . Omega-3 Fatty Acids (FISH OIL) 1000 MG CAPS Take by mouth.    . Potassium Gluconate 550 MG TABS Take by mouth.    . pyridoxine (B-6) 100 MG tablet Take 100 mg by mouth daily.    . rivastigmine (EXELON) 6 MG capsule TAKE 1 CAPSULE (6 MG TOTAL) 2 (TWO) TIMES DAILY BY MOUTH 60 capsule 11  . zinc gluconate 50 MG tablet Take 50 mg by mouth daily.    . pravastatin (PRAVACHOL) 10 MG tablet Take 1 tablet (10 mg total) by mouth daily. (Patient not taking: Reported on 09/24/2018) 90 tablet 1   No current facility-administered medications on file prior to visit.      Past Medical History:  Diagnosis Date  .  Alzheimer's dementia ([place])   . Anxiety   . Headache   . Hypertension    Allergies  Allergen Reactions  . Aricept [Donepezil Hcl] Other (See Comments)    "giving stomach and digestive problems"  . Sulfonamide Derivatives     REACTION: Swollen joints    Social History   Socioeconomic History  . Marital status: Married    Spouse name: Kathryn [name]   . Number of children: 3  . Years of education: 56  . Highest education level: Not on file  Occupational History  . Not on file  Social Needs  . Financial resource strain: Not on file  . Food insecurity:    Worry: Not on file    Inability: Not on file  .  Transportation needs:    Medical: Not on file    Non-medical: Not on file  Tobacco Use  . Smoking status: Former Smoker    Last attempt to quit: 1980    Years since quitting: 40.1  . Smokeless tobacco: Never Used  Substance and Sexual Activity  . Alcohol use: Yes    Alcohol/week: 7.0 - 14.0 standard drinks    Types: 7 - 14 Glasses of wine per week    Comment: white wine  . Drug use: No  . Sexual activity: Not on file  Lifestyle  . Physical activity:    Days per week: Not on file    Minutes per session: Not on file  . Stress: Not on file  Relationships  . Social connections:    Talks on phone: Not on file    Gets together: Not on file    Attends religious service: Not on file    Active member of club or organization: Not on file    Attends meetings of clubs or organizations: Not on file    Relationship status: Not on file  Other Topics Concern  . Not on file  Social History Narrative   Lives at home with husband   Caffeine use: Drinks coffee/tea    Right handed   Goes to [address] [facility] day care for 4 hours twice weekly    Vitals:   09/24/18 1143  BP: 116/80  Pulse: 82  Resp: 12  Temp: 97.8 F (36.6 C)  SpO2: 96%   Body mass index is 22.57 kg/m.  Physical Exam  Nursing note and vitals reviewed. Constitutional: She appears well-developed. No distress.  HENT:  Head: Normocephalic and atraumatic.  Mouth/Throat: Oropharynx is clear and moist and mucous membranes are normal.  Eyes: Conjunctivae are normal.  Cardiovascular: Normal rate and regular rhythm.  Respiratory: Effort normal and breath sounds normal. No respiratory distress.  GI: Soft. She exhibits no mass. There is no abdominal tenderness. There is no CVA tenderness.  Musculoskeletal:        General: No edema.  Lymphadenopathy:    She has no cervical adenopathy.  Neurological: She is alert.  Oriented in place and person. Unstable gait, assisted by husband.  Skin: Skin is warm. No rash noted. No  erythema.  Psychiatric: Her mood appears anxious.  Well groomed,good eye contact.    ASSESSMENT AND PLAN:   Ms. Kathryn [name] was seen today for urine odor and burning with urination.  Diagnoses and all orders for this visit:  Abnormal urine odor -     POC Urinalysis Dipstick  Burning with urination Resolved. Possible causes discussed.  -     POC Urinalysis Dipstick  Urinary tract infection without hematuria, site unspecified Urine dipstick abnormal.  Empiric abx treatment based on recent Ucx started today. Side effects of Cipro discussed.  Clearly instructed about warning signs.  -     ciprofloxacin (CIPRO) 250 MG tablet; Take 1 tablet (250 mg total) by mouth 2 (two) times daily for 5 days.  Constipation, unspecified constipation type Adequate hydration and fiber intake to continue. Miralax daily as needed. Instructed about warning signs.  Generalized osteoarthritis of multiple sites Contributing to unstable gait. PT through [facility] will be arrange.  -     Ambulatory referral to [place]  Other frontotemporal dementia without behavioral disturbance ([place]) Declining. She needs help with ADL's.  He will find out about insurance coverage. [place] service will be arranged. Husband is willing to pay for service if needed.  -     Ambulatory referral to [facility]     Return if symptoms worsen or fail to improve.     G. [name], MD  [facility]. [place] office.

## 2018-09-28 ENCOUNTER — Other Ambulatory Visit (HOSPITAL_COMMUNITY): Payer: PPO

## 2018-09-28 ENCOUNTER — Emergency Department (HOSPITAL_COMMUNITY): Payer: PPO

## 2018-09-28 ENCOUNTER — Inpatient Hospital Stay (HOSPITAL_COMMUNITY)
Admission: EM | Admit: 2018-09-28 | Discharge: 2018-10-06 | DRG: 469 | Disposition: A | Discharge status: Skilled Nursing Facility | Payer: PPO | Attending: Internal Medicine | Admitting: Internal Medicine

## 2018-09-28 ENCOUNTER — Other Ambulatory Visit: Payer: Self-pay

## 2018-09-28 ENCOUNTER — Encounter (HOSPITAL_COMMUNITY): Payer: Self-pay

## 2018-09-28 DIAGNOSIS — G309 Alzheimer's disease, unspecified: Secondary | ICD-10-CM | POA: Diagnosis present

## 2018-09-28 DIAGNOSIS — F0390 Unspecified dementia without behavioral disturbance: Secondary | ICD-10-CM | POA: Diagnosis present

## 2018-09-28 DIAGNOSIS — Z9889 Other specified postprocedural states: Secondary | ICD-10-CM

## 2018-09-28 DIAGNOSIS — Y92019 Unspecified place in single-family (private) house as the place of occurrence of the external cause: Secondary | ICD-10-CM

## 2018-09-28 DIAGNOSIS — F329 Major depressive disorder, single episode, unspecified: Secondary | ICD-10-CM | POA: Diagnosis present

## 2018-09-28 DIAGNOSIS — Z7982 Long term (current) use of aspirin: Secondary | ICD-10-CM | POA: Diagnosis not present

## 2018-09-28 DIAGNOSIS — N183 Chronic kidney disease, stage 3 unspecified: Secondary | ICD-10-CM | POA: Diagnosis present

## 2018-09-28 DIAGNOSIS — Z820 Family history of epilepsy and other diseases of the nervous system: Secondary | ICD-10-CM | POA: Diagnosis not present

## 2018-09-28 DIAGNOSIS — L8990 Pressure ulcer of unspecified site, unspecified stage: Secondary | ICD-10-CM | POA: Diagnosis not present

## 2018-09-28 DIAGNOSIS — J810 Acute pulmonary edema: Secondary | ICD-10-CM | POA: Diagnosis not present

## 2018-09-28 DIAGNOSIS — J189 Pneumonia, unspecified organism: Secondary | ICD-10-CM | POA: Diagnosis present

## 2018-09-28 DIAGNOSIS — F419 Anxiety disorder, unspecified: Secondary | ICD-10-CM | POA: Diagnosis not present

## 2018-09-28 DIAGNOSIS — R339 Retention of urine, unspecified: Secondary | ICD-10-CM | POA: Diagnosis not present

## 2018-09-28 DIAGNOSIS — Z87440 Personal history of urinary (tract) infections: Secondary | ICD-10-CM

## 2018-09-28 DIAGNOSIS — Z96642 Presence of left artificial hip joint: Secondary | ICD-10-CM | POA: Diagnosis not present

## 2018-09-28 DIAGNOSIS — R2689 Other abnormalities of gait and mobility: Secondary | ICD-10-CM | POA: Diagnosis not present

## 2018-09-28 DIAGNOSIS — G3109 Other frontotemporal dementia: Secondary | ICD-10-CM | POA: Diagnosis not present

## 2018-09-28 DIAGNOSIS — Z8619 Personal history of other infectious and parasitic diseases: Secondary | ICD-10-CM

## 2018-09-28 DIAGNOSIS — Z79899 Other long term (current) drug therapy: Secondary | ICD-10-CM | POA: Diagnosis not present

## 2018-09-28 DIAGNOSIS — I129 Hypertensive chronic kidney disease with stage 1 through stage 4 chronic kidney disease, or unspecified chronic kidney disease: Secondary | ICD-10-CM | POA: Diagnosis not present

## 2018-09-28 DIAGNOSIS — R278 Other lack of coordination: Secondary | ICD-10-CM | POA: Diagnosis not present

## 2018-09-28 DIAGNOSIS — D649 Anemia, unspecified: Secondary | ICD-10-CM | POA: Diagnosis not present

## 2018-09-28 DIAGNOSIS — R1311 Dysphagia, oral phase: Secondary | ICD-10-CM | POA: Diagnosis not present

## 2018-09-28 DIAGNOSIS — W010XXA Fall on same level from slipping, tripping and stumbling without subsequent striking against object, initial encounter: Secondary | ICD-10-CM | POA: Diagnosis present

## 2018-09-28 DIAGNOSIS — R262 Difficulty in walking, not elsewhere classified: Secondary | ICD-10-CM | POA: Diagnosis not present

## 2018-09-28 DIAGNOSIS — I739 Peripheral vascular disease, unspecified: Secondary | ICD-10-CM | POA: Diagnosis not present

## 2018-09-28 DIAGNOSIS — D72829 Elevated white blood cell count, unspecified: Secondary | ICD-10-CM

## 2018-09-28 DIAGNOSIS — W19XXXA Unspecified fall, initial encounter: Secondary | ICD-10-CM | POA: Diagnosis not present

## 2018-09-28 DIAGNOSIS — M2550 Pain in unspecified joint: Secondary | ICD-10-CM | POA: Diagnosis not present

## 2018-09-28 DIAGNOSIS — Z8349 Family history of other endocrine, nutritional and metabolic diseases: Secondary | ICD-10-CM

## 2018-09-28 DIAGNOSIS — J9621 Acute and chronic respiratory failure with hypoxia: Secondary | ICD-10-CM | POA: Diagnosis not present

## 2018-09-28 DIAGNOSIS — Z87891 Personal history of nicotine dependence: Secondary | ICD-10-CM

## 2018-09-28 DIAGNOSIS — L89312 Pressure ulcer of right buttock, stage 2: Secondary | ICD-10-CM | POA: Diagnosis not present

## 2018-09-28 DIAGNOSIS — Z882 Allergy status to sulfonamides status: Secondary | ICD-10-CM

## 2018-09-28 DIAGNOSIS — F0280 Dementia in other diseases classified elsewhere without behavioral disturbance: Secondary | ICD-10-CM | POA: Diagnosis not present

## 2018-09-28 DIAGNOSIS — R531 Weakness: Secondary | ICD-10-CM | POA: Diagnosis not present

## 2018-09-28 DIAGNOSIS — S299XXA Unspecified injury of thorax, initial encounter: Secondary | ICD-10-CM | POA: Diagnosis not present

## 2018-09-28 DIAGNOSIS — Z888 Allergy status to other drugs, medicaments and biological substances status: Secondary | ICD-10-CM

## 2018-09-28 DIAGNOSIS — S79912A Unspecified injury of left hip, initial encounter: Secondary | ICD-10-CM | POA: Diagnosis not present

## 2018-09-28 DIAGNOSIS — Z7401 Bed confinement status: Secondary | ICD-10-CM | POA: Diagnosis not present

## 2018-09-28 DIAGNOSIS — S72009A Fracture of unspecified part of neck of unspecified femur, initial encounter for closed fracture: Secondary | ICD-10-CM | POA: Diagnosis present

## 2018-09-28 DIAGNOSIS — S72042A Displaced fracture of base of neck of left femur, initial encounter for closed fracture: Secondary | ICD-10-CM | POA: Diagnosis not present

## 2018-09-28 DIAGNOSIS — Z01810 Encounter for preprocedural cardiovascular examination: Secondary | ICD-10-CM | POA: Diagnosis not present

## 2018-09-28 DIAGNOSIS — R5082 Postprocedural fever: Secondary | ICD-10-CM | POA: Diagnosis not present

## 2018-09-28 DIAGNOSIS — M25552 Pain in left hip: Secondary | ICD-10-CM | POA: Diagnosis not present

## 2018-09-28 DIAGNOSIS — S72002A Fracture of unspecified part of neck of left femur, initial encounter for closed fracture: Principal | ICD-10-CM | POA: Diagnosis present

## 2018-09-28 DIAGNOSIS — I509 Heart failure, unspecified: Secondary | ICD-10-CM

## 2018-09-28 DIAGNOSIS — S7292XD Unspecified fracture of left femur, subsequent encounter for closed fracture with routine healing: Secondary | ICD-10-CM | POA: Diagnosis not present

## 2018-09-28 DIAGNOSIS — Z471 Aftercare following joint replacement surgery: Secondary | ICD-10-CM | POA: Diagnosis not present

## 2018-09-28 DIAGNOSIS — R404 Transient alteration of awareness: Secondary | ICD-10-CM | POA: Diagnosis not present

## 2018-09-28 LAB — BASIC METABOLIC PANEL
Anion gap: 8 (ref 5–15)
BUN: 23 mg/dL (ref 8–23)
CO2: 28 mmol/L (ref 22–32)
Calcium: 9 mg/dL (ref 8.9–10.3)
Chloride: 105 mmol/L (ref 98–111)
Creatinine, Ser: 1.01 mg/dL — ABNORMAL HIGH (ref 0.44–1.00)
GFR calc Af Amer: >60 mL/min (ref >60)
GFR calc non Af Amer: 52 mL/min — ABNORMAL LOW (ref >60)
Glucose, Bld: 145 mg/dL — ABNORMAL HIGH (ref 70–99)
Potassium: 3.9 mmol/L (ref 3.5–5.1)
Sodium: 141 mmol/L (ref 135–145)

## 2018-09-28 LAB — CBC WITH DIFFERENTIAL/PLATELET
Abs Immature Granulocytes: 0.27 10*3/uL — ABNORMAL HIGH (ref 0.00–0.07)
Basophils Absolute: 0.1 10*3/uL (ref 0.0–0.1)
Basophils Relative: 0 %
EOS ABS: 0 10*3/uL (ref 0.0–0.5)
Eosinophils Relative: 0 %
HCT: 46.8 % — ABNORMAL HIGH (ref 36.0–46.0)
Hemoglobin: 14.7 g/dL (ref 12.0–15.0)
Immature Granulocytes: 1 %
Lymphocytes Relative: 4 %
Lymphs Abs: 0.9 10*3/uL (ref 0.7–4.0)
MCH: 31.3 pg (ref 26.0–34.0)
MCHC: 31.4 g/dL (ref 30.0–36.0)
MCV: 99.8 fL (ref 80.0–100.0)
Monocytes Absolute: 1 10*3/uL (ref 0.1–1.0)
Monocytes Relative: 4 %
Neutro Abs: 23.4 10*3/uL — ABNORMAL HIGH (ref 1.7–7.7)
Neutrophils Relative %: 91 %
Platelets: 288 10*3/uL (ref 150–400)
RBC: 4.69 MIL/uL (ref 3.87–5.11)
RDW: 12.6 % (ref 11.5–15.5)
WBC: 25.6 10*3/uL — ABNORMAL HIGH (ref 4.0–10.5)
nRBC: 0 % (ref 0.0–0.2)

## 2018-09-28 LAB — PROTIME-INR
INR: 1 (ref 0.8–1.2)
Prothrombin Time: 12.7 seconds (ref 11.4–15.2)

## 2018-09-28 LAB — URINALYSIS, ROUTINE W REFLEX MICROSCOPIC
Bilirubin Urine: NEGATIVE
Glucose, UA: NEGATIVE mg/dL
Hgb urine dipstick: NEGATIVE
Ketones, ur: 5 mg/dL — AB
Leukocytes,Ua: NEGATIVE
Nitrite: NEGATIVE
Protein, ur: NEGATIVE mg/dL
Specific Gravity, Urine: 1.019 (ref 1.005–1.030)
pH: 5 (ref 5.0–8.0)

## 2018-09-28 LAB — TROPONIN I: Troponin I: 0.03 ng/mL (ref <0.03)

## 2018-09-28 LAB — BRAIN NATRIURETIC PEPTIDE: B Natriuretic Peptide: 158.5 pg/mL — ABNORMAL HIGH (ref 0.0–100.0)

## 2018-09-28 MED ORDER — RIVASTIGMINE TARTRATE 1.5 MG PO CAPS
6.0000 mg | ORAL_CAPSULE | Freq: Two times a day (BID) | ORAL | Status: DC
  Administered 2018-09-28: 6 mg via ORAL
  Filled 2018-09-28 (×2): qty 4

## 2018-09-28 MED ORDER — ONDANSETRON HCL 4 MG/2ML IJ SOLN
4.0000 mg | Freq: Once | INTRAMUSCULAR | Status: AC
  Administered 2018-09-28: 4 mg via INTRAVENOUS
  Filled 2018-09-28: qty 2

## 2018-09-28 MED ORDER — FUROSEMIDE 10 MG/ML IJ SOLN
40.0000 mg | Freq: Once | INTRAMUSCULAR | Status: AC
  Administered 2018-09-28: 40 mg via INTRAVENOUS
  Filled 2018-09-28: qty 4

## 2018-09-28 MED ORDER — ESCITALOPRAM OXALATE 10 MG PO TABS
10.0000 mg | ORAL_TABLET | Freq: Every day | ORAL | Status: DC
  Administered 2018-09-29: 10 mg via ORAL
  Filled 2018-09-28: qty 1

## 2018-09-28 MED ORDER — POVIDONE-IODINE 10 % EX SWAB: 2.0000 "application " | Freq: Once | CUTANEOUS | Status: DC

## 2018-09-28 MED ORDER — SODIUM CHLORIDE 0.9 % IV BOLUS
500.0000 mL | Freq: Once | INTRAVENOUS | Status: AC
  Administered 2018-09-28: 500 mL via INTRAVENOUS

## 2018-09-28 MED ORDER — HYDROCODONE-ACETAMINOPHEN 5-325 MG PO TABS: 1.0000 | ORAL_TABLET | Freq: Four times a day (QID) | ORAL | Status: DC | PRN

## 2018-09-28 MED ORDER — MUPIROCIN 2 % EX OINT: 1.0000 "application " | TOPICAL_OINTMENT | Freq: Two times a day (BID) | CUTANEOUS | Status: DC

## 2018-09-28 MED ORDER — CEFAZOLIN SODIUM-DEXTROSE 2-4 GM/100ML-% IV SOLN
2.0000 g | INTRAVENOUS | Status: AC
  Administered 2018-09-29: 2 g via INTRAVENOUS
  Filled 2018-09-28: qty 100

## 2018-09-28 MED ORDER — SENNOSIDES-DOCUSATE SODIUM 8.6-50 MG PO TABS
1.0000 | ORAL_TABLET | Freq: Every evening | ORAL | Status: DC | PRN
  Administered 2018-10-04: 1 via ORAL
  Filled 2018-09-28: qty 1

## 2018-09-28 MED ORDER — DOCUSATE SODIUM 100 MG PO CAPS
100.0000 mg | ORAL_CAPSULE | Freq: Two times a day (BID) | ORAL | Status: DC
  Administered 2018-09-29: 100 mg via ORAL
  Filled 2018-09-28: qty 1

## 2018-09-28 MED ORDER — FOLIC ACID 1 MG PO TABS
500.0000 ug | ORAL_TABLET | Freq: Every day | ORAL | Status: DC
  Administered 2018-09-29 – 2018-10-06 (×8): 0.5 mg via ORAL
  Filled 2018-09-28 (×9): qty 1

## 2018-09-28 MED ORDER — VITAMIN B-6 100 MG PO TABS
100.0000 mg | ORAL_TABLET | Freq: Every day | ORAL | Status: DC
  Administered 2018-09-29 – 2018-10-06 (×8): 100 mg via ORAL
  Filled 2018-09-28 (×8): qty 1

## 2018-09-28 MED ORDER — CHLORHEXIDINE GLUCONATE 4 % EX LIQD
60.0000 mL | Freq: Once | CUTANEOUS | Status: AC
  Administered 2018-09-29: 4 via TOPICAL
  Filled 2018-09-28: qty 60

## 2018-09-28 MED ORDER — PRAVASTATIN SODIUM 10 MG PO TABS
10.0000 mg | ORAL_TABLET | Freq: Every day | ORAL | Status: DC
  Administered 2018-09-29 – 2018-10-06 (×8): 10 mg via ORAL
  Filled 2018-09-28 (×8): qty 1

## 2018-09-28 MED ORDER — MORPHINE SULFATE (PF) 2 MG/ML IV SOLN: 0.5000 mg | INTRAVENOUS | Status: DC | PRN

## 2018-09-28 NOTE — Consult Note (Signed)
Reason for Consult: Impacted closed left femoral neck fracture Referring Physician: Bonnielee Haff MD  Kathryn [name] is an 85 y.o. female.  HPI: 85 year old female with Alzheimer's whose husband is her care provider and is at the bedside.  Patient was standing cord to the husband and she turned and fell on her left side losing her balance.  Patient due to her dementia is not able to provide history.  On admission white count was 25,000 with hypoxia saturations in the 70s prior to oxygen and chest x-ray showing some pulmonary edema.  Past Medical History:  Diagnosis Date  . Alzheimer's dementia ([place])   . Anxiety   . Headache   . Hypertension     Past Surgical History:  Procedure Laterality Date  . BREAST BIOPSY      Family History  Problem Relation Age of Onset  . Thyroid disease Mother   . Alzheimer's disease Mother   . Dementia Mother   . Hearing loss Brother   . Hearing loss Brother        also developing memory problems  . Heart Problems Brother     Social History:  reports that she quit smoking about 40 years ago. She has never used smokeless tobacco. She reports current alcohol use of about 7.0 - 14.0 standard drinks of alcohol per week. She reports that she does not use drugs.  Allergies:  Allergies  Allergen Reactions  . Aricept [Donepezil Hcl] Other (See Comments)    "giving stomach and digestive problems"  . Sulfonamide Derivatives     REACTION: Swollen joints    Medications: I have reviewed the patient's current medications.  Results for orders placed or performed during the hospital encounter of 09/28/18 (from the past 48 hour(s))  Basic metabolic panel     Status: Abnormal   Collection Time: 09/28/18  1:39 PM  Result Value Ref Range   Sodium 141 135 - 145 mmol/L   Potassium 3.9 3.5 - 5.1 mmol/L   Chloride 105 98 - 111 mmol/L   CO2 28 22 - 32 mmol/L   Glucose, Bld 145 (H) 70 - 99 mg/dL   BUN 23 8 - 23 mg/dL   Creatinine, Ser 1.01 (H) 0.44 - 1.00  mg/dL   Calcium 9.0 8.9 - 10.3 mg/dL   GFR calc non Af Amer 52 (L) >60 mL/min   GFR calc Af Amer >60 >60 mL/min   Anion gap 8 5 - 15    Comment: Performed at [facility], [place] [address], [place], [place] [address]  CBC WITH DIFFERENTIAL     Status: Abnormal   Collection Time: 09/28/18  1:39 PM  Result Value Ref Range   WBC 25.6 (H) 4.0 - 10.5 K/uL   RBC 4.69 3.87 - 5.11 MIL/uL   Hemoglobin 14.7 12.0 - 15.0 g/dL   HCT 46.8 (H) 36.0 - 46.0 %   MCV 99.8 80.0 - 100.0 fL   MCH 31.3 26.0 - 34.0 pg   MCHC 31.4 30.0 - 36.0 g/dL   RDW 12.6 11.5 - 15.5 %   Platelets 288 150 - 400 K/uL   nRBC 0.0 0.0 - 0.2 %   Neutrophils Relative % 91 %   Neutro Abs 23.4 (H) 1.7 - 7.7 K/uL   Lymphocytes Relative 4 %   Lymphs Abs 0.9 0.7 - 4.0 K/uL   Monocytes Relative 4 %   Monocytes Absolute 1.0 0.1 - 1.0 K/uL   Eosinophils Relative 0 %   Eosinophils Absolute  0.0 0.0 - 0.5 K/uL   Basophils Relative 0 %   Basophils Absolute 0.1 0.0 - 0.1 K/uL   WBC Morphology WHITE COUNT CONFIRMED ON SMEAR    Immature Granulocytes 1 %   Abs Immature Granulocytes 0.27 (H) 0.00 - 0.07 K/uL    Comment: Performed at [facility], [place] [address], [place], [place] [address]  Protime-INR     Status: None   Collection Time: 09/28/18  1:39 PM  Result Value Ref Range   Prothrombin Time 12.7 11.4 - 15.2 seconds   INR 1.0 0.8 - 1.2    Comment: (NOTE) INR goal varies based on device and disease states. Performed at [facility], [place] [address], [place], [place] [address]   Type and screen [place]     Status: None   Collection Time: 09/28/18  1:39 PM  Result Value Ref Range   ABO/RH(D) A NEG    Antibody Screen NEG    Sample Expiration      10/01/2018 Performed at [facility], [place] [address] [place], [place] [address]   Urinalysis, Routine w reflex microscopic     Status: Abnormal   Collection Time: 09/28/18  1:39 PM    Result Value Ref Range   Color, Urine YELLOW YELLOW   APPearance CLEAR CLEAR   Specific Gravity, Urine 1.019 1.005 - 1.030   pH 5.0 5.0 - 8.0   Glucose, UA NEGATIVE NEGATIVE mg/dL   Hgb urine dipstick NEGATIVE NEGATIVE   Bilirubin Urine NEGATIVE NEGATIVE   Ketones, ur 5 (A) NEGATIVE mg/dL   Protein, ur NEGATIVE NEGATIVE mg/dL   Nitrite NEGATIVE NEGATIVE   Leukocytes,Ua NEGATIVE NEGATIVE    Comment: Performed at [place] [address] [place], [place] [address]  ABO/Rh     Status: None (Preliminary result)   Collection Time: 09/28/18  1:41 PM  Result Value Ref Range   ABO/RH(D)      A NEG Performed at [facility], [place] [address], [place], [place] [address]   Brain natriuretic peptide     Status: Abnormal   Collection Time: 09/28/18  5:14 PM  Result Value Ref Range   B Natriuretic Peptide 158.5 (H) 0.0 - 100.0 pg/mL    Comment: Performed at [facility], [place] [address], [place], [place] [address]  Troponin I - ONCE - STAT     Status: Abnormal   Collection Time: 09/28/18  5:15 PM  Result Value Ref Range   Troponin I 0.03 (HH) <0.03 ng/mL    Comment: CRITICAL RESULT CALLED TO, READ BACK BY AND VERIFIED WITH: M.BRILL AT 1814 ON 09/28/18 BY N.THOMPSON Performed at [facility], [place] [address], [place], [place] [address]     Dg Chest 1 View  Result Date: 09/28/2018 CLINICAL DATA:  85 year old who fell this morning and injured the LEFT hip. Generalized weakness. Current history of Alzheimer's dementia. EXAM: CHEST  1 VIEW COMPARISON:  09/11/2016, 02/04/2007. FINDINGS: AP SEMI-ERECT view demonstrates borderline to mild cardiac enlargement, unchanged. Thoracic aorta tortuous and atherosclerotic. Hilar and mediastinal contours otherwise unremarkable. Stable linear scarring at the lung bases. Diffuse interstitial pulmonary opacities which are new since the most recent prior examination. No confluent airspace  consolidation. No visible pleural effusions. IMPRESSION: Acute mild CHF and/or fluid overload with interstitial pulmonary edema (favored over interstitial pneumonitis). Electronically Signed   By: Evangeline Dakin M.D.   On: 09/28/2018 13:24   Ct Hip Left  Wo Contrast  Result Date: 09/28/2018 CLINICAL DATA:  [place].  Left hip pain. EXAM: CT OF THE LEFT HIP WITHOUT CONTRAST TECHNIQUE: Multidetector CT imaging of the left hip was performed according to the standard protocol. Multiplanar CT image reconstructions were also generated. COMPARISON:  Radiographs 09/28/2018 FINDINGS: There is an impacted femoral neck fracture with very slight external rotation of the femoral head in relation to the femoral neck. Moderate hip joint degenerative changes. The acetabulum is intact. The visualized left hemipelvis is intact. The pubic symphysis and visualized left SI joint appear normal. No significant intrapelvic abnormalities are identified on the left side. No inguinal mass or hernia. IMPRESSION: 1. Impacted left femoral neck fracture. 2. Intact left bony hemipelvis. Electronically Signed   By: Marijo Sanes M.D.   On: 09/28/2018 15:31   Dg Hip Unilat With Pelvis 2-3 Views Left  Result Date: 09/28/2018 CLINICAL DATA:  85 year old who fell this morning and injured the LEFT hip. Initial encounter. EXAM: DG HIP (WITH OR WITHOUT PELVIS) 2-3V LEFT COMPARISON:  None. FINDINGS: The LEFT hip is internally rotated which I believe accounts for the foreshortening of the femoral neck, though an impacted intertrochanteric fracture could have a similar appearance. Joint space well-preserved for age. Bone mineral density relatively well preserved for age. Included AP pelvis demonstrates mild-to-moderate narrowing of the joint space in the contralateral RIGHT hip. Sacroiliac joints and symphysis pubis anatomically aligned without diastasis. Degenerative changes involving the visualized lower lumbar spine. IMPRESSION: Apparent  foreshortening of the LEFT femoral neck I believe is related to the fact that the LEFT hip is internally rotated, though an impacted intertrochanteric fracture could have a similar appearance. If there is strong clinical concern for fracture, CT may be helpful in further evaluation. Electronically Signed   By: Evangeline Dakin M.D.   On: 09/28/2018 13:29    Review of Systems  Unable to perform ROS: Dementia  Chart review used for review of systems as well as discussion with patient's husband is at the bedside. Blood pressure 117/65, pulse 83, temperature 100 F (37.8 C), temperature source Rectal, resp. rate (!) 23, SpO2 96 %. Physical Exam  Constitutional: No distress.  On oxygen no shortness of breath patient minimally conversant.  HENT:  Head: Normocephalic and atraumatic.  Eyes: Pupils are equal, round, and reactive to light. Conjunctivae are normal.  Neck: Normal range of motion. No tracheal deviation present. No thyromegaly present.  Cardiovascular: Normal rate.  Respiratory:  Sats in the 70 off oxygen.  No dyspnea, no wheezing.  GI: Soft. Bowel sounds are normal.  Musculoskeletal:        General: Tenderness present. No edema.     Comments: Pain with left hip range of motion skin over the left hip is normal distal pulses are intact sciatic function is intact.  Neurological:  Patient minimally conversant with dementia poor memory.  Skin: Skin is warm and dry. No rash noted. She is not diaphoretic. No erythema.  Psychiatric:  Alzheimer's.  Husband at bedside providing history.    Assessment/Plan: With patient's dementia difficulty following directions is unlikely that should be able to maintain nonweightbearing if her hip was pinned for 6 weeks.  We will proceed with hip hemiarthroplasty so that she can begin weightbearing postoperatively.  She has leukocytosis with white count 25,000 chest x-ray shows some pulmonary edema.  Does not appear to be UTI with normal UA.  Discussed plan  with husband who has power of attorney he understands and agrees to proceed with hemiarthroplasty tomorrow  at about 2:30 PM.  N.p.o. after midnight.  Kathryn [name] 09/28/2018, 8:14 PM

## 2018-09-28 NOTE — ED Notes (Signed)
[facility] called re: transfer.

## 2018-09-28 NOTE — ED Notes (Signed)
Pt's husband updated re: transfer.

## 2018-09-28 NOTE — ED Provider Notes (Signed)
[place] DEPT Provider Note   CSN: [id number] Arrival date & time: 09/28/18  1126    History   Chief Complaint Chief Complaint  Patient presents with  . Fall    HPI Kathryn [name] is a 85 y.o. female.  Patient presents by EMS after a witnessed fall today at home.  Level 5 caveat secondary to dementia.  History is given by husband.  Husband states she was turning and then fell to the ground landing on her left side.  She did not lose consciousness.  He was able to get her back up into a chair and she has been ambulating with a walker although with a limp.  He noted getting her back in bed but she would wince when he would move her left hip.  She has had a couple of falls in the last month or so and has been treated for urinary tract infection twice.  She has 2 pills left of her last antibiotics.     The history is provided by the patient.  Fall  This is a recurrent problem. The current episode started 3 to 5 hours ago. The problem has not changed since onset.Associated symptoms comments: l hip pain. The symptoms are aggravated by standing. The symptoms are relieved by position. She has tried nothing for the symptoms. The treatment provided no relief.    Past Medical History:  Diagnosis Date  . Alzheimer's dementia ([place])   . Anxiety   . Headache   . Hypertension     Patient Active Problem List   Diagnosis Date Noted  . Constipation 09/24/2018  . Generalized osteoarthritis of multiple sites 09/24/2018  . PAD (peripheral artery disease) ([place]) 07/20/2018  . HLD (hyperlipidemia) 06/15/2018  . Unstable gait 02/19/2018  . Aphasia 09/14/2017  . Headache 07/10/2016  . CKD (chronic kidney disease), stage III ([place]) 06/20/2016  . Dementia ([place]) 09/04/2015  . Generalized anxiety disorder 05/29/2015  . HTN (hypertension) 05/29/2015  . Memory loss 05/28/2015    Past Surgical History:  Procedure Laterality Date  . BREAST BIOPSY       OB History     No obstetric history on file.      Home Medications    Prior to Admission medications   Medication Sig Start Date End Date Taking? Authorizing Provider  aspirin EC 81 MG tablet Take 1 tablet (81 mg total) by mouth daily. 06/15/18   Melvenia Beam, MD  Cholecalciferol (VITAMIN D3) 2000 UNITS TABS Take 3,000 Units by mouth daily.     [provider]  ciprofloxacin (CIPRO) 250 MG tablet Take 1 tablet (250 mg total) by mouth 2 (two) times daily for 5 days. 09/24/18 09/29/18  [name], [name], MD  Coenzyme Q10 (COQ10 PO) Take by mouth.    [provider]  escitalopram (LEXAPRO) 10 MG tablet Take 10 mg by mouth daily. 08/28/18   [provider]  folic acid (FOLVITE) 062 MCG tablet Take 400 mcg by mouth daily.    [provider]  gabapentin (NEURONTIN) 100 MG capsule Take 1 capsule (100 mg total) by mouth 3 (three) times daily as needed. Patient taking differently: Take 100 mg by mouth 2 (two) times daily.  09/14/17   Melvenia Beam, MD  NON FORMULARY MK-7 90 meq    [provider]  Omega-3 Fatty Acids (FISH OIL) 1000 MG CAPS Take by mouth.    [provider]  Potassium Gluconate 550 MG TABS Take by mouth.  [provider]  pravastatin (PRAVACHOL) 10 MG tablet Take 1 tablet (10 mg total) by mouth daily. Patient not taking: Reported on 09/24/2018 08/23/18   [name], [name], MD  pyridoxine (B-6) 100 MG tablet Take 100 mg by mouth daily.    [provider]  rivastigmine (EXELON) 6 MG capsule TAKE 1 CAPSULE (6 MG TOTAL) 2 (TWO) TIMES DAILY BY MOUTH 06/08/18   Melvenia Beam, MD  zinc gluconate 50 MG tablet Take 50 mg by mouth daily.    [provider]    Family History Family History  Problem Relation Age of Onset  . Thyroid disease Mother   . Alzheimer's disease Mother   . Dementia Mother   . Hearing loss Brother   . Hearing loss Brother        also developing memory problems  . Heart Problems Brother      Social History Social History   Tobacco Use  . Smoking status: Former Smoker    Last attempt to quit: 1980    Years since quitting: 40.1  . Smokeless tobacco: Never Used  Substance Use Topics  . Alcohol use: Yes    Alcohol/week: 7.0 - 14.0 standard drinks    Types: 7 - 14 Glasses of wine per week    Comment: white wine  . Drug use: No     Allergies   Aricept [donepezil hcl] and Sulfonamide derivatives   Review of Systems Review of Systems  Unable to perform ROS: Dementia     Physical Exam Updated Vital Signs BP (!) 113/59 (BP Location: Left Arm)   Pulse 75   Temp 98 F (36.7 C) (Oral)   Resp 20   Ht 5\' 2"  (1.575 m)   Wt 56.3 kg   SpO2 97%   BMI 22.70 kg/m   Physical Exam Vitals signs and nursing note reviewed.  Constitutional:      General: She is not in acute distress.    Appearance: She is well-developed.  HENT:     Head: Normocephalic and atraumatic.  Eyes:     Conjunctiva/sclera: Conjunctivae normal.  Neck:     Musculoskeletal: Neck supple.  Cardiovascular:     Rate and Rhythm: Normal rate and regular rhythm.     Heart sounds: No murmur.  Pulmonary:     Effort: Pulmonary effort is normal. No respiratory distress.     Breath sounds: Normal breath sounds. No stridor. No wheezing.  Abdominal:     Palpations: Abdomen is soft.     Tenderness: There is no abdominal tenderness.  Musculoskeletal:        General: Tenderness present.     Right lower leg: No edema.     Left lower leg: No edema.     Comments: Full range of motion of upper extremities with no limitations.  Full range of motion of right lower extremity without any limitation.  Patient nontender at knee and ankle on the left.  She has some pain with internal and external rotation at left hip.  Skin:    General: Skin is warm and dry.     Capillary Refill: Capillary refill takes less than 2 seconds.  Neurological:     General: No focal deficit present.     Mental Status: She is alert.  Mental status is at baseline. She is disoriented.     GCS: GCS eye subscore is 4. GCS verbal subscore is 5. GCS motor subscore is 6.     Sensory: No sensory deficit.  Motor: No weakness.     Comments: Patient is oriented to self.  Baseline for her per her husband.  She is moving all 4 extremities.  Gait not tested secondary to concern for hip fracture.      ED Treatments / Results  Labs (all labs ordered are listed, but only abnormal results are displayed) Labs Reviewed  BASIC METABOLIC PANEL - Abnormal; Notable for the following components:      Result Value   Glucose, Bld 145 (*)    Creatinine, Ser 1.01 (*)    GFR calc non Af Amer 52 (*)    All other components within normal limits  CBC WITH DIFFERENTIAL/PLATELET - Abnormal; Notable for the following components:   WBC 25.6 (*)    HCT 46.8 (*)    Neutro Abs 23.4 (*)    Abs Immature Granulocytes 0.27 (*)    All other components within normal limits  URINALYSIS, ROUTINE W REFLEX MICROSCOPIC - Abnormal; Notable for the following components:   Ketones, ur 5 (*)    All other components within normal limits  TROPONIN I - Abnormal; Notable for the following components:   Troponin I 0.03 (*)    All other components within normal limits  BRAIN NATRIURETIC PEPTIDE - Abnormal; Notable for the following components:   B Natriuretic Peptide 158.5 (*)    All other components within normal limits  BASIC METABOLIC PANEL - Abnormal; Notable for the following components:   Glucose, Bld 125 (*)    Creatinine, Ser 1.24 (*)    Calcium 8.6 (*)    GFR calc non Af Amer 40 (*)    GFR calc Af Amer 47 (*)    All other components within normal limits  CBC - Abnormal; Notable for the following components:   WBC 14.6 (*)    All other components within normal limits  SURGICAL PCR SCREEN  URINE CULTURE  PROTIME-INR  TROPONIN I  TROPONIN I  TYPE AND SCREEN  ABO/RH  TYPE AND SCREEN  ABO/RH    EKG EKG Interpretation  Date/Time:  Tuesday  September 28 2018 14:13:21 EST Ventricular Rate:  87 PR Interval:    QRS Duration: 91 QT Interval:  368 QTC Calculation: 443 R Axis:   71 Text Interpretation:  Sinus rhythm Nonspecific repol abnormality, diffuse leads no prior to compare with Confirmed by Aletta Edouard [phone]) on 09/28/2018 2:22:32 PM   Radiology Dg Chest 1 View  Result Date: 09/28/2018 CLINICAL DATA:  85 year old who fell this morning and injured the LEFT hip. Generalized weakness. Current history of Alzheimer's dementia. EXAM: CHEST  1 VIEW COMPARISON:  09/11/2016, 02/04/2007. FINDINGS: AP SEMI-ERECT view demonstrates borderline to mild cardiac enlargement, unchanged. Thoracic aorta tortuous and atherosclerotic. Hilar and mediastinal contours otherwise unremarkable. Stable linear scarring at the lung bases. Diffuse interstitial pulmonary opacities which are new since the most recent prior examination. No confluent airspace consolidation. No visible pleural effusions. IMPRESSION: Acute mild CHF and/or fluid overload with interstitial pulmonary edema (favored over interstitial pneumonitis). Electronically Signed   By: Evangeline Dakin M.D.   On: 09/28/2018 13:24   Ct Hip Left Wo Contrast  Result Date: 09/28/2018 CLINICAL DATA:  [place].  Left hip pain. EXAM: CT OF THE LEFT HIP WITHOUT CONTRAST TECHNIQUE: Multidetector CT imaging of the left hip was performed according to the standard protocol. Multiplanar CT image reconstructions were also generated. COMPARISON:  Radiographs 09/28/2018 FINDINGS: There is an impacted femoral neck fracture with very slight external rotation of the femoral head in relation  to the femoral neck. Moderate hip joint degenerative changes. The acetabulum is intact. The visualized left hemipelvis is intact. The pubic symphysis and visualized left SI joint appear normal. No significant intrapelvic abnormalities are identified on the left side. No inguinal mass or hernia. IMPRESSION: 1. Impacted left femoral neck  fracture. 2. Intact left bony hemipelvis. Electronically Signed   By: Marijo Sanes M.D.   On: 09/28/2018 15:31   Dg Hip Unilat With Pelvis 2-3 Views Left  Result Date: 09/28/2018 CLINICAL DATA:  85 year old who fell this morning and injured the LEFT hip. Initial encounter. EXAM: DG HIP (WITH OR WITHOUT PELVIS) 2-3V LEFT COMPARISON:  None. FINDINGS: The LEFT hip is internally rotated which I believe accounts for the foreshortening of the femoral neck, though an impacted intertrochanteric fracture could have a similar appearance. Joint space well-preserved for age. Bone mineral density relatively well preserved for age. Included AP pelvis demonstrates mild-to-moderate narrowing of the joint space in the contralateral RIGHT hip. Sacroiliac joints and symphysis pubis anatomically aligned without diastasis. Degenerative changes involving the visualized lower lumbar spine. IMPRESSION: Apparent foreshortening of the LEFT femoral neck I believe is related to the fact that the LEFT hip is internally rotated, though an impacted intertrochanteric fracture could have a similar appearance. If there is strong clinical concern for fracture, CT may be helpful in further evaluation. Electronically Signed   By: Evangeline Dakin M.D.   On: 09/28/2018 13:29    Procedures Procedures (including critical care time)  Medications Ordered in ED Medications - No data to display   Initial Impression / Assessment and Plan / ED Course  I have reviewed the triage vital signs and the nursing notes.  Pertinent labs & imaging results that were available during my care of the patient were reviewed by me and considered in my medical decision making (see chart for details).  Clinical Course as of Sep 29 754  Tue Sep 28, 2018  1409 Was asked by the nurse to come evaluate the patient because she was regurgitating something that looked dark.  Her husband states she had blueberry for breakfast so potentially was that.  She does have  an elevated white count but her hemoglobin is higher than normal so likely she is dry.   [MB]  7741 Discussed with Dr. Lorin Mercy from orthopedics.  He is recommending that the patient be admitted to [facility] under medical service, n.p.o. after midnight and he anticipates operating on her tomorrow afternoon.   [MB]  2878 Discussed with Dr. [place] Pink from the hospitalist service who will evaluate the patient for admission.  He will follow-up on the urinalysis and making decision whether she needs some diuresis.   [MB]    Clinical Course User Index [MB] Hayden Rasmussen, MD        Final Clinical Impressions(s) / ED Diagnoses   Final diagnoses:  Closed left hip fracture, initial encounter ([place])  Leukocytosis, unspecified type    ED Discharge Orders    None       Hayden Rasmussen, MD 09/29/18 [phone]

## 2018-09-28 NOTE — ED Notes (Signed)
Report called to [id number]. 

## 2018-09-28 NOTE — ED Triage Notes (Signed)
Pt arrived via EMS from home. Per pt husband pt fell today and was witnessed by husband denies that she had LOC, and any injuries to head. Pt c/o left hip pain post fall approx. An hour. Pt husband reports that pt had a recent UTI, and has been on ABX., and has caused pt to have increased weakness.   Pt has HX of Dementia.         V/s 122/62, HR 72, RR 16, 95%RA.

## 2018-09-28 NOTE — ED Notes (Signed)
Bed: AY84 Expected date:  Expected time:  Means of arrival:  Comments: EKG and UA for C

## 2018-09-28 NOTE — H&P (View-Only) (Signed)
Reason for Consult: Impacted closed left femoral neck fracture Referring Physician: Bonnielee Haff MD  Kathryn [name] is an 85 y.o. female.  HPI: 85 year old female with Alzheimer's whose husband is her care provider and is at the bedside.  Patient was standing cord to the husband and she turned and fell on her left side losing her balance.  Patient due to her dementia is not able to provide history.  On admission white count was 25,000 with hypoxia saturations in the 70s prior to oxygen and chest x-ray showing some pulmonary edema.  Past Medical History:  Diagnosis Date  . Alzheimer's dementia ([place])   . Anxiety   . Headache   . Hypertension     Past Surgical History:  Procedure Laterality Date  . BREAST BIOPSY      Family History  Problem Relation Age of Onset  . Thyroid disease Mother   . Alzheimer's disease Mother   . Dementia Mother   . Hearing loss Brother   . Hearing loss Brother        also developing memory problems  . Heart Problems Brother     Social History:  reports that she quit smoking about 40 years ago. She has never used smokeless tobacco. She reports current alcohol use of about 7.0 - 14.0 standard drinks of alcohol per week. She reports that she does not use drugs.  Allergies:  Allergies  Allergen Reactions  . Aricept [Donepezil Hcl] Other (See Comments)    "giving stomach and digestive problems"  . Sulfonamide Derivatives     REACTION: Swollen joints    Medications: I have reviewed the patient's current medications.  Results for orders placed or performed during the hospital encounter of 09/28/18 (from the past 48 hour(s))  Basic metabolic panel     Status: Abnormal   Collection Time: 09/28/18  1:39 PM  Result Value Ref Range   Sodium 141 135 - 145 mmol/L   Potassium 3.9 3.5 - 5.1 mmol/L   Chloride 105 98 - 111 mmol/L   CO2 28 22 - 32 mmol/L   Glucose, Bld 145 (H) 70 - 99 mg/dL   BUN 23 8 - 23 mg/dL   Creatinine, Ser 1.01 (H) 0.44 - 1.00  mg/dL   Calcium 9.0 8.9 - 10.3 mg/dL   GFR calc non Af Amer 52 (L) >60 mL/min   GFR calc Af Amer >60 >60 mL/min   Anion gap 8 5 - 15    Comment: Performed at [facility], [place] [address], [place], [place] [address]  CBC WITH DIFFERENTIAL     Status: Abnormal   Collection Time: 09/28/18  1:39 PM  Result Value Ref Range   WBC 25.6 (H) 4.0 - 10.5 K/uL   RBC 4.69 3.87 - 5.11 MIL/uL   Hemoglobin 14.7 12.0 - 15.0 g/dL   HCT 46.8 (H) 36.0 - 46.0 %   MCV 99.8 80.0 - 100.0 fL   MCH 31.3 26.0 - 34.0 pg   MCHC 31.4 30.0 - 36.0 g/dL   RDW 12.6 11.5 - 15.5 %   Platelets 288 150 - 400 K/uL   nRBC 0.0 0.0 - 0.2 %   Neutrophils Relative % 91 %   Neutro Abs 23.4 (H) 1.7 - 7.7 K/uL   Lymphocytes Relative 4 %   Lymphs Abs 0.9 0.7 - 4.0 K/uL   Monocytes Relative 4 %   Monocytes Absolute 1.0 0.1 - 1.0 K/uL   Eosinophils Relative 0 %   Eosinophils Absolute  0.0 0.0 - 0.5 K/uL   Basophils Relative 0 %   Basophils Absolute 0.1 0.0 - 0.1 K/uL   WBC Morphology WHITE COUNT CONFIRMED ON SMEAR    Immature Granulocytes 1 %   Abs Immature Granulocytes 0.27 (H) 0.00 - 0.07 K/uL    Comment: Performed at [facility], [place] [address] [place], [place] [address]  Protime-INR     Status: None   Collection Time: 09/28/18  1:39 PM  Result Value Ref Range   Prothrombin Time 12.7 11.4 - 15.2 seconds   INR 1.0 0.8 - 1.2    Comment: (NOTE) INR goal varies based on device and disease states. Performed at [facility], [place] [address], [place], [place] [address]   Type and screen [place]     Status: None   Collection Time: 09/28/18  1:39 PM  Result Value Ref Range   ABO/RH(D) A NEG    Antibody Screen NEG    Sample Expiration      10/01/2018 Performed at [facility], [place] [address], [place], [place] [address]   Urinalysis, Routine w reflex microscopic     Status: Abnormal   Collection Time: 09/28/18  1:39 PM    Result Value Ref Range   Color, Urine YELLOW YELLOW   APPearance CLEAR CLEAR   Specific Gravity, Urine 1.019 1.005 - 1.030   pH 5.0 5.0 - 8.0   Glucose, UA NEGATIVE NEGATIVE mg/dL   Hgb urine dipstick NEGATIVE NEGATIVE   Bilirubin Urine NEGATIVE NEGATIVE   Ketones, ur 5 (A) NEGATIVE mg/dL   Protein, ur NEGATIVE NEGATIVE mg/dL   Nitrite NEGATIVE NEGATIVE   Leukocytes,Ua NEGATIVE NEGATIVE    Comment: Performed at [place] [address], [place], [place] [address]  ABO/Rh     Status: None (Preliminary result)   Collection Time: 09/28/18  1:41 PM  Result Value Ref Range   ABO/RH(D)      A NEG Performed at [facility], [place] [address] [place], [place] [address]   Brain natriuretic peptide     Status: Abnormal   Collection Time: 09/28/18  5:14 PM  Result Value Ref Range   B Natriuretic Peptide 158.5 (H) 0.0 - 100.0 pg/mL    Comment: Performed at [facility], [place] [address] [place], [place] [address]  Troponin I - ONCE - STAT     Status: Abnormal   Collection Time: 09/28/18  5:15 PM  Result Value Ref Range   Troponin I 0.03 (HH) <0.03 ng/mL    Comment: CRITICAL RESULT CALLED TO, READ BACK BY AND VERIFIED WITH: M.BRILL AT 1814 ON 09/28/18 BY N.THOMPSON Performed at [facility], [place] [address], [place], [place] [address]     Dg Chest 1 View  Result Date: 09/28/2018 CLINICAL DATA:  85 year old who fell this morning and injured the LEFT hip. Generalized weakness. Current history of Alzheimer's dementia. EXAM: CHEST  1 VIEW COMPARISON:  09/11/2016, 02/04/2007. FINDINGS: AP SEMI-ERECT view demonstrates borderline to mild cardiac enlargement, unchanged. Thoracic aorta tortuous and atherosclerotic. Hilar and mediastinal contours otherwise unremarkable. Stable linear scarring at the lung bases. Diffuse interstitial pulmonary opacities which are new since the most recent prior examination. No confluent airspace  consolidation. No visible pleural effusions. IMPRESSION: Acute mild CHF and/or fluid overload with interstitial pulmonary edema (favored over interstitial pneumonitis). Electronically Signed   By: Evangeline Dakin M.D.   On: 09/28/2018 13:24   Ct Hip Left  Wo Contrast  Result Date: 09/28/2018 CLINICAL DATA:  [place].  Left hip pain. EXAM: CT OF THE LEFT HIP WITHOUT CONTRAST TECHNIQUE: Multidetector CT imaging of the left hip was performed according to the standard protocol. Multiplanar CT image reconstructions were also generated. COMPARISON:  Radiographs 09/28/2018 FINDINGS: There is an impacted femoral neck fracture with very slight external rotation of the femoral head in relation to the femoral neck. Moderate hip joint degenerative changes. The acetabulum is intact. The visualized left hemipelvis is intact. The pubic symphysis and visualized left SI joint appear normal. No significant intrapelvic abnormalities are identified on the left side. No inguinal mass or hernia. IMPRESSION: 1. Impacted left femoral neck fracture. 2. Intact left bony hemipelvis. Electronically Signed   By: Marijo Sanes M.D.   On: 09/28/2018 15:31   Dg Hip Unilat With Pelvis 2-3 Views Left  Result Date: 09/28/2018 CLINICAL DATA:  85 year old who fell this morning and injured the LEFT hip. Initial encounter. EXAM: DG HIP (WITH OR WITHOUT PELVIS) 2-3V LEFT COMPARISON:  None. FINDINGS: The LEFT hip is internally rotated which I believe accounts for the foreshortening of the femoral neck, though an impacted intertrochanteric fracture could have a similar appearance. Joint space well-preserved for age. Bone mineral density relatively well preserved for age. Included AP pelvis demonstrates mild-to-moderate narrowing of the joint space in the contralateral RIGHT hip. Sacroiliac joints and symphysis pubis anatomically aligned without diastasis. Degenerative changes involving the visualized lower lumbar spine. IMPRESSION: Apparent  foreshortening of the LEFT femoral neck I believe is related to the fact that the LEFT hip is internally rotated, though an impacted intertrochanteric fracture could have a similar appearance. If there is strong clinical concern for fracture, CT may be helpful in further evaluation. Electronically Signed   By: Evangeline Dakin M.D.   On: 09/28/2018 13:29    Review of Systems  Unable to perform ROS: Dementia  Chart review used for review of systems as well as discussion with patient's husband is at the bedside. Blood pressure 117/65, pulse 83, temperature 100 F (37.8 C), temperature source Rectal, resp. rate (!) 23, SpO2 96 %. Physical Exam  Constitutional: No distress.  On oxygen no shortness of breath patient minimally conversant.  HENT:  Head: Normocephalic and atraumatic.  Eyes: Pupils are equal, round, and reactive to light. Conjunctivae are normal.  Neck: Normal range of motion. No tracheal deviation present. No thyromegaly present.  Cardiovascular: Normal rate.  Respiratory:  Sats in the 70 off oxygen.  No dyspnea, no wheezing.  GI: Soft. Bowel sounds are normal.  Musculoskeletal:        General: Tenderness present. No edema.     Comments: Pain with left hip range of motion skin over the left hip is normal distal pulses are intact sciatic function is intact.  Neurological:  Patient minimally conversant with dementia poor memory.  Skin: Skin is warm and dry. No rash noted. She is not diaphoretic. No erythema.  Psychiatric:  Alzheimer's.  Husband at bedside providing history.    Assessment/Plan: With patient's dementia difficulty following directions is unlikely that should be able to maintain nonweightbearing if her hip was pinned for 6 weeks.  We will proceed with hip hemiarthroplasty so that she can begin weightbearing postoperatively.  She has leukocytosis with white count 25,000 chest x-ray shows some pulmonary edema.  Does not appear to be UTI with normal UA.  Discussed plan  with husband who has power of attorney he understands and agrees to proceed with hemiarthroplasty tomorrow  at about 2:30 PM.  N.p.o. after midnight.  Marybelle Killings 09/28/2018, 8:14 PM

## 2018-09-28 NOTE — H&P (Addendum)
[facility] History and Physical  Kathryn [name] WFU:[id number] DOB: 1934/03/18 DOA: 09/28/2018   PCP: [name], [name], MD  Specialists: Dr. [name] is her neurologist  Chief Complaint: Fall  HPI: Kathryn [name] is a 85 y.o. female with a past medical history of dementia, recent UTI, depression and anxiety who lives with her husband.  Patient unable to provide any history due to her dementia.  According to the husband patient was standing and then she turned and then fell on her left side.  She apparently lost her balance.  No syncopal episode.  She was able to get up with assistance and go to the bed.  At that time deformity was noted on the left side.  Patient was brought into the hospital.  Patient currently denies any pain in the left hip area.  She denies any shortness of breath.  No chest pain currently.  No fever chills.  No nausea or vomiting recently.  Not very communicative at this time.  Possibly due to dementia.  No history of head injuries.  In the emergency department she was noted to be hypoxic with saturations in the 70s initially.  She was placed on oxygen with improvement in oxygen saturations.  X-ray showed left hip fracture.  Orthopedics was consulted.  It was requested that the patient be transferred to [facility].  However she was also found to have pulmonary edema on chest x-ray.  Home Medications: Prior to Admission medications   Medication Sig Start Date End Date Taking? Authorizing Provider  aspirin EC 81 MG tablet Take 1 tablet (81 mg total) by mouth daily. 06/15/18  Yes Melvenia Beam, MD  Cholecalciferol (VITAMIN D3) 2000 UNITS TABS Take 3,000 Units by mouth daily.    Yes [provider]  ciprofloxacin (CIPRO) 250 MG tablet Take 1 tablet (250 mg total) by mouth 2 (two) times daily for 5 days. 09/24/18 09/29/18 Yes [name], [name], MD  escitalopram (LEXAPRO) 10 MG tablet Take 10 mg by mouth daily. 08/28/18  Yes [provider]  folic  acid (FOLVITE) 202 MCG tablet Take 400 mcg by mouth daily.   Yes [provider]  gabapentin (NEURONTIN) 100 MG capsule Take 1 capsule (100 mg total) by mouth 3 (three) times daily as needed. Patient taking differently: Take 100 mg by mouth 2 (two) times daily as needed (nerve pain).  09/14/17  Yes Melvenia Beam, MD  NON FORMULARY MK-7 90 meq   Yes [provider]  pravastatin (PRAVACHOL) 10 MG tablet Take 1 tablet (10 mg total) by mouth daily. 08/23/18  Yes [name], [name], MD  pyridoxine (B-6) 100 MG tablet Take 100 mg by mouth daily.   Yes [provider]  rivastigmine (EXELON) 6 MG capsule TAKE 1 CAPSULE (6 MG TOTAL) 2 (TWO) TIMES DAILY BY MOUTH 06/08/18  Yes Melvenia Beam, MD  zinc gluconate 50 MG tablet Take 50 mg by mouth daily.   Yes [provider]  Coenzyme Q10 (COQ10 PO) Take by mouth.    [provider]    Allergies:  Allergies  Allergen Reactions  . Aricept [Donepezil Hcl] Other (See Comments)    "giving stomach and digestive problems"  . Sulfonamide Derivatives     REACTION: Swollen joints    Past Medical History: Past Medical History:  Diagnosis Date  . Alzheimer's dementia ([place])   . Anxiety   . Headache   . Hypertension     Past Surgical History:  Procedure Laterality  Date  . BREAST BIOPSY      Social History: Lives with her husband.  No history of smoking currently.  Was consuming 2 glasses of wine every night till a few weeks ago when she quit.   Family History:  Family History  Problem Relation Age of Onset  . Thyroid disease Mother   . Alzheimer's disease Mother   . Dementia Mother   . Hearing loss Brother   . Hearing loss Brother        also developing memory problems  . Heart Problems Brother      Review of Systems -unable to do due to her dementia  Physical Examination  Vitals:   09/28/18 1418 09/28/18 1421 09/28/18 1422 09/28/18 1427  BP:    (!) 136/57  Pulse: 80 75 73 78  Resp: (!) 22  (!) 24  (!) 25  Temp:   100 F (37.8 C)   TempSrc:   Rectal   SpO2: (!) 86% 96%  97%    BP (!) 136/57   Pulse 78   Temp 100 F (37.8 C) (Rectal)   Resp (!) 25   SpO2 97%   General appearance: alert, cooperative, appears stated age, distracted and no distress Head: Normocephalic, without obvious abnormality, atraumatic Eyes: conjunctivae/corneas clear. PERRL, EOM's intact.. Throat: lips, mucosa, and tongue normal; teeth and gums normal Neck: no adenopathy, no carotid bruit, no JVD, supple, symmetrical, trachea midline and thyroid not enlarged, symmetric, no tenderness/mass/nodules Resp: Mildly tachypneic.  No use of accessory muscles.  Few crackles at the bases.  No wheezing or rhonchi. Cardio: regular rate and rhythm, S1, S2 normal, no murmur, click, rub or gallop GI: soft, non-tender; bowel sounds normal; no masses,  no organomegaly Extremities: extremities normal, atraumatic, no cyanosis or edema Pulses: 2+ and symmetric Skin: Skin color, texture, turgor normal. No rashes or lesions Lymph nodes: Cervical, supraclavicular, and axillary nodes normal. Neurologic: No obvious focal neurological deficits.  Patient is oriented to person.   Labs on Admission: I have personally reviewed following labs and imaging studies  CBC: Recent Labs  Lab 09/28/18 1339  WBC 25.6*  NEUTROABS 23.4*  HGB 14.7  HCT 46.8*  MCV 99.8  PLT 016   Basic Metabolic Panel: Recent Labs  Lab 09/28/18 1339  NA 141  K 3.9  CL 105  CO2 28  GLUCOSE 145*  BUN 23  CREATININE 1.01*  CALCIUM 9.0   GFR: Estimated Creatinine Clearance: 34 mL/min (A) (by C-G formula based on SCr of 1.01 mg/dL (H)).  Coagulation Profile: Recent Labs  Lab 09/28/18 1339  INR 1.0     Radiological Exams on Admission: Dg Chest 1 View  Result Date: 09/28/2018 CLINICAL DATA:  85 year old who fell this morning and injured the LEFT hip. Generalized weakness. Current history of Alzheimer's dementia. EXAM: CHEST  1 VIEW  COMPARISON:  09/11/2016, 02/04/2007. FINDINGS: AP SEMI-ERECT view demonstrates borderline to mild cardiac enlargement, unchanged. Thoracic aorta tortuous and atherosclerotic. Hilar and mediastinal contours otherwise unremarkable. Stable linear scarring at the lung bases. Diffuse interstitial pulmonary opacities which are new since the most recent prior examination. No confluent airspace consolidation. No visible pleural effusions. IMPRESSION: Acute mild CHF and/or fluid overload with interstitial pulmonary edema (favored over interstitial pneumonitis). Electronically Signed   By: Evangeline Dakin M.D.   On: 09/28/2018 13:24   Ct Hip Left Wo Contrast  Result Date: 09/28/2018 CLINICAL DATA:  [place].  Left hip pain. EXAM: CT OF THE LEFT HIP WITHOUT CONTRAST TECHNIQUE: Multidetector CT  imaging of the left hip was performed according to the standard protocol. Multiplanar CT image reconstructions were also generated. COMPARISON:  Radiographs 09/28/2018 FINDINGS: There is an impacted femoral neck fracture with very slight external rotation of the femoral head in relation to the femoral neck. Moderate hip joint degenerative changes. The acetabulum is intact. The visualized left hemipelvis is intact. The pubic symphysis and visualized left SI joint appear normal. No significant intrapelvic abnormalities are identified on the left side. No inguinal mass or hernia. IMPRESSION: 1. Impacted left femoral neck fracture. 2. Intact left bony hemipelvis. Electronically Signed   By: Marijo Sanes M.D.   On: 09/28/2018 15:31   Dg Hip Unilat With Pelvis 2-3 Views Left  Result Date: 09/28/2018 CLINICAL DATA:  85 year old who fell this morning and injured the LEFT hip. Initial encounter. EXAM: DG HIP (WITH OR WITHOUT PELVIS) 2-3V LEFT COMPARISON:  None. FINDINGS: The LEFT hip is internally rotated which I believe accounts for the foreshortening of the femoral neck, though an impacted intertrochanteric fracture could have a similar  appearance. Joint space well-preserved for age. Bone mineral density relatively well preserved for age. Included AP pelvis demonstrates mild-to-moderate narrowing of the joint space in the contralateral RIGHT hip. Sacroiliac joints and symphysis pubis anatomically aligned without diastasis. Degenerative changes involving the visualized lower lumbar spine. IMPRESSION: Apparent foreshortening of the LEFT femoral neck I believe is related to the fact that the LEFT hip is internally rotated, though an impacted intertrochanteric fracture could have a similar appearance. If there is strong clinical concern for fracture, CT may be helpful in further evaluation. Electronically Signed   By: Evangeline Dakin M.D.   On: 09/28/2018 13:29    My interpretation of Electrocardiogram: Sinus rhythm in the 80s.  Normal axis.  Poor quality EKG with baseline wander.  There could be subtle ST depression in the inferior leads although it is very hard to say.   Problem List  Principal Problem:   Hip fracture ([place]) Active Problems:   Dementia ([place])   CKD (chronic kidney disease), stage III (HCC)   PAD (peripheral artery disease) (HCC)   Acute pulmonary edema (HCC)   Acute on chronic respiratory failure with hypoxia (HCC)   Assessment: This is a 85 year old Caucasian female with past medical history as stated earlier who presents after mechanical fall and is found to have a left hip fracture.  She will need surgery.  However at the same time she is found to have acute pulmonary edema on chest x-ray.  She was hypoxic with saturations in the 70s.  No known history of heart disease.  Plan:  1. Acute pulmonary edema/Acute respiratory failure with hypoxia: She was given 500 mL bolus of IV fluids in the ED.  It is unclear if she was hypoxic even before the bolus was initiated.  Based on the charting of the vital signs it does seem she was hypoxic even before she got the fluid bolus.  Patient and husband deny any history of  heart disease.  EKG shows nonspecific changes.  Patient denies any chest pain.  We will give her a dose of her Lasix as that she is noted to be dyspneic.  We will do an echocardiogram as soon as possible.  This will need to be done before she can undergo surgery.  Check troponin.  EKG needs to be repeated. Check a BMP as well.  2.  Left hip fracture: This was a result of mechanical fall.  Hip fracture protocol.  Pain  control.  Patient with acute pulmonary edema on chest x-ray.  Nonspecific EKG changes.  No known history of heart disease.  She needs further work-up before she can undergo surgery.  This was discussed with the patient's husband.  Echocardiogram has been ordered.  Repeat EKG.  Troponin level.  We will determine need for further testing based on the results of the above-mentioned test.  3.  Recent E coli UTI: Recently completed 2 courses of antibiotics.  Unknown if the patient is still experiencing symptoms.  She is does not really very communicative.  UA is pending.  4.  Leukocytosis: Temperature was 100F.  No obvious source of infection is noted.  It is not felt that the findings on the chest x-ray was reflective of an infection.   UA is pending. She recently completed 2 courses of antibiotics as discussed above for UTI.  Elevated WBC and fever could be reactive from her injury.  Hold off on antibiotics for now.  5. History of dementia: Continue home medications.  6. Chronic kidney disease stage III: Creatinine seems to be at baseline.  Monitor urine output.  Foley catheter to be placed due to hip fracture.  7.  Peripheral artery disease: Noted to be on aspirin and statin.  DVT Prophylaxis: SCDs Code Status: Full code Family Communication: Discussed with the patient's husband Disposition: Unknown.  Will likely need to go to skilled nursing facility. Consults called: Orthopedics, Dr. Lorin Mercy Admission Status: Inpatient  Severity of Illness: The appropriate patient status for this  patient is INPATIENT. Inpatient status is judged to be reasonable and necessary in order to provide the required intensity of service to ensure the patient's safety. The patient's presenting symptoms, physical exam findings, and initial radiographic and laboratory data in the context of their chronic comorbidities is felt to place them at high risk for further clinical deterioration. Furthermore, it is not anticipated that the patient will be medically stable for discharge from the hospital within 2 midnights of admission. The following factors support the patient status of inpatient.   " The patient's presenting symptoms include left hip pain. " The worrisome physical exam findings include crackles in the lung. " The initial radiographic and laboratory data are worrisome because of pulmonary edema. " The chronic co-morbidities include dementia.   * I certify that at the point of admission it is my clinical judgment that the patient will require inpatient hospital care spanning beyond 2 midnights from the point of admission due to high intensity of service, high risk for further deterioration and high frequency of surveillance required.*  Further management decisions will depend on results of further testing and patient's response to treatment.   Kathryn [name]  [facility] operational officer on [email or web address]  09/28/2018, 4:54 PM

## 2018-09-28 NOTE — ED Notes (Signed)
Bed: [facility] Expected date:  Expected time:  Means of arrival:  Comments: EMS 84yo fall , hip pain , no shortening/rotation

## 2018-09-28 NOTE — ED Notes (Signed)
Pt's husband states he is going home to get a few things and requesting that he be called at [phone] when [facility] arrives to transport the pt.

## 2018-09-28 NOTE — ED Notes (Signed)
[facility] called to get report. ETA 15-20 mins to pick up pt.

## 2018-09-28 NOTE — Progress Notes (Signed)
Pt laying flat in bed when I went in room to do nursing admission history. She started actively heaving. I raised head of bed up a small amount. I notified charge rn Joseph Art RN to see if patient could be raised up further so she would not aspirate. Head of bed was then raised up. Pt did sound slightly wheezy after being sat up.  Lucius Conn BSN, RN-BC Admissions RN  09/28/2018 4:24 PM

## 2018-09-28 NOTE — ED Notes (Signed)
Date and time results received: 09/28/18 6:14 PM  (use smartphrase ".now" to insert current time)  Test: Troponin Critical Value: 0.03  Name of Provider Notified: [place] Pink  Orders Received? Or Actions Taken?: awaiting orders

## 2018-09-28 NOTE — ED Notes (Signed)
Placed pt on 4 lpm via Nasal cannula, as pt was having low saturations 78-83% on RA. Pt saturations have increased above 94%

## 2018-09-28 NOTE — ED Triage Notes (Signed)
No abnormalities are noted to hip area palpated no facial grimace, or pain is reported by patient.

## 2018-09-28 NOTE — ED Notes (Signed)
Pt's husband called and updated with [facility] Transfer timeline.

## 2018-09-29 ENCOUNTER — Inpatient Hospital Stay (HOSPITAL_COMMUNITY): Payer: PPO | Admitting: Anesthesiology

## 2018-09-29 ENCOUNTER — Telehealth: Payer: Self-pay | Admitting: Family Medicine

## 2018-09-29 ENCOUNTER — Inpatient Hospital Stay (HOSPITAL_COMMUNITY): Payer: PPO

## 2018-09-29 ENCOUNTER — Encounter (HOSPITAL_COMMUNITY)
Admission: EM | Disposition: A | Discharge status: Skilled Nursing Facility | Payer: Self-pay | Source: Home / Self Care | Attending: Family Medicine

## 2018-09-29 ENCOUNTER — Encounter (HOSPITAL_COMMUNITY): Payer: Self-pay | Admitting: Anesthesiology

## 2018-09-29 ENCOUNTER — Other Ambulatory Visit (HOSPITAL_COMMUNITY): Payer: PPO

## 2018-09-29 ENCOUNTER — Other Ambulatory Visit: Payer: Self-pay

## 2018-09-29 DIAGNOSIS — Z01810 Encounter for preprocedural cardiovascular examination: Secondary | ICD-10-CM

## 2018-09-29 HISTORY — PX: HIP ARTHROPLASTY: SHX981

## 2018-09-29 LAB — CBC
HCT: 41.7 % (ref 36.0–46.0)
Hemoglobin: 13.3 g/dL (ref 12.0–15.0)
MCH: 30.6 pg (ref 26.0–34.0)
MCHC: 31.9 g/dL (ref 30.0–36.0)
MCV: 95.9 fL (ref 80.0–100.0)
Platelets: 245 10*3/uL (ref 150–400)
RBC: 4.35 MIL/uL (ref 3.87–5.11)
RDW: 12.7 % (ref 11.5–15.5)
WBC: 14.6 10*3/uL — ABNORMAL HIGH (ref 4.0–10.5)
nRBC: 0 % (ref 0.0–0.2)

## 2018-09-29 LAB — BASIC METABOLIC PANEL
Anion gap: 12 (ref 5–15)
BUN: 20 mg/dL (ref 8–23)
CO2: 27 mmol/L (ref 22–32)
Calcium: 8.6 mg/dL — ABNORMAL LOW (ref 8.9–10.3)
Chloride: 102 mmol/L (ref 98–111)
Creatinine, Ser: 1.24 mg/dL — ABNORMAL HIGH (ref 0.44–1.00)
GFR calc Af Amer: 47 mL/min — ABNORMAL LOW (ref >60)
GFR calc non Af Amer: 40 mL/min — ABNORMAL LOW (ref >60)
Glucose, Bld: 125 mg/dL — ABNORMAL HIGH (ref 70–99)
Potassium: 3.9 mmol/L (ref 3.5–5.1)
Sodium: 141 mmol/L (ref 135–145)

## 2018-09-29 LAB — SURGICAL PCR SCREEN
MRSA, PCR: NEGATIVE
Staphylococcus aureus: NEGATIVE

## 2018-09-29 LAB — ABO/RH
ABO/RH(D): A NEG
ABO/RH(D): A NEG

## 2018-09-29 LAB — TYPE AND SCREEN
ABO/RH(D): A NEG
ABO/RH(D): A NEG
Antibody Screen: NEGATIVE
Antibody Screen: NEGATIVE

## 2018-09-29 LAB — ECHOCARDIOGRAM COMPLETE
Height: 62 in
Weight: 1985.9 oz

## 2018-09-29 LAB — TROPONIN I
Troponin I: <0.03 ng/mL (ref <0.03)
Troponin I: <0.03 ng/mL (ref <0.03)

## 2018-09-29 SURGERY — HEMIARTHROPLASTY, HIP, DIRECT ANTERIOR APPROACH, FOR FRACTURE
Anesthesia: Monitor Anesthesia Care | Site: Hip | Laterality: Left

## 2018-09-29 MED ORDER — ASPIRIN EC 325 MG PO TBEC
325.0000 mg | DELAYED_RELEASE_TABLET | Freq: Every day | ORAL | Status: DC
  Administered 2018-09-30 – 2018-10-06 (×7): 325 mg via ORAL
  Filled 2018-09-29 (×7): qty 1

## 2018-09-29 MED ORDER — PROPOFOL 500 MG/50ML IV EMUL
INTRAVENOUS | Status: DC | PRN
  Administered 2018-09-29: 20 ug via INTRAVENOUS

## 2018-09-29 MED ORDER — PROPOFOL 10 MG/ML IV BOLUS
INTRAVENOUS | Status: AC
  Filled 2018-09-29: qty 20

## 2018-09-29 MED ORDER — PHENYLEPHRINE HCL 10 MG/ML IJ SOLN
INTRAMUSCULAR | Status: DC | PRN
  Administered 2018-09-29 (×3): 80 ug via INTRAVENOUS
  Administered 2018-09-29: 40 ug via INTRAVENOUS
  Administered 2018-09-29: 80 ug via INTRAVENOUS

## 2018-09-29 MED ORDER — PROPOFOL 500 MG/50ML IV EMUL
INTRAVENOUS | Status: DC | PRN
  Administered 2018-09-29: 20 ug/kg/min via INTRAVENOUS

## 2018-09-29 MED ORDER — BISACODYL 10 MG RE SUPP: 10.0000 mg | Freq: Every day | RECTAL | Status: DC | PRN

## 2018-09-29 MED ORDER — LACTATED RINGERS IV SOLN
INTRAVENOUS | Status: DC
  Administered 2018-09-29: 14:00:00 via INTRAVENOUS

## 2018-09-29 MED ORDER — MORPHINE SULFATE (PF) 2 MG/ML IV SOLN: 0.5000 mg | INTRAVENOUS | Status: DC | PRN

## 2018-09-29 MED ORDER — 0.9 % SODIUM CHLORIDE (POUR BTL) OPTIME
TOPICAL | Status: DC | PRN
  Administered 2018-09-29: 1000 mL

## 2018-09-29 MED ORDER — FENTANYL CITRATE (PF) 100 MCG/2ML IJ SOLN
INTRAMUSCULAR | Status: DC | PRN
  Administered 2018-09-29 (×2): 25 ug via INTRAVENOUS

## 2018-09-29 MED ORDER — MENTHOL 3 MG MT LOZG: 1.0000 | LOZENGE | OROMUCOSAL | Status: DC | PRN

## 2018-09-29 MED ORDER — ONDANSETRON HCL 4 MG/2ML IJ SOLN: 4.0000 mg | Freq: Four times a day (QID) | INTRAMUSCULAR | Status: DC | PRN

## 2018-09-29 MED ORDER — DOCUSATE SODIUM 100 MG PO CAPS
100.0000 mg | ORAL_CAPSULE | Freq: Two times a day (BID) | ORAL | Status: DC
  Administered 2018-09-29 – 2018-09-30 (×2): 100 mg via ORAL
  Filled 2018-09-29 (×2): qty 1

## 2018-09-29 MED ORDER — FENTANYL CITRATE (PF) 250 MCG/5ML IJ SOLN
INTRAMUSCULAR | Status: AC
  Filled 2018-09-29: qty 5

## 2018-09-29 MED ORDER — MIDAZOLAM HCL 2 MG/2ML IJ SOLN
INTRAMUSCULAR | Status: AC
  Filled 2018-09-29: qty 2

## 2018-09-29 MED ORDER — PROPOFOL 10 MG/ML IV BOLUS
INTRAVENOUS | Status: DC | PRN
  Administered 2018-09-29: 20 mg via INTRAVENOUS

## 2018-09-29 MED ORDER — ADULT MULTIVITAMIN W/MINERALS CH
1.0000 | ORAL_TABLET | Freq: Every day | ORAL | Status: DC
  Administered 2018-09-30 – 2018-10-06 (×6): 1 via ORAL
  Filled 2018-09-29 (×7): qty 1

## 2018-09-29 MED ORDER — SODIUM CHLORIDE 0.9 % IV SOLN
INTRAVENOUS | Status: DC | PRN
  Administered 2018-09-29: 20 ug/min via INTRAVENOUS

## 2018-09-29 MED ORDER — HYDROCODONE-ACETAMINOPHEN 7.5-325 MG PO TABS: 1.0000 | ORAL_TABLET | ORAL | Status: DC | PRN

## 2018-09-29 MED ORDER — ACETAMINOPHEN 325 MG PO TABS
325.0000 mg | ORAL_TABLET | Freq: Four times a day (QID) | ORAL | Status: DC | PRN
  Administered 2018-10-01: 650 mg via ORAL
  Filled 2018-09-29: qty 2

## 2018-09-29 MED ORDER — METOCLOPRAMIDE HCL 5 MG PO TABS: 5.0000 mg | ORAL_TABLET | Freq: Three times a day (TID) | ORAL | Status: DC | PRN

## 2018-09-29 MED ORDER — POLYETHYLENE GLYCOL 3350 17 G PO PACK
17.0000 g | PACK | Freq: Every day | ORAL | Status: DC | PRN
  Administered 2018-10-04: 17 g via ORAL
  Filled 2018-09-29: qty 1

## 2018-09-29 MED ORDER — NONFORMULARY OR COMPOUNDED ITEM
6.0000 mg | Freq: Two times a day (BID) | Status: DC
  Administered 2018-09-29 – 2018-10-06 (×13): 6 mg via ORAL
  Filled 2018-09-29 (×15): qty 1

## 2018-09-29 MED ORDER — ENSURE ENLIVE PO LIQD
237.0000 mL | Freq: Two times a day (BID) | ORAL | Status: DC
  Administered 2018-09-30 – 2018-10-06 (×9): 237 mL via ORAL

## 2018-09-29 MED ORDER — METOCLOPRAMIDE HCL 5 MG/ML IJ SOLN: 5.0000 mg | Freq: Three times a day (TID) | INTRAMUSCULAR | Status: DC | PRN

## 2018-09-29 MED ORDER — EPHEDRINE SULFATE 50 MG/ML IJ SOLN
INTRAMUSCULAR | Status: DC | PRN
  Administered 2018-09-29: 5 mg via INTRAVENOUS
  Administered 2018-09-29 (×4): 10 mg via INTRAVENOUS

## 2018-09-29 MED ORDER — HYDROCODONE-ACETAMINOPHEN 5-325 MG PO TABS: 1.0000 | ORAL_TABLET | ORAL | Status: DC | PRN

## 2018-09-29 MED ORDER — SODIUM CHLORIDE 0.45 % IV SOLN
INTRAVENOUS | Status: DC
  Administered 2018-09-29 – 2018-09-30 (×2): via INTRAVENOUS

## 2018-09-29 MED ORDER — ACETAMINOPHEN 500 MG PO TABS
500.0000 mg | ORAL_TABLET | Freq: Four times a day (QID) | ORAL | Status: AC
  Administered 2018-09-29 – 2018-09-30 (×3): 500 mg via ORAL
  Filled 2018-09-29 (×3): qty 1

## 2018-09-29 MED ORDER — BUPIVACAINE IN DEXTROSE 0.75-8.25 % IT SOLN
INTRATHECAL | Status: DC | PRN
  Administered 2018-09-29: 1.6 mL via INTRATHECAL

## 2018-09-29 MED ORDER — BUPIVACAINE HCL (PF) 0.25 % IJ SOLN
INTRAMUSCULAR | Status: AC
  Filled 2018-09-29: qty 30

## 2018-09-29 MED ORDER — ONDANSETRON HCL 4 MG PO TABS: 4.0000 mg | ORAL_TABLET | Freq: Four times a day (QID) | ORAL | Status: DC | PRN

## 2018-09-29 MED ORDER — BUPIVACAINE HCL (PF) 0.25 % IJ SOLN: INTRAMUSCULAR | Status: DC | PRN

## 2018-09-29 MED ORDER — PHENOL 1.4 % MT LIQD: 1.0000 | OROMUCOSAL | Status: DC | PRN

## 2018-09-29 SURGICAL SUPPLY — 75 items
APL SKNCLS STERI-STRIP NONHPOA (GAUZE/BANDAGES/DRESSINGS) ×1
BENZOIN TINCTURE PRP APPL 2/3 (GAUZE/BANDAGES/DRESSINGS) ×3 IMPLANT
BLADE SAW SAG 73X25 THK (BLADE) ×1
BLADE SAW SGTL 73X25 THK (BLADE) ×2 IMPLANT
BRUSH FEMORAL CANAL (MISCELLANEOUS) ×2 IMPLANT
CHLORAPREP W/TINT 26ML (MISCELLANEOUS) ×4 IMPLANT
CLOSURE WOUND 1/2 X4 (GAUZE/BANDAGES/DRESSINGS) ×2
COVER WAND RF STERILE (DRAPES) ×3 IMPLANT
DECANTER SPIKE VIAL GLASS SM (MISCELLANEOUS) ×2 IMPLANT
DRAPE IMP U-DRAPE 54X76 (DRAPES) ×3 IMPLANT
DRAPE INCISE IOBAN 66X45 STRL (DRAPES) ×2 IMPLANT
DRAPE ORTHO SPLIT 77X108 STRL (DRAPES) ×6
DRAPE SURG ORHT 6 SPLT 77X108 (DRAPES) ×2 IMPLANT
DRAPE U-SHAPE 47X51 STRL (DRAPES) ×3 IMPLANT
DRSG MEPILEX BORDER 4X8 (GAUZE/BANDAGES/DRESSINGS) ×3 IMPLANT
DRSG PAD ABDOMINAL 8X10 ST (GAUZE/BANDAGES/DRESSINGS) ×4 IMPLANT
ELECT CAUTERY BLADE 6.4 (BLADE) ×3 IMPLANT
ELECT REM PT RETURN 9FT ADLT (ELECTROSURGICAL) ×3
ELECTRODE REM PT RTRN 9FT ADLT (ELECTROSURGICAL) ×1 IMPLANT
FACESHIELD WRAPAROUND (MASK) ×6 IMPLANT
FACESHIELD WRAPAROUND OR TEAM (MASK) ×2 IMPLANT
GAUZE SPONGE 4X4 12PLY STRL (GAUZE/BANDAGES/DRESSINGS) ×3 IMPLANT
GAUZE XEROFORM 5X9 LF (GAUZE/BANDAGES/DRESSINGS) ×3 IMPLANT
GLOVE BIOGEL PI IND STRL 8 (GLOVE) ×2 IMPLANT
GLOVE BIOGEL PI INDICATOR 8 (GLOVE) ×2
GLOVE ORTHO TXT STRL SZ7.5 (GLOVE) ×6 IMPLANT
GLOVE SURG SS PI 6.5 STRL IVOR (GLOVE) ×4 IMPLANT
GOWN STRL REUS W/ TWL LRG LVL3 (GOWN DISPOSABLE) ×1 IMPLANT
GOWN STRL REUS W/ TWL LRG LVL4 (GOWN DISPOSABLE) IMPLANT
GOWN STRL REUS W/ TWL XL LVL3 (GOWN DISPOSABLE) ×1 IMPLANT
GOWN STRL REUS W/TWL 2XL LVL3 (GOWN DISPOSABLE) ×3 IMPLANT
GOWN STRL REUS W/TWL LRG LVL3 (GOWN DISPOSABLE) ×3
GOWN STRL REUS W/TWL LRG LVL4 (GOWN DISPOSABLE) ×6
GOWN STRL REUS W/TWL XL LVL3 (GOWN DISPOSABLE) ×3
HANDPIECE INTERPULSE COAX TIP (DISPOSABLE)
HEAD FEM UNIPOLAR 48 OD (Hips) ×2 IMPLANT
IMMOBILIZER KNEE 20 (SOFTGOODS) ×3 IMPLANT
IMMOBILIZER KNEE 20 THIGH 36 (SOFTGOODS) IMPLANT
IMMOBILIZER KNEE 22 UNIV (SOFTGOODS) IMPLANT
KIT BASIN OR (CUSTOM PROCEDURE TRAY) ×3 IMPLANT
KIT TURNOVER KIT B (KITS) ×3 IMPLANT
MANIFOLD NEPTUNE II (INSTRUMENTS) ×3 IMPLANT
NDL HYPO 25GX1X1/2 BEV (NEEDLE) ×1 IMPLANT
NDL SUT 2 .5 CRC MAYO 1.732X (NEEDLE) ×1 IMPLANT
NEEDLE HYPO 25GX1X1/2 BEV (NEEDLE) ×3 IMPLANT
NEEDLE MAYO TAPER (NEEDLE) ×3
NS IRRIG 1000ML POUR BTL (IV SOLUTION) ×3 IMPLANT
PACK TOTAL JOINT (CUSTOM PROCEDURE TRAY) ×3 IMPLANT
PACK UNIVERSAL I (CUSTOM PROCEDURE TRAY) ×3 IMPLANT
PAD ARMBOARD 7.5X6 YLW CONV (MISCELLANEOUS) ×6 IMPLANT
PIN STEINMAN 3/16 (PIN) ×3 IMPLANT
SET HNDPC FAN SPRY TIP SCT (DISPOSABLE) IMPLANT
SPACER FEM TAPERED +0 12/14 (Hips) ×2 IMPLANT
SPONGE LAP 18X18 X RAY DECT (DISPOSABLE) ×6 IMPLANT
SPONGE LAP 4X18 RFD (DISPOSABLE) ×6 IMPLANT
STAPLER VISISTAT 35W (STAPLE) ×3 IMPLANT
STEM SUMMIT BASIC PRESSFIT SZ3 (Hips) IMPLANT
STRIP CLOSURE SKIN 1/2X4 (GAUZE/BANDAGES/DRESSINGS) ×4 IMPLANT
SUCTION FRAZIER HANDLE 10FR (MISCELLANEOUS) ×2
SUCTION TUBE FRAZIER 10FR DISP (MISCELLANEOUS) ×1 IMPLANT
SUMMIT BASIC PRESSFIT SZ3 (Hips) ×3 IMPLANT
SUT ETHIBOND NAB CT1 #1 30IN (SUTURE) ×9 IMPLANT
SUT TICRON (SUTURE) ×4 IMPLANT
SUT VIC AB 0 CT1 27 (SUTURE) ×3
SUT VIC AB 0 CT1 27XBRD ANBCTR (SUTURE) IMPLANT
SUT VIC AB 1 CT1 27 (SUTURE) ×3
SUT VIC AB 1 CT1 27XBRD ANBCTR (SUTURE) IMPLANT
SUT VIC AB 2-0 CT1 27 (SUTURE) ×6
SUT VIC AB 2-0 CT1 TAPERPNT 27 (SUTURE) ×3 IMPLANT
SUT VICRYL 0 TIES 12 18 (SUTURE) ×3 IMPLANT
SYR CONTROL 10ML LL (SYRINGE) ×3 IMPLANT
TAPE CLOTH SURG 6X10 WHT LF (GAUZE/BANDAGES/DRESSINGS) ×2 IMPLANT
TOWEL OR 17X24 6PK STRL BLUE (TOWEL DISPOSABLE) ×3 IMPLANT
TOWEL OR 17X26 10 PK STRL BLUE (TOWEL DISPOSABLE) ×3 IMPLANT
WATER STERILE IRR 1000ML POUR (IV SOLUTION) ×6 IMPLANT

## 2018-09-29 NOTE — Social Work (Signed)
CSW acknowledging consult for SNF placement. Will follow for therapy recommendations.   Nashea Chumney, MSW, LCSWA [place] [facility] Social Work [phone]   

## 2018-09-29 NOTE — Anesthesia Procedure Notes (Addendum)
Procedure Name: MAC Date/Time: 09/29/2018 2:45 PM Performed by: Scheryl Darter, CRNA Pre-anesthesia Checklist: Patient identified, Emergency Drugs available, Suction available, Patient being monitored and Timeout performed Patient Re-evaluated:Patient Re-evaluated prior to induction Oxygen Delivery Method: Nasal cannula

## 2018-09-29 NOTE — Op Note (Signed)
Preop diagnosis: Angulated impacted left femoral neck fracture.  Postop diagnosis: Same  Procedure: Left press-fit hemiarthroplasty for femoral neck fracture.  Surgeon: Rodell Perna, MD  Anesthesia: Spinal  EBL: 100 cc  Implants:Depuy [place] basic #3 stem +0 ball and 48 mm monopolar head.  Surgeon: Rodell Perna, MD  Assistant: April Fulp, RNFA  Procedure after induction of spinal anesthesia patient placed in lateral position.  ChloraPrep was used and total hip sheets drapes impervious stockinette Coban was applied.  Sterile skin marker Betadine Steri-Drape x2 ceiling the skin.  Timeout procedure was completed Ancef was given prophylactically.  Posterior approach was made gluteus maximus split in line of the fibers Charnley retractor placed piriformis was tagged.  Capsule was opened femoral neck fracture impacted with varus was noted and neck was cut 1 fingerbreadth above the lesser trochanter initially.  Fracture appears to extend slightly below this was recut 10 mm below which gave an intact calcar.  Ball was measured 48 mm good suction fit with a 48 mm trial.  Canal preparation with lateralizer cookie cutter sequential broaching progressing up to #3 that gave a nice tight fit.  +0 ball neck length with 48 mm snap-on monopolar was inserted after irrigation of the canal socket was checked sciatic nerve protected hip was reduced with flexion to 90 degrees internal rotation 85 degrees without subluxation of the hip.  Leg lengths are equal.  Standard layer closure tensor fascia and gluteus maximus with 0 Vicryl suture.  2-0 Vicryl subtenons tissue skin staple closure postop dressing and knee immobilizer.  Patient tolerated the procedure well.

## 2018-09-29 NOTE — Progress Notes (Signed)
  Echocardiogram 2D Echocardiogram has been performed.  Jennette Dubin 09/29/2018, 8:58 AM

## 2018-09-29 NOTE — Progress Notes (Signed)
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 at [facility] Severe dementia (MMSE 11/30 on last check 09/2017) with unstable gait and recurrent falls Bipolar Neuropathic pain on gabapentin Hyperlipidemia  Just seen 09/24/2018 and felt to probably have had a mild cystitis PCP noted a decline in her functional state she is however not doing as well as she had been and seems to be declining (now passing urine on herself whereas previously she would be understanding and coherent enough to pull down her underwear)  Patient was apparently standing and turned and fell onto her left side with loss of balance and unlikely syncope although this is not completely clear given patient's difficulty giving h/o She was found to be hypoxic in the 70s initially placed on oxygen-chest x-ray showed pulmonary edema  She was given a dose of Lasix with resolution orthopedics was consulted who felt she could go to surgery   A & Plan Left hip fracture She is now cleared for surgery from a cardio-pulmperspective given neg troponin and reassuring ECHO Rest as per Ortho in terms of DVT proph, WB status, Post op management etc Pulmonary edema likely secondary to undiagnosed heart failure Echocardiogram shows EF 60-65% Patient received 1 dose of IV Lasix 2/25 Will keep volumes Even and would NOT give excessive fluids peri-op Recent E. coli UTI Rx recently--UA neg from admit no further work-up nor need for RX Leukocytosis trending down--likely from stres of surgery--no fever monitor History of dementia  Continue Exelon when possible  Post op would be cautious with opiates, and  would hold gabapentin Chronic kidney disease stage II  tredns are stable at this time Peripheral arterial disease  Cont ASa and plavix Moderate malnutrition BMI  22.7   Lovenox D/w husband at the bedside Inpatient await surgery and likely will eventually need SNF placement  Ashlan Dignan, MD  [facility] Via [employer] app OR [email or web address] 7PM-7AM contact night coverage as above 09/29/2018, 8:22 AM  LOS: 1 day   Consultants:  Ortho  Procedures:  None yet  Antimicrobials:  none  Interval history/Subjective: Non verbal sitting in the bed No fever Npo Cannot give ROS  Objective:  Vitals:  Vitals:   09/29/18 0030 09/29/18 0537  BP: (!) 127/55 (!) 113/59  Pulse: 75 75  Resp: (!) 21 20  Temp: 99.4 F (37.4 C) 98 F (36.7 C)  SpO2: 99% 97%    Exam:   awake alert pleasant in nad has oxygen on cta b no added sound s1s 2 no m abd soft nt nd no rebound no guard Neuro not assessed given lack of cooperation   I have personally reviewed the following:  DATA   Labs:  Bun /creat in same stable range 20/1.24  troponins are neg  Imaging studies: FINDINGS  Left Ventricle: The left ventricle has normal systolic function, with an ejection fraction of 60-65%. The cavity size was normal. There is no increase in left ventricular wall thickness. Left ventricular diastology could not be evaluated due to  nondiagnostic images. Right Ventricle: The right ventricle has normal systolic function. The cavity was normal. There is no increase in right ventricular wall thickness. Left Atrium: left atrial size was normal in size Right Atrium: right atrial size was normal in size. Interatrial Septum: No atrial level shunt detected by color flow Doppler. Increased thickness of the atrial septum sparing the fossa ovalis consistent  with The interatrial septum appears to be lipomatous. Pericardium: There is no evidence of pericardial effusion. Mitral Valve: The mitral valve is normal in structure. Mitral valve regurgitation is not visualized by color flow Doppler. Tricuspid Valve: The tricuspid valve is normal in structure. Tricuspid valve  regurgitation was not visualized by color flow Doppler. Aortic Valve: The aortic valve is tricuspid Moderate calcification of the aortic valve. Aortic valve regurgitation was not visualized by color flow Doppler. Pulmonic Valve: The pulmonic valve was normal in structure. Pulmonic valve regurgitation is not visualized by color flow Doppler. Venous: The inferior vena cava is normal in size with greater than 50% respiratory variability.  Medical tests:  n   Test discussed with performing physician:  n  Decision to obtain old records:  n  Review and summation of old records:  n  Scheduled Meds: . chlorhexidine  60 mL Topical Once  . docusate sodium  100 mg Oral BID  . escitalopram  10 mg Oral Daily  . folic acid  035 mcg Oral Daily  . mupirocin ointment  1 application Nasal BID  . povidone-iodine  2 application Topical Once  . pravastatin  10 mg Oral Daily  . pyridoxine  100 mg Oral Daily  . rivastigmine  6 mg Oral BID   Continuous Infusions: .  ceFAZolin (ANCEF) IV      Principal Problem:   Hip fracture (HCC) Active Problems:   Dementia (HCC)   CKD (chronic kidney disease), stage III (HCC)   PAD (peripheral artery disease) (HCC)   Acute pulmonary edema (HCC)   Acute on chronic respiratory failure with hypoxia (HCC)   LOS: 1 day

## 2018-09-29 NOTE — Progress Notes (Signed)
Initial Nutrition Assessment  DOCUMENTATION CODES:   Not applicable  INTERVENTION:    After surgery, begin Ensure Enlive po BID, each supplement provides 350 kcal and 20 grams of protein  Multivitamin daily  NUTRITION DIAGNOSIS:   Increased nutrient needs related to hip fracture as evidenced by estimated needs.  GOAL:   Patient will meet greater than or equal to 90% of their needs  MONITOR:   PO intake, Supplement acceptance, Labs, Skin  REASON FOR ASSESSMENT:   Consult Hip fracture protocol  ASSESSMENT:   85 yo female with PMH of anxiety, Alzheimer's dementia, HTN who was admitted with L hip fracture s/p fall.    Plans for hip repair surgery later today.  Patient's husband provided nutrition hx. Patient lying in bed and answered "yes" or "no" a few times. Patient has followed the same meal plan for a long time. She has cereal with blueberries and milk for breakfast, sandwich (1 slice of bread, ham, & cheese) with an apple for lunch, and a variety of salads or vegetarian casserole for dinner. Her weight has been stable for the past year. She also takes a vitamin D supplement.   Labs reviewed.  Medications reviewed and include folic acid, vitamin B-6.  NUTRITION - FOCUSED PHYSICAL EXAM:    Most Recent Value  Orbital Region  No depletion  Upper Arm Region  Mild depletion  Thoracic and Lumbar Region  No depletion  Buccal Region  No depletion  Temple Region  No depletion  Clavicle Bone Region  No depletion  Clavicle and Acromion Bone Region  No depletion  Scapular Bone Region  Unable to assess  Dorsal Hand  Moderate depletion  Patellar Region  No depletion  Anterior Thigh Region  No depletion  Posterior Calf Region  No depletion  Edema (RD Assessment)  Mild  Hair  Reviewed  Eyes  Reviewed  Mouth  Reviewed  Skin  Reviewed  Nails  Reviewed       Diet Order:   Diet Order            Diet NPO time specified Except for: Sips with Meds  Diet effective midnight               EDUCATION NEEDS:   No education needs have been identified at this time  Skin:  Skin Assessment: Reviewed RN Assessment  Last BM:  2/24  Height:   Ht Readings from Last 1 Encounters:  09/28/18 5\' 2"  (1.575 m)    Weight:   Wt Readings from Last 1 Encounters:  09/29/18 56.3 kg    Ideal Body Weight:  50 kg  BMI:  Body mass index is 22.7 kg/m.  Estimated Nutritional Needs:   Kcal:  1350-1550  Protein:  65-80 gm  Fluid:  >/= 1.5 L    Molli Barrows, RD, LDN, [place] Pager [phone] After Hours Pager [phone]

## 2018-09-29 NOTE — Anesthesia Procedure Notes (Signed)
Spinal  Patient location during procedure: OR Start time: 09/29/2018 2:56 PM End time: 09/29/2018 3:02 PM Staffing Anesthesiologist: Josephine Igo, MD Performed: anesthesiologist  Preanesthetic Checklist Completed: patient identified, site marked, surgical consent, pre-op evaluation, timeout performed, IV checked, risks and benefits discussed and monitors and equipment checked Spinal Block Patient position: sitting Prep: site prepped and draped and DuraPrep Patient monitoring: heart rate, cardiac monitor, continuous pulse ox and blood pressure Approach: midline Location: L3-4 Injection technique: single-shot Needle Needle type: Pencan  Needle gauge: 24 G Needle length: 9 cm Needle insertion depth: 7 cm Assessment Sensory level: T4 Additional Notes Patient tolerated procedure well. Adequate sensory level.

## 2018-09-29 NOTE — Telephone Encounter (Signed)
Pt's husband called back for the information on his wife's medications. He stated that he was at the hospital now and trying to follow his wife to recover. He was too upset to understand the information that was being  given to him. He will call back tomorrow when he can understand and write the information down.

## 2018-09-29 NOTE — Telephone Encounter (Signed)
Tried calling, lm to return call to clinic. PEC may give directions per Dr. [name]. CRM created.

## 2018-09-29 NOTE — Telephone Encounter (Signed)
I am sorry to hear about Kathryn [name] fall.  Gabapentin and Lexapro need to be weaned off. Try to continue these and if she refuses you can try latter same day or next day. To wean off you can give meds every other day x 7 days and then every 3rd day for 7 days and stop. Multivitamins, Aspirin, and pravastatin can be discontinued. Exelon could be changed to patch or you can hold on med until she recovers.  Thanks, BJ

## 2018-09-29 NOTE — Progress Notes (Signed)
SLP Cancellation Note  Patient Details Name: Kathryn [name] MRN: [id number] DOB: 28-Nov-1933   Cancelled treatment:       Reason Eval/Treat Not Completed: Medical issues which prohibited therapy. Pt fell yesterday and broke her hip. She is scheduled to have surgery at 1430 today and is therefore NPO for surgery. Pt's case was discussed with Jas, RN and it was agreed that the bedside swallow evaluation would have to be deferred until after the surgery is completed due to the pt's NPO status.   Linell Meldrum I. Hardin Negus, [place], [place] Office number [phone] Pager [place] 09/29/2018, 10:18 AM

## 2018-09-29 NOTE — Telephone Encounter (Signed)
Message sent to Dr. Jordan for review and approval. 

## 2018-09-29 NOTE — Telephone Encounter (Signed)
Copied from [place] [phone]. Topic: General - Other >> Sep 29, 2018  9:27 AM Virl Axe D wrote: Reason for CRM:  Pt's husband stated pt fell yesterday and broker her hip. She is scheduled to have surgery at 2:30p today at [facility]. He stated she is having difficulty taking her medications and he wants to know if she can hold off on taking them for now. He would like a call back. [phone]

## 2018-09-29 NOTE — Patient Outreach (Signed)
[facility]) Care Management  09/29/2018  SARAHANN HORRELL July 02, 1934 [id number]   Telephone Screen  Referral Date: 09/28/2018 Referral Source: Nurse Call Center Referral Reason: " caller states his wife fell, has low BP and has leg pain, 2 UTI's in last month"-advised by call center RN to go to the ED Insurance: HTA    Referral received. Per chart review, patient admitted to hospital on 09/28/2018 for left hip fracture.   Plan: RN CM will close case at this time.    Enzo Montgomery, RN,BSN,CCM [place] Management Telephonic Care Management Coordinator Direct Phone: [phone] Toll Free: [phone] Fax: [phone]

## 2018-09-29 NOTE — Transfer of Care (Signed)
Immediate Anesthesia Transfer of Care Note  Patient: Kathryn [name]  Procedure(s) Performed: left hip (HEMIARTHROPLASTY) (Left Hip)  Patient Location: PACU  Anesthesia Type:MAC and Spinal  Level of Consciousness: awake, alert , oriented and sedated  Airway & Oxygen Therapy: Patient Spontanous Breathing and Patient connected to nasal cannula oxygen  Post-op Assessment: Report given to RN, Post -op Vital signs reviewed and stable and Patient moving all extremities  Post vital signs: Reviewed and stable  Last Vitals:  Vitals Value Taken Time  BP 113/57 09/29/2018  4:24 PM  Temp    Pulse 73 09/29/2018  4:35 PM  Resp 21 09/29/2018  4:35 PM  SpO2 100 % 09/29/2018  4:35 PM  Vitals shown include unvalidated device data.  Last Pain:  Vitals:   09/29/18 1133  TempSrc: Oral  PainSc:          Complications: No apparent anesthesia complications

## 2018-09-29 NOTE — Anesthesia Postprocedure Evaluation (Signed)
Anesthesia Post Note  Patient: Kathryn [name]  Procedure(s) Performed: left hip (HEMIARTHROPLASTY) (Left Hip)     Patient location during evaluation: PACU Anesthesia Type: Spinal Level of consciousness: awake and alert Pain management: pain level controlled Vital Signs Assessment: post-procedure vital signs reviewed and stable Respiratory status: spontaneous breathing, nonlabored ventilation, respiratory function stable and patient connected to nasal cannula oxygen Cardiovascular status: blood pressure returned to baseline and stable Postop Assessment: no headache, no backache, spinal receding and no apparent nausea or vomiting Anesthetic complications: no    Last Vitals:  Vitals:   09/29/18 1133 09/29/18 1625  BP: (!) 117/56   Pulse: 76   Resp: (!) 22   Temp: 36.8 C (!) (P) 36.3 C  SpO2: 93%     Last Pain:  Vitals:   09/29/18 1133  TempSrc: Oral  PainSc:                  Tonisha Silvey A.

## 2018-09-29 NOTE — Interval H&P Note (Signed)
History and Physical Interval Note:  09/29/2018 2:29 PM  Kathryn [name]  has presented today for surgery, with the diagnosis of left femoral neck fracture  The various methods of treatment have been discussed with the patient and family. After consideration of risks, benefits and other options for treatment, the patient has consented to  Procedure(s): left hip (HEMIARTHROPLASTY) (Left) as a surgical intervention .  The patient's history has been reviewed, patient examined, no change in status, stable for surgery.  I have reviewed the patient's chart and labs.  Questions were answered to the patient's satisfaction.     Marybelle Killings

## 2018-09-29 NOTE — Progress Notes (Signed)
Subjective:   Procedure(s) (LRB): left hip (HEMIARTHROPLASTY) (Left) Patient reports pain as mild.    Objective: Vital signs in last 24 hours: Temp:  [98 F (36.7 C)-100 F (37.8 C)] 98 F (36.7 C) (02/26 0537) Pulse Rate:  [73-89] 75 (02/26 0537) Resp:  [20-28] 20 (02/26 0537) BP: (113-136)/(55-81) 113/59 (02/26 0537) SpO2:  [76 %-99 %] 97 % (02/26 0537) Weight:  [56.3 kg-56.7 kg] 56.3 kg (02/26 0537)  Intake/Output from previous day: 02/25 0701 - 02/26 0700 In: 550 [P.O.:50; IV Piggyback:500] Out: 1000 [Urine:1000] Intake/Output this shift: No intake/output data recorded.  Recent Labs    09/28/18 1339 09/29/18 0433  HGB 14.7 13.3   Recent Labs    09/28/18 1339 09/29/18 0433  WBC 25.6* 14.6*  RBC 4.69 4.35  HCT 46.8* 41.7  PLT 288 245   Recent Labs    09/28/18 1339 09/29/18 0433  NA 141 141  K 3.9 3.9  CL 105 102  CO2 28 27  BUN 23 20  CREATININE 1.01* 1.24*  GLUCOSE 145* 125*  CALCIUM 9.0 8.6*   Recent Labs    09/28/18 1339  INR 1.0    Neurologically intact Dg Chest 1 View  Result Date: 09/28/2018 CLINICAL DATA:  85 year old who fell this morning and injured the LEFT hip. Generalized weakness. Current history of Alzheimer's dementia. EXAM: CHEST  1 VIEW COMPARISON:  09/11/2016, 02/04/2007. FINDINGS: AP SEMI-ERECT view demonstrates borderline to mild cardiac enlargement, unchanged. Thoracic aorta tortuous and atherosclerotic. Hilar and mediastinal contours otherwise unremarkable. Stable linear scarring at the lung bases. Diffuse interstitial pulmonary opacities which are new since the most recent prior examination. No confluent airspace consolidation. No visible pleural effusions. IMPRESSION: Acute mild CHF and/or fluid overload with interstitial pulmonary edema (favored over interstitial pneumonitis). Electronically Signed   By: Evangeline Dakin M.D.   On: 09/28/2018 13:24   Ct Hip Left Wo Contrast  Result Date: 09/28/2018 CLINICAL DATA:  [place].   Left hip pain. EXAM: CT OF THE LEFT HIP WITHOUT CONTRAST TECHNIQUE: Multidetector CT imaging of the left hip was performed according to the standard protocol. Multiplanar CT image reconstructions were also generated. COMPARISON:  Radiographs 09/28/2018 FINDINGS: There is an impacted femoral neck fracture with very slight external rotation of the femoral head in relation to the femoral neck. Moderate hip joint degenerative changes. The acetabulum is intact. The visualized left hemipelvis is intact. The pubic symphysis and visualized left SI joint appear normal. No significant intrapelvic abnormalities are identified on the left side. No inguinal mass or hernia. IMPRESSION: 1. Impacted left femoral neck fracture. 2. Intact left bony hemipelvis. Electronically Signed   By: Marijo Sanes M.D.   On: 09/28/2018 15:31   Dg Hip Unilat With Pelvis 2-3 Views Left  Result Date: 09/28/2018 CLINICAL DATA:  85 year old who fell this morning and injured the LEFT hip. Initial encounter. EXAM: DG HIP (WITH OR WITHOUT PELVIS) 2-3V LEFT COMPARISON:  None. FINDINGS: The LEFT hip is internally rotated which I believe accounts for the foreshortening of the femoral neck, though an impacted intertrochanteric fracture could have a similar appearance. Joint space well-preserved for age. Bone mineral density relatively well preserved for age. Included AP pelvis demonstrates mild-to-moderate narrowing of the joint space in the contralateral RIGHT hip. Sacroiliac joints and symphysis pubis anatomically aligned without diastasis. Degenerative changes involving the visualized lower lumbar spine. IMPRESSION: Apparent foreshortening of the LEFT femoral neck I believe is related to the fact that the LEFT hip is internally rotated, though an impacted  intertrochanteric fracture could have a similar appearance. If there is strong clinical concern for fracture, CT may be helpful in further evaluation. Electronically Signed   By: Evangeline Dakin  M.D.   On: 09/28/2018 13:29    Assessment/Plan:   Procedure(s) (LRB): left hip (HEMIARTHROPLASTY) (Left) Plan:  Surgery this afternoon. Husband states pt cannot swallow  the 4 capsules. He brought in her Exelon 6mg  which is also a capsule but she can swallow one. When given more she chews them.  Marybelle Killings 09/29/2018, 7:54 AM

## 2018-09-29 NOTE — Anesthesia Preprocedure Evaluation (Addendum)
Anesthesia Evaluation  Patient identified by MRN, date of birth, ID band Patient awake and Patient confused    Reviewed: Allergy & Precautions, NPO status , Patient's Chart, lab work & pertinent test results  Airway Mallampati: II  TM Distance: >3 FB Neck ROM: Full    Dental no notable dental hx. (+) Teeth Intact   Pulmonary former smoker,    Pulmonary exam normal breath sounds clear to auscultation       Cardiovascular hypertension, + Peripheral Vascular Disease and +CHF  Normal cardiovascular exam Rhythm:Regular Rate:Normal  Echo 09/29/2018 Left Ventricle: The left ventricle has normal systolic function, with an ejection fraction of 60-65%. The cavity size was normal. There is no increase in left ventricular wall thickness. Left ventricular diastology could not be evaluated due to nondiagnostic images. Right Ventricle: The right ventricle has normal systolic function. The cavity was normal. There is no increase in right ventricular wall thickness. Left Atrium: left atrial size was normal in size Right Atrium: right atrial size was normal in size. Interatrial Septum: No atrial level shunt detected by color flow Doppler. Increased thickness of the atrial septum sparing the fossa ovalis consistent with The interatrial septum appears to be lipomatous. Pericardium: There is no evidence of pericardial effusion. Mitral Valve: The mitral valve is normal in structure. Mitral valve regurgitation is not visualized by color flow Doppler. Tricuspid Valve: The tricuspid valve is normal in structure. Tricuspid valve regurgitation was not visualized by color flow Doppler. Aortic Valve: The aortic valve is tricuspid Moderate calcification of the aortic valve. Aortic valve regurgitation was not visualized by color flow Doppler. Pulmonic Valve: The pulmonic valve was normal in structure. Pulmonic valve regurgitation is not visualized by color flow  Doppler. Venous: The inferior vena cava is normal in size with greater than 50% respiratory variability.   Neuro/Psych  Headaches, PSYCHIATRIC DISORDERS Anxiety Dementia    GI/Hepatic negative GI ROS, Neg liver ROS,   Endo/Other  Hyperlipidemia  Renal/GU Renal InsufficiencyRenal disease  negative genitourinary   Musculoskeletal  (+) Arthritis , Osteoarthritis,  Left femoral neck Fx   Abdominal   Peds  Hematology negative hematology ROS (+)   Anesthesia Other Findings   Reproductive/Obstetrics                            Anesthesia Physical Anesthesia Plan  ASA: III  Anesthesia Plan: Spinal   Post-op Pain Management:    Induction:   PONV Risk Score and Plan: Ondansetron, Dexamethasone and Treatment may vary due to age or medical condition  Airway Management Planned: Natural Airway, Simple Face Mask and Nasal Cannula  Additional Equipment:   Intra-op Plan:   Post-operative Plan:   Informed Consent: I have reviewed the patients History and Physical, chart, labs and discussed the procedure including the risks, benefits and alternatives for the proposed anesthesia with the patient or authorized representative who has indicated his/her understanding and acceptance.     Dental advisory given  Plan Discussed with: CRNA and Surgeon  Anesthesia Plan Comments:         Anesthesia Quick Evaluation

## 2018-09-30 ENCOUNTER — Inpatient Hospital Stay (HOSPITAL_COMMUNITY): Payer: PPO

## 2018-09-30 ENCOUNTER — Encounter (HOSPITAL_COMMUNITY): Payer: Self-pay | Admitting: Orthopaedic Surgery

## 2018-09-30 LAB — RENAL FUNCTION PANEL
ALBUMIN: 2.7 g/dL — AB (ref 3.5–5.0)
Anion gap: 9 (ref 5–15)
BUN: 19 mg/dL (ref 8–23)
CO2: 27 mmol/L (ref 22–32)
Calcium: 8.1 mg/dL — ABNORMAL LOW (ref 8.9–10.3)
Chloride: 100 mmol/L (ref 98–111)
Creatinine, Ser: 1.11 mg/dL — ABNORMAL HIGH (ref 0.44–1.00)
GFR calc Af Amer: 54 mL/min — ABNORMAL LOW (ref >60)
GFR calc non Af Amer: 46 mL/min — ABNORMAL LOW (ref >60)
GLUCOSE: 145 mg/dL — AB (ref 70–99)
Phosphorus: 3.2 mg/dL (ref 2.5–4.6)
Potassium: 3.8 mmol/L (ref 3.5–5.1)
Sodium: 136 mmol/L (ref 135–145)

## 2018-09-30 LAB — URINE CULTURE
Culture: NO GROWTH
Special Requests: NORMAL

## 2018-09-30 LAB — CBC
HCT: 34.5 % — ABNORMAL LOW (ref 36.0–46.0)
Hemoglobin: 11.3 g/dL — ABNORMAL LOW (ref 12.0–15.0)
MCH: 31.5 pg (ref 26.0–34.0)
MCHC: 32.8 g/dL (ref 30.0–36.0)
MCV: 96.1 fL (ref 80.0–100.0)
Platelets: 212 10*3/uL (ref 150–400)
RBC: 3.59 MIL/uL — AB (ref 3.87–5.11)
RDW: 12.9 % (ref 11.5–15.5)
WBC: 9.8 10*3/uL (ref 4.0–10.5)
nRBC: 0 % (ref 0.0–0.2)

## 2018-09-30 MED ORDER — FUROSEMIDE 10 MG/ML IJ SOLN
40.0000 mg | Freq: Two times a day (BID) | INTRAMUSCULAR | Status: AC
  Administered 2018-09-30 (×2): 40 mg via INTRAVENOUS
  Filled 2018-09-30 (×2): qty 4

## 2018-09-30 NOTE — NC FL2 (Addendum)
[place] LEVEL OF CARE SCREENING TOOL     IDENTIFICATION  Patient Name: Kathryn [name] Birthdate: Jun 21, 1934 Sex: female Admission Date (Current Location): 09/28/2018  [facility] and [place] Number:  Herbalist and Address:  [facility], [place] [address] [place], [place] [address]      Provider Number: [id number]  Attending Physician Name and Address:  Nita Sells, MD  Relative Name and Phone Number:  Raymonda Pell, [phone]    Current Level of Care: Hospital Recommended Level of Care: [place] Prior Approval Number:    Date Approved/Denied:   PASRR Number: [id number] E valid 2/28 - 10/31/18  Discharge Plan: SNF    Current Diagnoses: Patient Active Problem List   Diagnosis Date Noted  . Hip fracture ([place]) 09/28/2018  . Acute pulmonary edema ([place]) 09/28/2018  . Acute on chronic respiratory failure with hypoxia ([place]) 09/28/2018  . Constipation 09/24/2018  . Generalized osteoarthritis of multiple sites 09/24/2018  . PAD (peripheral artery disease) ([place]) 07/20/2018  . HLD (hyperlipidemia) 06/15/2018  . Unstable gait 02/19/2018  . Aphasia 09/14/2017  . Headache 07/10/2016  . CKD (chronic kidney disease), stage III ([place]) 06/20/2016  . Dementia ([place]) 09/04/2015  . Generalized anxiety disorder 05/29/2015  . HTN (hypertension) 05/29/2015  . Memory loss 05/28/2015    Orientation RESPIRATION BLADDER Height & Weight     (Disoriented x4, dementia)  Normal Continent Weight: 60.9 kg Height:  5\' 2"  (157.5 cm)  BEHAVIORAL SYMPTOMS/MOOD NEUROLOGICAL BOWEL NUTRITION STATUS      Continent Diet  AMBULATORY STATUS COMMUNICATION OF NEEDS Skin     Verbally Surgical wounds(Closed incision, left hip)                       Personal Care Assistance Level of Assistance  Bathing, Feeding, Dressing           Functional Limitations Info  Sight, Hearing, Speech Sight Info: Impaired Hearing Info:  Adequate Speech Info: Adequate    SPECIAL CARE FACTORS FREQUENCY  PT (By licensed PT), OT (By licensed OT)                    Contractures Contractures Info: Not present    Additional Factors Info  Code Status, Allergies Code Status Info: Full code Allergies Info: Aricept (donepezil), sulfonamide derivatives           Current Medications (09/30/2018):  This is the current hospital active medication list Current Facility-Administered Medications  Medication Dose Route Frequency Provider Last Rate Last Dose  . 0.45 % sodium chloride infusion   Intravenous Continuous Marybelle Killings, MD 75 mL/hr at 09/30/18 [phone]    . acetaminophen (TYLENOL) tablet 325-650 mg  325-650 mg Oral Q6H PRN Marybelle Killings, MD      . acetaminophen (TYLENOL) tablet 500 mg  500 mg Oral Q6H Marybelle Killings, MD   500 mg at 09/30/18 0520  . aspirin EC tablet 325 mg  325 mg Oral Q breakfast Marybelle Killings, MD   325 mg at 09/30/18 0911  . bisacodyl (DULCOLAX) suppository 10 mg  10 mg Rectal Daily PRN Marybelle Killings, MD      . docusate sodium (COLACE) capsule 100 mg  100 mg Oral BID Marybelle Killings, MD   100 mg at 09/30/18 0912  . feeding supplement (ENSURE ENLIVE) (ENSURE ENLIVE) liquid 237 mL  237 mL Oral BID BM Marybelle Killings, MD   237 mL  at 09/30/18 0919  . folic acid (FOLVITE) tablet 0.5 mg  500 mcg Oral Daily Marybelle Killings, MD   0.5 mg at 09/30/18 0912  . HYDROcodone-acetaminophen (NORCO) 7.5-325 MG per tablet 1-2 tablet  1-2 tablet Oral Q4H PRN Marybelle Killings, MD      . HYDROcodone-acetaminophen (NORCO/VICODIN) 5-325 MG per tablet 1-2 tablet  1-2 tablet Oral Q6H PRN Marybelle Killings, MD      . HYDROcodone-acetaminophen (NORCO/VICODIN) 5-325 MG per tablet 1-2 tablet  1-2 tablet Oral Q4H PRN Marybelle Killings, MD      . lactated ringers infusion   Intravenous Continuous Josephine Igo, MD 35 mL/hr at 09/29/18 1354    . menthol-cetylpyridinium (CEPACOL) lozenge 3 mg  1 lozenge Oral PRN Marybelle Killings, MD       Or  . phenol  (CHLORASEPTIC) mouth spray 1 spray  1 spray Mouth/Throat PRN Marybelle Killings, MD      . metoCLOPramide (REGLAN) tablet 5-10 mg  5-10 mg Oral Q8H PRN Marybelle Killings, MD       Or  . metoCLOPramide (REGLAN) injection 5-10 mg  5-10 mg Intravenous Q8H PRN Marybelle Killings, MD      . morphine 2 MG/ML injection 0.5 mg  0.5 mg Intravenous Q3H PRN Bonnielee Haff, MD      . morphine 2 MG/ML injection 0.5-1 mg  0.5-1 mg Intravenous Q2H PRN Marybelle Killings, MD      . multivitamin with minerals tablet 1 tablet  1 tablet Oral Daily Nita Sells, MD   1 tablet at 09/30/18 0912  . ondansetron (ZOFRAN) tablet 4 mg  4 mg Oral Q6H PRN Marybelle Killings, MD       Or  . ondansetron [facility]) injection 4 mg  4 mg Intravenous Q6H PRN Marybelle Killings, MD      . polyethylene glycol (MIRALAX / GLYCOLAX) packet 17 g  17 g Oral Daily PRN Marybelle Killings, MD      . pravastatin (PRAVACHOL) tablet 10 mg  10 mg Oral Daily Bonnielee Haff, MD   10 mg at 09/30/18 0911  . pyridOXINE (VITAMIN B-6) tablet 100 mg  100 mg Oral Daily Bonnielee Haff, MD   100 mg at 09/30/18 0913  . Rivastigmine 6 mg capsule  6 mg Oral BID Nita Sells, MD   6 mg at 09/30/18 0916  . senna-docusate (Senokot-S) tablet 1 tablet  1 tablet Oral QHS PRN Bonnielee Haff, MD         Discharge Medications: Please see discharge summary for a list of discharge medications.  Relevant Imaging Results:  Relevant Lab Results:   Additional Information SSN- [id number]  Estanislado Emms, LCSW

## 2018-09-30 NOTE — Progress Notes (Addendum)
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 at [facility] Severe dementia (MMSE 11/30 on last check 09/2017) with unstable gait and recurrent falls Bipolar Neuropathic pain on gabapentin Hyperlipidemia  Just seen 09/24/2018 and felt to probably have had a mild cystitis PCP noted a decline in her functional state she is however not doing as well as she had been and seems to be declining (now passing urine on herself whereas previously she would be understanding and coherent enough to pull down her underwear)  Patient was apparently standing and turned and fell onto her left side with loss of balance and unlikely syncope although this is not completely clear given patient's difficulty giving h/o She was found to be hypoxic in the 70s initially placed on oxygen-chest x-ray showed pulmonary edema  She was given a dose of Lasix with resolution orthopedics was consulted who felt she could go to surgery   A & Plan Left hip fracture status post left press-fit hemiarthroplasty 09/29/2018 per Ortho in terms of DVT proph [placed on 325 of aspirin needed until 10/27/2018], WB status, Has posterior hip precautions Post op management etc Expected postoperative and dilutional anemia  On return a.m. trends transfusion threshold  7 Pulmonary edema likely secondary to undiagnosed heart failure Echocardiogram shows EF 60-65% Patient received 1 dose of IV Lasix 2/25 Continue with IV Lasix 40 twice daily given when taking her off oxygen she drops her sats below 90 We will get a chest x-ray two-view in the morning 2/28 to confirm no more fluid Recent E. coli UTI Rx recently--UA neg from admit no further work-up nor need for RX Leukocytosis Solved. History of dementia  Continue Exelon  Post op would be cautious with opiates, and would hold  gabapentin--- clear would completely discontinue gabapentin in the outpatient setting Chronic kidney disease stage II  Trends are stable at this time Peripheral arterial disease  Cont ASa and plavix Moderate malnutrition BMI 22.7   Lovenox D/w husband at the bedside on numerous days Stabilizing for likely skilled placement in the next 24 hours  Hulbert Branscome, MD  [facility] Via [employer] app OR [email or web address] 7PM-7AM contact night coverage as above 09/30/2018, 12:48 PM  LOS: 2 days   Consultants:  Ortho  Procedures:  None yet  Antimicrobials:  none  Interval history/Subjective:  Sitting in chair Family member at bedside very pleased with progress and motility Not complaining of pain Review of systems is difficult given her dementia  Objective:  Vitals:  Vitals:   09/30/18 0944 09/30/18 1158  BP: (!) 127/51 119/63  Pulse: 88 86  Resp:  20  Temp: 99.2 F (37.3 C) 98.9 F (37.2 C)  SpO2: (!) 88% 97%    Exam:  Awake pleasantly confused EOMI NCAT No pallor On oxygen Chest clear Abdomen soft Large reinforce dressing over left lateral hip area  I have personally reviewed the following:  DATA   Labs:  Bun /creat 19/1.1 and stable  Hemoglobin down from 14.7 admission to 11.3  Imaging studies: FINDINGS  Left Ventricle: The left ventricle has normal systolic function, with an ejection fraction of 60-65%. The cavity size was normal. There is no increase in left ventricular wall thickness. Left ventricular diastology could not be evaluated due to  nondiagnostic images. Right Ventricle: The right ventricle has normal systolic function. The cavity was normal. There is no increase in right  ventricular wall thickness. Left Atrium: left atrial size was normal in size Right Atrium: right atrial size was normal in size. Interatrial Septum: No atrial level shunt detected by color flow Doppler. Increased thickness of the atrial septum sparing the fossa ovalis  consistent with The interatrial septum appears to be lipomatous. Pericardium: There is no evidence of pericardial effusion. Mitral Valve: The mitral valve is normal in structure. Mitral valve regurgitation is not visualized by color flow Doppler. Tricuspid Valve: The tricuspid valve is normal in structure. Tricuspid valve regurgitation was not visualized by color flow Doppler. Aortic Valve: The aortic valve is tricuspid Moderate calcification of the aortic valve. Aortic valve regurgitation was not visualized by color flow Doppler. Pulmonic Valve: The pulmonic valve was normal in structure. Pulmonic valve regurgitation is not visualized by color flow Doppler. Venous: The inferior vena cava is normal in size with greater than 50% respiratory variability.  Medical tests:  n   Test discussed with performing physician:  n  Decision to obtain old records:  n  Review and summation of old records:  n  Scheduled Meds: . acetaminophen  500 mg Oral Q6H  . aspirin EC  325 mg Oral Q breakfast  . docusate sodium  100 mg Oral BID  . feeding supplement (ENSURE ENLIVE)  237 mL Oral BID BM  . folic acid  086 mcg Oral Daily  . furosemide  40 mg Intravenous BID  . multivitamin with minerals  1 tablet Oral Daily  . pravastatin  10 mg Oral Daily  . pyridoxine  100 mg Oral Daily  . Rivastigmine 6 mg capsule  6 mg Oral BID   Continuous Infusions: . sodium chloride 75 mL/hr at 09/30/18 0723  . lactated ringers 35 mL/hr at 09/29/18 1354    Principal Problem:   Hip fracture (HCC) Active Problems:   Dementia (HCC)   CKD (chronic kidney disease), stage III (HCC)   PAD (peripheral artery disease) (HCC)   Acute pulmonary edema (HCC)   Acute on chronic respiratory failure with hypoxia (HCC)   LOS: 2 days

## 2018-09-30 NOTE — Evaluation (Signed)
Physical Therapy Evaluation Patient Details Name: Kathryn [name] MRN: [id number] DOB: 1934/06/16 Today's Date: 09/30/2018   History of Present Illness  Patient is an 85 y/o female with a past medical history of dementia, recent UTI, depression and anxiety.  Presenting to the ED on 09/28/18 s/p fall at home with resultant L hip fracture. L hip hemiarthroplasty on 09/29/2018.    Clinical Impression  Patient admitted with the above listed diagnosis. Husband providing prior function as patient is a poor historian. Patient ambulatory without AD prior to admission, however does have a history of falls. Patient today requiring Mod/Max A +2 for all bed level mobility and transfers - limited ability to follow commands due to baseline dementia. Patient to benefit from short term rehab at discharge to progress safe functional mobility prior to return home. PT to follow acutely.     Follow Up Recommendations SNF;Supervision/Assistance - 24 hour    Equipment Recommendations  Other (comment)(defer)    Recommendations for Other Services       Precautions / Restrictions Precautions Precautions: Fall;Posterior Hip Precaution Comments: reviewed with patient and husband - wrote on room board Restrictions Weight Bearing Restrictions: Yes LLE Weight Bearing: Weight bearing as tolerated      Mobility  Bed Mobility Overal bed mobility: Needs Assistance Bed Mobility: Supine to Sit     Supine to sit: Max assist;+2 for physical assistance     General bed mobility comments: use of bed pad; patient with limited ability to follow commands  Transfers Overall transfer level: Needs assistance Equipment used: 2 person hand held assist Transfers: Sit to/from [employer] Transfers Sit to Stand: Mod assist;+2 physical assistance Stand pivot transfers: Mod assist;+2 physical assistance       General transfer comment: patient pushing up with R UE; able to bear weight through B LE; attempted to step  for pivot - requires guidance from PT  Ambulation/Gait             General Gait Details: deferred  Stairs            Wheelchair Mobility    Modified Rankin (Stroke Patients Only)       Balance Overall balance assessment: Needs assistance Sitting-balance support: Bilateral upper extremity supported;Feet supported Sitting balance-Leahy Scale: Fair Sitting balance - Comments: use of B UE to support self in sitting   Standing balance support: Bilateral upper extremity supported;During functional activity Standing balance-Leahy Scale: Poor                               Pertinent Vitals/Pain Pain Assessment: Faces Faces Pain Scale: Hurts even more Pain Location: L hip with mobility Pain Descriptors / Indicators: Guarding;Grimacing Pain Intervention(s): Limited activity within patient's tolerance;Monitored during session;Repositioned    Home Living Family/patient expects to be discharged to:: Private residence Living Arrangements: Spouse/significant other Available Help at Discharge: Family;Available 24 hours/day Type of Home: House Home Access: Stairs to enter   [employer] of Steps: 1 Home Layout: Two level;Able to live on main level with bedroom/bathroom Home Equipment: Other (comment)(grab bars at sunroom stairs)      Prior Function Level of Independence: Needs assistance   Gait / Transfers Assistance Needed: wallking with no AD  ADL's / Homemaking Assistance Needed: ADLs with supervision A; IADLs provided        Hand Dominance        Extremity/Trunk Assessment   Upper Extremity Assessment Upper Extremity Assessment: Defer to OT  evaluation    Lower Extremity Assessment Lower Extremity Assessment: Generalized weakness;LLE deficits/detail LLE Deficits / Details: does not attempt to actively move; grimacing with PROM to come to sitting    Cervical / Trunk Assessment Cervical / Trunk Assessment: Normal  Communication    Communication: No difficulties  Cognition Arousal/Alertness: Awake/alert Behavior During Therapy: WFL for tasks assessed/performed Overall Cognitive Status: History of cognitive impairments - at baseline                                        General Comments General comments (skin integrity, edema, etc.): husband present and supportive    Exercises     Assessment/Plan    PT Assessment Patient needs continued PT services  PT Problem List Decreased strength;Decreased activity tolerance;Decreased range of motion;Decreased balance;Decreased mobility;Decreased knowledge of use of DME;Decreased safety awareness       PT Treatment Interventions DME instruction;Gait training;Stair training;Functional mobility training;Therapeutic activities;Therapeutic exercise;Balance training;Neuromuscular re-education;Patient/family education    PT Goals (Current goals can be found in the Care Plan section)  Acute Rehab PT Goals Patient Stated Goal: none stated PT Goal Formulation: With patient Time For Goal Achievement: 10/14/18 Potential to Achieve Goals: Fair    Frequency Min 2X/week   Barriers to discharge        Co-evaluation PT/OT/SLP Co-Evaluation/Treatment: Yes Reason for Co-Treatment: Complexity of the patient's impairments (multi-system involvement);Necessary to address cognition/behavior during functional activity;For patient/therapist safety;To address functional/ADL transfers PT goals addressed during session: Mobility/safety with mobility;Balance;Strengthening/ROM         AM-PAC PT "6 Clicks" Mobility  Outcome Measure Help needed turning from your back to your side while in a flat bed without using bedrails?: A Lot Help needed moving from lying on your back to sitting on the side of a flat bed without using bedrails?: A Lot Help needed moving to and from a bed to a chair (including a wheelchair)?: Total Help needed standing up from a chair using your arms  (e.g., wheelchair or bedside chair)?: A Lot Help needed to walk in hospital room?: Total Help needed climbing 3-5 steps with a railing? : Total 6 Click Score: 9    End of Session Equipment Utilized During Treatment: Gait belt Activity Tolerance: Patient tolerated treatment well Patient left: in chair;with call bell/phone within reach;with chair alarm set;with family/visitor present;with nursing/sitter in room Nurse Communication: Mobility status PT Visit Diagnosis: Unsteadiness on feet (R26.81);Other abnormalities of gait and mobility (R26.89);Muscle weakness (generalized) (M62.81);History of falling (Z91.81)    Time: 0630-1601 PT Time Calculation (min) (ACUTE ONLY): 27 min   Charges:   PT Evaluation $PT Eval Moderate Complexity: 1 Mod          Lanney Gins, PT, DPT Supplemental Physical Therapist 09/30/18 10:36 AM Pager: [phone] Office: [phone]

## 2018-09-30 NOTE — Evaluation (Signed)
Occupational Therapy Evaluation Patient Details Name: Kathryn [name] MRN: [id number] DOB: 10-23-1933 Today's Date: 09/30/2018    History of Present Illness Patient is an 85 y/o female with a past medical history of dementia, recent UTI, depression and anxiety.  Presenting to the ED on 09/28/18 s/p fall at home with resultant L hip fracture. L hip hemiarthroplasty on 09/29/2018.   Clinical Impression   Pt PTA: living with spouse who provides 24/7 care as pt with severe dementia at baseline. Pt goes to adult day program 2x weekly for 4-5 hours per spouse. Pt currently limited by pain in L hip, decreased strength and decreased mobility. Pt initiating movement in L LE, but unable to take more than 1 step without maxA+2 for stand pivot. Pt minA for UB ADL and maxA for LB ADL.  Posterior hip precautions verbalized and pt's spouse in room for care. Pt would benefit from continued OT skilled services for ADL, mobility and safety in SNF setting. OT to follow acutely for transfers, ADL and mobility.    Follow Up Recommendations  SNF;Supervision/Assistance - 24 hour    Equipment Recommendations  None recommended by OT    Recommendations for Other Services       Precautions / Restrictions Precautions Precautions: Fall;Posterior Hip Precaution Comments: reviewed with patient and husband - wrote on room board Restrictions Weight Bearing Restrictions: Yes LLE Weight Bearing: Weight bearing as tolerated      Mobility Bed Mobility Overal bed mobility: Needs Assistance Bed Mobility: Supine to Sit     Supine to sit: Max assist;+2 for physical assistance     General bed mobility comments: use of bed pad to move hips and elevate trunk; patient with limited ability to follow commands  Transfers Overall transfer level: Needs assistance Equipment used: 2 person hand held assist Transfers: Sit to/from [employer] Transfers Sit to Stand: Mod assist;+2 physical assistance Stand pivot  transfers: Mod assist;+2 physical assistance       General transfer comment: patient pushing up with R UE; able to bear weight through B LE; attempting to take steps with minimal initiation    Balance Overall balance assessment: Needs assistance   Sitting balance-Leahy Scale: Fair       Standing balance-Leahy Scale: Poor                             ADL either performed or assessed with clinical judgement   ADL Overall ADL's : Needs assistance/impaired Eating/Feeding: Supervision/ safety;Set up;Sitting   Grooming: Minimal assistance;Sitting;Standing   Upper Body Bathing: Sitting;Minimal assistance   Lower Body Bathing: Maximal assistance;Sitting/lateral leans;Sit to/from stand   Upper Body Dressing : Minimal assistance   Lower Body Dressing: Maximal assistance;Sit to/from stand;Sitting/lateral leans   Toilet Transfer: Moderate assistance   Toileting- Clothing Manipulation and Hygiene: Moderate assistance       Functional mobility during ADLs: Maximal assistance;+2 for physical assistance;+2 for safety/equipment(hand held) General ADL Comments: set-upA for feeding; otherwise, pt min to maxA for all other ADL due to dementia     Vision Baseline Vision/History: No visual deficits       Perception     Praxis      Pertinent Vitals/Pain Pain Assessment: Faces Faces Pain Scale: Hurts even more Pain Location: L hip with mobility Pain Descriptors / Indicators: Guarding;Grimacing     Hand Dominance     Extremity/Trunk Assessment Upper Extremity Assessment Upper Extremity Assessment: Generalized weakness   Lower Extremity Assessment Lower Extremity  Assessment: Generalized weakness;Defer to PT evaluation LLE Deficits / Details: difficulty initiating gait   Cervical / Trunk Assessment Cervical / Trunk Assessment: Normal   Communication Communication Communication: No difficulties   Cognition Arousal/Alertness: Awake/alert Behavior During Therapy:  WFL for tasks assessed/performed Overall Cognitive Status: History of cognitive impairments - at baseline                                     General Comments  spouse present. dementia oriented to self at baseline.    Exercises     Shoulder Instructions      Home Living Family/patient expects to be discharged to:: Private residence Living Arrangements: Spouse/significant other Available Help at Discharge: Family;Available 24 hours/day Type of Home: House Home Access: Stairs to enter [employer] of Steps: 1   Home Layout: Two level;Able to live on main level with bedroom/bathroom Alternate Level Stairs-Number of Steps: 2(to sun room)   Bathroom Shower/Tub: Occupational psychologist: Standard     Home Equipment: Other (comment)(grab bars at sunroom stairs)          Prior Functioning/Environment Level of Independence: Needs assistance  Gait / Transfers Assistance Needed: wallking with no AD ADL's / Homemaking Assistance Needed: ADLs with supervision A; IADLs provided            OT Problem List: Decreased strength;Decreased activity tolerance;Impaired balance (sitting and/or standing);Decreased safety awareness;Pain      OT Treatment/Interventions: Self-care/ADL training;Therapeutic exercise;Neuromuscular education;Energy conservation;Therapeutic activities;Patient/family education;Balance training    OT Goals(Current goals can be found in the care plan section) Acute Rehab OT Goals Patient Stated Goal: none stated OT Goal Formulation: With patient/family Time For Goal Achievement: 10/14/18 Potential to Achieve Goals: Good ADL Goals Pt Will Perform Grooming: with set-up Pt Will Perform Lower Body Dressing: with supervision Pt Will Transfer to Toilet: with min guard assist Additional ADL Goal #1: Pt will perform ADL functional mobility and transfers with minguardA  OT Frequency: Min 2X/week   Barriers to D/C:             Co-evaluation PT/OT/SLP Co-Evaluation/Treatment: Yes Reason for Co-Treatment: Complexity of the patient's impairments (multi-system involvement);To address functional/ADL transfers   OT goals addressed during session: ADL's and self-care      AM-PAC OT "6 Clicks" Daily Activity     Outcome Measure Help from another person eating meals?: None Help from another person taking care of personal grooming?: A Little Help from another person toileting, which includes using toliet, bedpan, or urinal?: Total Help from another person bathing (including washing, rinsing, drying)?: A Lot Help from another person to put on and taking off regular upper body clothing?: A Little Help from another person to put on and taking off regular lower body clothing?: A Lot 6 Click Score: 15   End of Session Equipment Utilized During Treatment: Gait belt Nurse Communication: Mobility status;Precautions  Activity Tolerance: Patient limited by pain;Treatment limited secondary to medical complications (Comment) Patient left: in chair;with call bell/phone within reach;with chair alarm set;with family/visitor present  OT Visit Diagnosis: Unsteadiness on feet (R26.81);Muscle weakness (generalized) (M62.81);Pain                Time: 7341-9379 OT Time Calculation (min): 31 min Charges:  OT General Charges $OT Visit: 1 Visit OT Evaluation $OT Eval Moderate Complexity: 1 Mod  Kathryn [name]) Kathryn [name] OTR/L Acute Rehabilitation Services Pager: [phone] Office: [phone]  Kathryn [name]  09/30/2018, 3:06 PM

## 2018-09-30 NOTE — Progress Notes (Addendum)
   Subjective: 1 Day Post-Op Procedure(s) (LRB): left hip (HEMIARTHROPLASTY) (Left) Patient reports pain as mild.    Objective: Vital signs in last 24 hours: Temp:  [97.3 F (36.3 C)-99.7 F (37.6 C)] 98.9 F (37.2 C) (02/27 0508) Pulse Rate:  [68-97] 88 (02/27 0508) Resp:  [16-29] 26 (02/26 1840) BP: (110-150)/(45-83) 118/52 (02/27 0508) SpO2:  [91 %-100 %] 96 % (02/27 0508) Weight:  [60.9 kg] 60.9 kg (02/27 0508)  Intake/Output from previous day: 02/26 0701 - 02/27 0700 In: 2020.7 [P.O.:480; I.V.:1440.7; IV Piggyback:100] Out: 800 [Urine:750; Blood:50] Intake/Output this shift: No intake/output data recorded.  Recent Labs    09/28/18 1339 09/29/18 0433 09/30/18 0251  HGB 14.7 13.3 11.3*   Recent Labs    09/29/18 0433 09/30/18 0251  WBC 14.6* 9.8  RBC 4.35 3.59*  HCT 41.7 34.5*  PLT 245 212   Recent Labs    09/29/18 0433 09/30/18 0251  NA 141 136  K 3.9 3.8  CL 102 100  CO2 27 27  BUN 20 19  CREATININE 1.24* 1.11*  GLUCOSE 125* 145*  CALCIUM 8.6* 8.1*   Recent Labs    09/28/18 1339  INR 1.0    Neurologically intact Pelvis Portable  Result Date: 09/29/2018 CLINICAL DATA:  Left hip replacement EXAM: PORTABLE PELVIS 1-2 VIEWS COMPARISON:  Left hip CT 09/28/2018 FINDINGS: There is a left hip hemiarthroplasty now present. Alignment is normal. There is expected postoperative gas overlying the left greater trochanter. IMPRESSION: Expected postoperative appearance of left hip hemiarthroplasty. Electronically Signed   By: Ulyses Jarred M.D.   On: 09/29/2018 18:44    Assessment/Plan: 1 Day Post-Op Procedure(s) (LRB): left hip (HEMIARTHROPLASTY) (Left) Up with therapy, WBAT.   SNF  Discussed good echo result with husband who requested results.   Marybelle Killings 09/30/2018, 7:57 AM

## 2018-09-30 NOTE — Evaluation (Addendum)
Clinical/Bedside Swallow Evaluation Patient Details  Name: Kathryn [name] MRN: [id number] Date of Birth: 07-07-1934  Today's Date: 09/30/2018 Time: SLP Start Time (ACUTE ONLY): 1452 SLP Stop Time (ACUTE ONLY): 1504 SLP Time Calculation (min) (ACUTE ONLY): 12 min  Past Medical History:  Past Medical History:  Diagnosis Date  . Alzheimer's dementia ([place])   . Anxiety   . Headache   . Hypertension    Past Surgical History:  Past Surgical History:  Procedure Laterality Date  . BREAST BIOPSY    . HIP ARTHROPLASTY Left 09/29/2018   Procedure: left hip (HEMIARTHROPLASTY);  Surgeon: Marybelle Killings, MD;  Location: [facility];  Service: Orthopedics;  Laterality: Left;   HPI:  Patient is an 85 y/o female with a past medical history of dementia, recent UTI, depression and anxiety.  Presenting to the ED on 09/28/18 s/p fall at home with resultant L hip fracture. L hip hemiarthroplasty on 09/29/2018. CXR Mild bilateral interstitial edema.   Assessment / Plan / Recommendation Clinical Impression  Pt seen for dysphagia evaluation- no family present. RN reported husband has been assisting with feeding; without; RN stated husband reported pt eats fast and therefore he assists her to slow pace. Extra time, verbal and tactile cues needed to initiate proper use of straw (common behavior in pt's with dementia). There were no indications of aspiration and pt orally managed piece graham cracker. She is at increased risk for oral dysphagia, malnutrition given dementia and may need modification of diet texture if holding or pocketing behaviors ovserved. For now, continue with regular texture, thin liquid, pills crushed and full assistance. ST will follow for safety with po's and education with spouse.     SLP Visit Diagnosis: Dysphagia, unspecified (R13.10)    Aspiration Risk  Mild aspiration risk    Diet Recommendation Regular;Thin liquid   Liquid Administration via: Cup;Straw Medication Administration: Whole meds  with puree Supervision: Patient able to self feed;Staff to assist with self feeding Compensations: Slow rate;Small sips/bites;Minimize environmental distractions Postural Changes: Seated upright at 90 degrees    Other  Recommendations Oral Care Recommendations: Oral care BID   Follow up Recommendations Min x 2 week/ 2 weeks     Frequency and Duration            Prognosis        Swallow Study   General HPI: Patient is an 85 y/o female with a past medical history of dementia, recent UTI, depression and anxiety.  Presenting to the ED on 09/28/18 s/p fall at home with resultant L hip fracture. L hip hemiarthroplasty on 09/29/2018. CXR Mild bilateral interstitial edema. Type of Study: Bedside Swallow Evaluation Previous Swallow Assessment: (none) Diet Prior to this Study: Regular;Thin liquids Temperature Spikes Noted: No Respiratory Status: Room air History of Recent Intubation: No Behavior/Cognition: Alert;Cooperative;Pleasant mood;Confused;Requires cueing Oral Cavity Assessment: Other (comment)(lingual candidias) Oral Care Completed by SLP: No Oral Cavity - Dentition: Adequate natural dentition Vision: Functional for self-feeding Self-Feeding Abilities: Able to feed self;Needs assist Patient Positioning: Upright in bed Baseline Vocal Quality: Low vocal intensity Volitional Cough: Weak Volitional Swallow: Unable to elicit    Oral/Motor/Sensory Function Overall Oral Motor/Sensory Function: Within functional limits   Ice Chips Ice chips: Not tested   Thin Liquid Thin Liquid: Impaired Presentation: Straw Oral Phase Impairments: Reduced labial seal Pharyngeal  Phase Impairments: (none)    Nectar Thick Nectar Thick Liquid: Not tested   Honey Thick Honey Thick Liquid: Not tested   Puree Puree: Not tested   Solid  Solid: Within functional limits      Houston Siren 09/30/2018,3:19 PM   Orbie Pyo [place].Ed Risk analyst  [phone] Office [phone]

## 2018-09-30 NOTE — Clinical Social Work Note (Signed)
Requested documents uploaded into [place] [employer] for PASARR review.  Dayton Scrape, [place]

## 2018-09-30 NOTE — Clinical Social Work Note (Signed)
Clinical Social Work Assessment  Patient Details  Name: Kathryn [name] MRN: [id number] Date of Birth: Dec 28, 1933  Date of referral:  09/30/18               Reason for consult:  Discharge Planning                Permission sought to share information with:  Facility Sport and exercise psychologist, Family Supports Permission granted to share information::  Yes, Verbal Permission Granted  Name::     [place]::  [facility] SNFs  Relationship::  Husband  Contact Information:  [phone]  Housing/Transportation Living arrangements for the past 2 months:  [place] of Information:  Spouse Patient Interpreter Needed:  None Criminal Activity/Legal Involvement Pertinent to Current Situation/Hospitalization:  No - Comment as needed Significant Relationships:  Spouse Lives with:  Spouse Do you feel safe going back to the place where you live?  No Need for family participation in patient care:  Yes (Comment)(Patient with dementia)  Care giving concerns:  Patient from home with spouse. Patient currently goes to adult daycare at [facility]     Social Worker assessment / plan:  SW Intern met with patient and patients spouse at bedside. Patient was asleep, and disoriented x4 due to dementia. Therefore, discharge plan was discussed with spouse. Spouse is agreeable to go to SNF, but states he there are many facilities he is unwilling to choose. Patients spouse participates in care giver support groups at [facility] and [facility], and states that he has heard many bad things about facilities. Patient and spouse do not live at [place], but receive services from them at the memory care day center.  CSW to send out referrals to [facility], and follow up with bed offers. An insurance preauthorization will be required, taking approximately 1 day. Additionally, patients PASSAR will likely require manual review due to dementia.    CSW/SW intern to continue support with discharge.  Employment status:  Retired Nurse, mental health ) PT Recommendations:  [place] / Referral to community resources:  [place]  Patient/Family's Response to care:  Patients spouse appreciative of support with placing patient in rehab.   Patient/Family's Understanding of and Emotional Response to Diagnosis, Current Treatment, and Prognosis:  Patients spouse with good understanding of diagnosis, current treatment, and prognosis and is actively involved with support groups and local services.   Emotional Assessment Appearance:  Appears stated age Attitude/Demeanor/Rapport:  Unable to Assess Affect (typically observed):  Unable to Assess Orientation:  (Disoriented x4) Alcohol / Substance use:  Not Applicable Psych involvement (Current and /or in the community):  No (Comment)  Discharge Needs  Concerns to be addressed:  Discharge Planning Concerns Readmission within the last 30 days:  No Current discharge risk:  None Barriers to Discharge:  Insurance Authorization, [place] (PASSAR manual review)   Arlis Porta, [place] Work 09/30/2018, 11:11 AM

## 2018-10-01 ENCOUNTER — Encounter (HOSPITAL_COMMUNITY): Payer: Self-pay | Admitting: *Deleted

## 2018-10-01 LAB — RENAL FUNCTION PANEL
Albumin: 2.9 g/dL — ABNORMAL LOW (ref 3.5–5.0)
Anion gap: 11 (ref 5–15)
BUN: 23 mg/dL (ref 8–23)
CO2: 30 mmol/L (ref 22–32)
Calcium: 8.4 mg/dL — ABNORMAL LOW (ref 8.9–10.3)
Chloride: 98 mmol/L (ref 98–111)
Creatinine, Ser: 1.19 mg/dL — ABNORMAL HIGH (ref 0.44–1.00)
GFR calc Af Amer: 49 mL/min — ABNORMAL LOW (ref >60)
GFR calc non Af Amer: 42 mL/min — ABNORMAL LOW (ref >60)
Glucose, Bld: 139 mg/dL — ABNORMAL HIGH (ref 70–99)
POTASSIUM: 3.4 mmol/L — AB (ref 3.5–5.1)
Phosphorus: 2.6 mg/dL (ref 2.5–4.6)
Sodium: 139 mmol/L (ref 135–145)

## 2018-10-01 LAB — CBC
HEMATOCRIT: 34.2 % — AB (ref 36.0–46.0)
Hemoglobin: 11.3 g/dL — ABNORMAL LOW (ref 12.0–15.0)
MCH: 31.1 pg (ref 26.0–34.0)
MCHC: 33 g/dL (ref 30.0–36.0)
MCV: 94.2 fL (ref 80.0–100.0)
Platelets: 193 10*3/uL (ref 150–400)
RBC: 3.63 MIL/uL — ABNORMAL LOW (ref 3.87–5.11)
RDW: 12.4 % (ref 11.5–15.5)
WBC: 12.6 10*3/uL — ABNORMAL HIGH (ref 4.0–10.5)
nRBC: 0 % (ref 0.0–0.2)

## 2018-10-01 LAB — URINALYSIS, ROUTINE W REFLEX MICROSCOPIC
Bilirubin Urine: NEGATIVE
Glucose, UA: NEGATIVE mg/dL
HGB URINE DIPSTICK: NEGATIVE
Ketones, ur: NEGATIVE mg/dL
Leukocytes,Ua: NEGATIVE
Nitrite: NEGATIVE
Protein, ur: NEGATIVE mg/dL
Specific Gravity, Urine: 1.017 (ref 1.005–1.030)
pH: 5 (ref 5.0–8.0)

## 2018-10-01 MED ORDER — ASPIRIN 325 MG PO TBEC: 325.0000 mg | DELAYED_RELEASE_TABLET | Freq: Every day | ORAL | 0 refills | Status: DC

## 2018-10-01 MED ORDER — SENNOSIDES-DOCUSATE SODIUM 8.6-50 MG PO TABS: 1.0000 | ORAL_TABLET | Freq: Every evening | ORAL | Status: AC | PRN

## 2018-10-01 MED ORDER — FUROSEMIDE 40 MG PO TABS
40.0000 mg | ORAL_TABLET | Freq: Every day | ORAL | Status: DC
  Administered 2018-10-01 – 2018-10-06 (×6): 40 mg via ORAL
  Filled 2018-10-01 (×6): qty 1

## 2018-10-01 MED ORDER — HYDROCODONE-ACETAMINOPHEN 5-325 MG PO TABS: 1.0000 | ORAL_TABLET | ORAL | 0 refills | Status: DC | PRN

## 2018-10-01 MED ORDER — POLYETHYLENE GLYCOL 3350 17 G PO PACK: 17.0000 g | PACK | Freq: Every day | ORAL | 0 refills | Status: DC | PRN

## 2018-10-01 NOTE — Social Work (Addendum)
12:57 pm Patient's [employer] auth for SNF is under physician review. HTA would like to see patient work with therapy again and then will make determination.  10:49 am PASRR approved: [id number] E valid 2/28 - 10/31/18. Awaiting insurance auth, but will need a facility choice. Met with patient's spouse at bedside today. He is reviewing the SNF bed offers. He declines to choose one today, and states he needs to discuss it with their elder law attorney first. CSW to follow.   9:01 am Patient's PASRR is pending. Clinicals were uploaded to [place] [employer] yesterday.  Started [employer] authorization this morning. Awaiting determination.  Insurance auth and [place] both needed before patient can admit to SNF. Will follow up with patient's spouse for SNF bed choice today.  Estanislado Emms, LCSW [phone]

## 2018-10-01 NOTE — Telephone Encounter (Signed)
Spoke with patient's husband and gave directions per Dr. [name]. Kathryn [name], verbalized understanding. Mr. Haberle has some concerns about the care that his wife is receiving after the hip surgery and would like for Dr. [name] to give him a call at: [phone] to advise him on what he should do.

## 2018-10-01 NOTE — Progress Notes (Signed)
Purplish ecchymotic area on sacrum. Will consult WOC.

## 2018-10-01 NOTE — Discharge Instructions (Signed)
INSTRUCTIONS AFTER JOINT REPLACEMENT   o Remove items at home which could result in a fall. This includes throw rugs or furniture in walking pathways o ICE to the affected joint every three hours while awake for 30 minutes at a time, for at least the first 3-5 days, and then as needed for pain and swelling.  Continue to use ice for pain and swelling. You may notice swelling that will progress down to the foot and ankle.  This is normal after surgery.  Elevate your leg when you are not up walking on it.   o Continue to use the breathing machine you got in the hospital (incentive spirometer) which will help keep your temperature down.  It is common for your temperature to cycle up and down following surgery, especially at night when you are not up moving around and exerting yourself.  The breathing machine keeps your lungs expanded and your temperature down.   DIET:  As you were doing prior to hospitalization, we recommend a well-balanced diet.  DRESSING / WOUND CARE / SHOWERING  You may change your dressing 3-5 days after surgery.  Then change the dressing every day with sterile gauze.  Please use good hand washing techniques before changing the dressing.  Do not use any lotions or creams on the incision until instructed by your surgeon. RN to perform daily wound checks.   ACTIVITY  o Increase activity slowly as tolerated, but follow the weight bearing instructions below.   o No driving for 6 weeks or until further direction given by your physician.  You cannot drive while taking narcotics.  o No lifting or carrying greater than 10 lbs. until further directed by your surgeon. o Avoid periods of inactivity such as sitting longer than an hour when not asleep. This helps prevent blood clots.  o You may return to work once you are authorized by your doctor.     WEIGHT BEARING   Weight bearing as tolerated with assist device (walker, cane, etc) as directed, use it as long as suggested by your  surgeon or therapist, typically at least 4-6 weeks.   EXERCISES  Results after joint replacement surgery are often greatly improved when you follow the exercise, range of motion and muscle strengthening exercises prescribed by your doctor. Safety measures are also important to protect the joint from further injury. Any time any of these exercises cause you to have increased pain or swelling, decrease what you are doing until you are comfortable again and then slowly increase them. If you have problems or questions, call your caregiver or physical therapist for advice.   Rehabilitation is important following a joint replacement. After just a few days of immobilization, the muscles of the leg can become weakened and shrink (atrophy).  These exercises are designed to build up the tone and strength of the thigh and leg muscles and to improve motion. Often times heat used for twenty to thirty minutes before working out will loosen up your tissues and help with improving the range of motion but do not use heat for the first two weeks following surgery (sometimes heat can increase post-operative swelling).   These exercises can be done on a training (exercise) mat, on the floor, on a table or on a bed. Use whatever works the best and is most comfortable for you.    Use music or television while you are exercising so that the exercises are a pleasant break in your day. This will make your life better  with the exercises acting as a break in your routine that you can look forward to.   Perform all exercises about fifteen times, three times per day or as directed.  You should exercise both the operative leg and the other leg as well. ° °Exercises include: °  °• Quad Sets - Tighten up the muscle on the front of the thigh (Quad) and hold for 5-10 seconds.   °• Straight Leg Raises - With your knee straight (if you were given a brace, keep it on), lift the leg to 60 degrees, hold for 3 seconds, and slowly lower the leg.   Perform this exercise against resistance later as your leg gets stronger.  °• Leg Slides: Lying on your back, slowly slide your foot toward your buttocks, bending your knee up off the floor (only go as far as is comfortable). Then slowly slide your foot back down until your leg is flat on the floor again.  °• Angel Wings: Lying on your back spread your legs to the side as far apart as you can without causing discomfort.  °• Hamstring Strength:  Lying on your back, push your heel against the floor with your leg straight by tightening up the muscles of your buttocks.  Repeat, but this time bend your knee to a comfortable angle, and push your heel against the floor.  You may put a pillow under the heel to make it more comfortable if necessary.  ° °A rehabilitation program following joint replacement surgery can speed recovery and prevent re-injury in the future due to weakened muscles. Contact your doctor or a physical therapist for more information on knee rehabilitation.  ° ° °CONSTIPATION ° °Constipation is defined medically as fewer than three stools per week and severe constipation as less than one stool per week.  Even if you have a regular bowel pattern at home, your normal regimen is likely to be disrupted due to multiple reasons following surgery.  Combination of anesthesia, postoperative narcotics, change in appetite and fluid intake all can affect your bowels.  ° °YOU MUST use at least one of the following options; they are listed in order of increasing strength to get the job done.  They are all available over the counter, and you may need to use some, POSSIBLY even all of these options:   ° °Drink plenty of fluids (prune juice may be helpful) and high fiber foods °Colace 100 mg by mouth twice a day  °Senokot for constipation as directed and as needed Dulcolax (bisacodyl), take with full glass of water  °Miralax (polyethylene glycol) once or twice a day as needed. ° °If you have tried all these things and  are unable to have a bowel movement in the first 3-4 days after surgery call either your surgeon or your primary doctor.   ° °If you experience loose stools or diarrhea, hold the medications until you stool forms back up.  If your symptoms do not get better within 1 week or if they get worse, check with your doctor.  If you experience "the worst abdominal pain ever" or develop nausea or vomiting, please contact the office immediately for further recommendations for treatment. ° ° °ITCHING:  If you experience itching with your medications, try taking only a single pain pill, or even half a pain pill at a time.  You can also use Benadryl over the counter for itching or also to help with sleep.  ° °TED HOSE STOCKINGS:  Use stockings on both legs until   for at least 2 weeks or as directed by physician office. They may be removed at night for sleeping.  MEDICATIONS:  See your medication summary on the After Visit Summary that nursing will review with you.  You may have some home medications which will be placed on hold until you complete the course of blood thinner medication.  It is important for you to complete the blood thinner medication as prescribed.  PRECAUTIONS:  If you experience chest pain or shortness of breath - call 911 immediately for transfer to the hospital emergency department.   If you develop a fever greater that 101 F, purulent drainage from wound, increased redness or drainage from wound, foul odor from the wound/dressing, or calf pain - CONTACT YOUR SURGEON.                                                   FOLLOW-UP APPOINTMENTS:  If you do not already have a post-op appointment, please call the office for an appointment to be seen by your surgeon.  Guidelines for how soon to be seen are listed in your After Visit Summary, but are typically between 1-4 weeks after surgery.  OTHER INSTRUCTIONS:   Knee Replacement:  Do not place pillow under knee, focus on keeping the knee straight  while resting. CPM instructions: 0-90 degrees, 2 hours in the morning, 2 hours in the afternoon, and 2 hours in the evening. Place foam block, curve side up under heel at all times except when in CPM or when walking.  DO NOT modify, tear, cut, or change the foam block in any way.  MAKE SURE YOU:   Understand these instructions.   Get help right away if you are not doing well or get worse.    Thank you for letting us be a part of your medical care team.  It is a privilege we respect greatly.  We hope these instructions will help you stay on track for a fast and full recovery!

## 2018-10-01 NOTE — Care Management Important Message (Signed)
Important Message  Patient Details  Name: Kathryn [name] MRN: [id number] Date of Birth: 06-Dec-1933   Medicare Important Message Given:  Yes    [name] 10/01/2018, 2:21 PM

## 2018-10-01 NOTE — Progress Notes (Signed)
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 at [facility] Severe dementia (MMSE 11/30 on last check 09/2017) with unstable gait and recurrent falls Bipolar Neuropathic pain on gabapentin Hyperlipidemia  Just seen 09/24/2018 and felt to probably have had a mild cystitis PCP noted a decline in her functional state she is however not doing as well as she had been and seems to be declining (now passing urine on herself whereas previously she would be understanding and coherent enough to pull down her underwear)  Patient was apparently standing and turned and fell onto her left side with loss of balance and unlikely syncope although this is not completely clear given patient's difficulty giving h/o She was found to be hypoxic in the 70s initially placed on oxygen-chest x-ray showed pulmonary edema  She was given a dose of Lasix with resolution orthopedics was consulted who felt she could go to surgery   A & Plan Post op fever, ? Infection  Tmax 101.7 overnight-elevated wbc  Husband relates recent infections  Concern for UTI--Needs iu/o cath, Ua and CUlt  Monitor today for further fever, if +, would start Antibiotics  UA on admission was likely sterilized 2/2 recent Rx Left hip fracture status post left press-fit hemiarthroplasty 09/29/2018 per Ortho in terms of DVT proph [placed on 325 of aspirin needed until 10/27/2018], WB status, Has posterior hip precautions Post op management etc Expected postoperative and dilutional anemia  On return a.m. trends transfusion threshold below 7 Pulmonary edema likely secondary to undiagnosed heart failure Echocardiogram shows EF 60-65% Patient received 1 dose of IV Lasix 2/25 Continue with IV Lasix 40 twice daily given when taking her off oxygen she drops her sats below 90 cxr 2.27  done Leukocytosis Solved. History of dementia  Continue Exelon  Post op would be cautious with opiates, and would hold gabapentin---would completely discontinue gabapentin in the outpatient setting Chronic kidney disease stage II  Trends are stable at this time Peripheral arterial disease  Cont ASa and plavix Moderate malnutrition BMI 22.7   Lovenox D/w husband at the bedside and asssesed concern now for infection--working up Will be d/c over weekend if possible, otherwise might be Monday pending SNF ability to take patient  Verlon Au, MD  [facility] [place] [email or web address] 7PM-7AM contact night coverage as above 10/01/2018, 8:59 AM  LOS: 3 days   Consultants:  Ortho  Procedures:  None yet  Antimicrobials:  none  Interval history/Subjective:  Fever reported by husband overnight No cp Cannot get ROS She seems to be at baseline per Husband but he does express his concerns about fever and recurrences of UTI  Objective:  Vitals:  Vitals:   10/01/18 0411 10/01/18 0814  BP: (!) 104/57 101/68  Pulse: 95 91  Resp:  18  Temp: 98.4 F (36.9 C) 98.7 F (37.1 C)  SpO2: 96% 98%    Exam:  Awake pleasantly confused EOMI NCAT No pallor, no ict, no facial asymm--on oxygen 2 liters Chest clear Abdomen soft Large reinforce dressing over left lateral hip area  I have personally reviewed the following:  DATA   Labs:  Bun /creat 19/1.1 and stable  Hemoglobin down from 14.7 admission to 11.3--WBC now 12.6 form 9.8  Imaging studies: FINDINGS  Left Ventricle: The left ventricle has normal systolic function, with an ejection fraction of 60-65%. The cavity size was normal. There  is no increase in left ventricular wall thickness. Left ventricular diastology could not be evaluated due to  nondiagnostic images. Right Ventricle: The right ventricle has normal systolic function. The cavity was normal. There is no increase in right ventricular wall  thickness. Left Atrium: left atrial size was normal in size Right Atrium: right atrial size was normal in size. Interatrial Septum: No atrial level shunt detected by color flow Doppler. Increased thickness of the atrial septum sparing the fossa ovalis consistent with The interatrial septum appears to be lipomatous. Pericardium: There is no evidence of pericardial effusion. Mitral Valve: The mitral valve is normal in structure. Mitral valve regurgitation is not visualized by color flow Doppler. Tricuspid Valve: The tricuspid valve is normal in structure. Tricuspid valve regurgitation was not visualized by color flow Doppler. Aortic Valve: The aortic valve is tricuspid Moderate calcification of the aortic valve. Aortic valve regurgitation was not visualized by color flow Doppler. Pulmonic Valve: The pulmonic valve was normal in structure. Pulmonic valve regurgitation is not visualized by color flow Doppler. Venous: The inferior vena cava is normal in size with greater than 50% respiratory variability.  Medical tests:  n   Test discussed with performing physician:  n  Decision to obtain old records:  n  Review and summation of old records:  n  Scheduled Meds: . aspirin EC  325 mg Oral Q breakfast  . feeding supplement (ENSURE ENLIVE)  237 mL Oral BID BM  . folic acid  810 mcg Oral Daily  . multivitamin with minerals  1 tablet Oral Daily  . pravastatin  10 mg Oral Daily  . pyridoxine  100 mg Oral Daily  . Rivastigmine 6 mg capsule  6 mg Oral BID   Continuous Infusions: . lactated ringers 35 mL/hr at 09/29/18 1354    Principal Problem:   Hip fracture (HCC) Active Problems:   Dementia (HCC)   CKD (chronic kidney disease), stage III (HCC)   PAD (peripheral artery disease) (HCC)   Acute pulmonary edema (HCC)   Acute on chronic respiratory failure with hypoxia ([place])   LOS: 3 days

## 2018-10-02 LAB — CBC WITH DIFFERENTIAL/PLATELET
Abs Immature Granulocytes: 0.07 10*3/uL (ref 0.00–0.07)
BASOS ABS: 0 10*3/uL (ref 0.0–0.1)
Basophils Relative: 0 %
EOS ABS: 0.9 10*3/uL — AB (ref 0.0–0.5)
Eosinophils Relative: 7 %
HCT: 29.9 % — ABNORMAL LOW (ref 36.0–46.0)
Hemoglobin: 9.9 g/dL — ABNORMAL LOW (ref 12.0–15.0)
Immature Granulocytes: 1 %
Lymphocytes Relative: 18 %
Lymphs Abs: 2.2 10*3/uL (ref 0.7–4.0)
MCH: 31.4 pg (ref 26.0–34.0)
MCHC: 33.1 g/dL (ref 30.0–36.0)
MCV: 94.9 fL (ref 80.0–100.0)
Monocytes Absolute: 1 10*3/uL (ref 0.1–1.0)
Monocytes Relative: 8 %
Neutro Abs: 8.2 10*3/uL — ABNORMAL HIGH (ref 1.7–7.7)
Neutrophils Relative %: 66 %
PLATELETS: 197 10*3/uL (ref 150–400)
RBC: 3.15 MIL/uL — ABNORMAL LOW (ref 3.87–5.11)
RDW: 12.6 % (ref 11.5–15.5)
WBC: 12.3 10*3/uL — ABNORMAL HIGH (ref 4.0–10.5)
nRBC: 0 % (ref 0.0–0.2)

## 2018-10-02 LAB — BASIC METABOLIC PANEL
Anion gap: 9 (ref 5–15)
BUN: 24 mg/dL — ABNORMAL HIGH (ref 8–23)
CO2: 32 mmol/L (ref 22–32)
Calcium: 8 mg/dL — ABNORMAL LOW (ref 8.9–10.3)
Chloride: 95 mmol/L — ABNORMAL LOW (ref 98–111)
Creatinine, Ser: 1.04 mg/dL — ABNORMAL HIGH (ref 0.44–1.00)
GFR calc non Af Amer: 50 mL/min — ABNORMAL LOW (ref >60)
GFR, EST AFRICAN AMERICAN: 58 mL/min — AB (ref >60)
Glucose, Bld: 117 mg/dL — ABNORMAL HIGH (ref 70–99)
Potassium: 3.2 mmol/L — ABNORMAL LOW (ref 3.5–5.1)
Sodium: 136 mmol/L (ref 135–145)

## 2018-10-02 MED ORDER — CEPHALEXIN 500 MG PO CAPS
500.0000 mg | ORAL_CAPSULE | Freq: Two times a day (BID) | ORAL | Status: DC
  Administered 2018-10-02 – 2018-10-03 (×3): 500 mg via ORAL
  Filled 2018-10-02 (×3): qty 1

## 2018-10-02 MED ORDER — NITROFURANTOIN MONOHYD MACRO 100 MG PO CAPS: 100.0000 mg | ORAL_CAPSULE | Freq: Two times a day (BID) | ORAL | Status: DC

## 2018-10-02 NOTE — Progress Notes (Signed)
CSW rec'd call from [facility] ([facility])- since patient is not expected  to d/c until Monday , current insurance auth request closed. CSW will contact [facility] on Monday to restart authorization process.   Patient will need updated PT for insurance approval. RN and MD updated.    Thurmond Butts, MSW, [place] Social Worker [phone]

## 2018-10-02 NOTE — Progress Notes (Signed)
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 at [facility] Severe dementia (MMSE 11/30 on last check 09/2017) with unstable gait and recurrent falls Bipolar Neuropathic pain on gabapentin Hyperlipidemia  Just seen 09/24/2018 and felt to probably have had a mild cystitis PCP noted a decline in her functional state she is however not doing as well as she had been and seems to be declining (now passing urine on herself whereas previously she would be understanding and coherent enough to pull down her underwear)  Patient was apparently standing and turned and fell onto her left side with loss of balance and unlikely syncope although this is not completely clear given patient's difficulty giving h/o She was found to be hypoxic in the 70s initially placed on oxygen-chest x-ray showed pulmonary edema  She was given a dose of Lasix with resolution orthopedics was consulted who felt she could go to surgery   A & Plan Post op fever, ? Infection  Tmax 101.7 2/29-non sustained  UA on admission was likely sterilized 2/2 recent Rx Repeat Ua was neg Will Rx with Keflex for 3 days Acute urinary retention Needed in and out cath 2.28 Periodically occurs Will recheck and might need indwelling foley with cathter followed as OP Left hip fracture status post left press-fit hemiarthroplasty 09/29/2018 per Ortho in terms of DVT proph [placed on 325 of aspirin needed until 10/27/2018], WB status, Has posterior hip precautions  Post op management etc Expected postoperative and dilutional anemia  Stabilized  Recheck hemoglobin given slight drop  No reports drk tarry stool Pulmonary edema likely secondary to undiagnosed heart failure Echocardiogram shows EF 60-65% Patient received 1 dose of IV Lasix 2/25 Now on lasix 40 po od-changed from IV cxr  2.27 done Leukocytosis Solved. History of dementia  Continue Exelon  Post op would be cautious with opiates, completely discontinue gabapentin in the outpatient setting Chronic kidney disease stage II  Trends are stable at this time Peripheral arterial disease  Cont ASa and plavix Moderate malnutrition BMI 22.7   Lovenox D/w husband at the bedside and asssesed concern now for infection--working up Will be d/c Monday pending SNF ability to take patient  Kathryn Au, MD  [facility] [place] [email or web address] 7PM-7AM contact night coverage as above 10/02/2018, 10:27 AM  LOS: 4 days   Consultants:  Ortho  Procedures:  None yet  Antimicrobials:  none  Interval history/Subjective:  Awake pleasantly confused  No ROS can be attempted  Objective:  Vitals:  Vitals:   10/01/18 1658 10/01/18 2100  BP: (!) 111/55 (!) 107/46  Pulse: 100 98  Resp: (!) 30   Temp: 98.2 F (36.8 C) 98.1 F (36.7 C)  SpO2: 98% 98%    Exam:  Awake but non coherent eomi ncat  On oxygen abd soft nt nd no rebound no guard Neuro intact  I have personally reviewed the following:  DATA   Labs:  Bun /creat 19/1.1--->24/1.04  Potassium 3.2  Hemoglobin down from 14.7 admission to 11.3-->9,9  WBC still 12.3 form 9.8  Imaging studies: FINDINGS  Left Ventricle: The left ventricle has normal systolic function, with an ejection fraction of 60-65%. The cavity size was normal. There is no increase in left ventricular wall thickness. Left ventricular diastology could not be evaluated due to  nondiagnostic images. Right Ventricle: The right ventricle has normal systolic function. The cavity  was normal. There is no increase in right ventricular wall thickness. Left Atrium: left atrial size was normal in size Right Atrium: right atrial size was normal in size. Interatrial Septum: No atrial level shunt detected by color flow Doppler. Increased thickness of the atrial septum  sparing the fossa ovalis consistent with The interatrial septum appears to be lipomatous. Pericardium: There is no evidence of pericardial effusion. Mitral Valve: The mitral valve is normal in structure. Mitral valve regurgitation is not visualized by color flow Doppler. Tricuspid Valve: The tricuspid valve is normal in structure. Tricuspid valve regurgitation was not visualized by color flow Doppler. Aortic Valve: The aortic valve is tricuspid Moderate calcification of the aortic valve. Aortic valve regurgitation was not visualized by color flow Doppler. Pulmonic Valve: The pulmonic valve was normal in structure. Pulmonic valve regurgitation is not visualized by color flow Doppler. Venous: The inferior vena cava is normal in size with greater than 50% respiratory variability.  Medical tests:  n   Test discussed with performing physician:  n  Decision to obtain old records:  n  Review and summation of old records:  n  Scheduled Meds: . aspirin EC  325 mg Oral Q breakfast  . cephALEXin  500 mg Oral Q12H  . feeding supplement (ENSURE ENLIVE)  237 mL Oral BID BM  . folic acid  709 mcg Oral Daily  . furosemide  40 mg Oral Daily  . multivitamin with minerals  1 tablet Oral Daily  . pravastatin  10 mg Oral Daily  . pyridoxine  100 mg Oral Daily  . Rivastigmine 6 mg capsule  6 mg Oral BID   Continuous Infusions: . lactated ringers 35 mL/hr at 09/29/18 1354    Principal Problem:   Hip fracture (HCC) Active Problems:   Dementia (HCC)   CKD (chronic kidney disease), stage III (HCC)   PAD (peripheral artery disease) (HCC)   Acute pulmonary edema (HCC)   Acute on chronic respiratory failure with hypoxia (HCC)   LOS: 4 days

## 2018-10-03 ENCOUNTER — Inpatient Hospital Stay (HOSPITAL_COMMUNITY): Payer: PPO

## 2018-10-03 LAB — CBC WITH DIFFERENTIAL/PLATELET
Abs Immature Granulocytes: 0.07 10*3/uL (ref 0.00–0.07)
Basophils Absolute: 0 10*3/uL (ref 0.0–0.1)
Basophils Relative: 0 %
Eosinophils Absolute: 1.1 10*3/uL — ABNORMAL HIGH (ref 0.0–0.5)
Eosinophils Relative: 10 %
HCT: 29.4 % — ABNORMAL LOW (ref 36.0–46.0)
Hemoglobin: 9.6 g/dL — ABNORMAL LOW (ref 12.0–15.0)
Immature Granulocytes: 1 %
Lymphocytes Relative: 17 %
Lymphs Abs: 1.9 10*3/uL (ref 0.7–4.0)
MCH: 30.9 pg (ref 26.0–34.0)
MCHC: 32.7 g/dL (ref 30.0–36.0)
MCV: 94.5 fL (ref 80.0–100.0)
MONOS PCT: 8 %
Monocytes Absolute: 0.9 10*3/uL (ref 0.1–1.0)
Neutro Abs: 7.1 10*3/uL (ref 1.7–7.7)
Neutrophils Relative %: 64 %
Platelets: 232 10*3/uL (ref 150–400)
RBC: 3.11 MIL/uL — ABNORMAL LOW (ref 3.87–5.11)
RDW: 12.3 % (ref 11.5–15.5)
WBC: 11.1 10*3/uL — ABNORMAL HIGH (ref 4.0–10.5)
nRBC: 0 % (ref 0.0–0.2)

## 2018-10-03 LAB — RENAL FUNCTION PANEL
Albumin: 2.3 g/dL — ABNORMAL LOW (ref 3.5–5.0)
Anion gap: 8 (ref 5–15)
BUN: 24 mg/dL — ABNORMAL HIGH (ref 8–23)
CO2: 32 mmol/L (ref 22–32)
Calcium: 8.2 mg/dL — ABNORMAL LOW (ref 8.9–10.3)
Chloride: 96 mmol/L — ABNORMAL LOW (ref 98–111)
Creatinine, Ser: 1.06 mg/dL — ABNORMAL HIGH (ref 0.44–1.00)
GFR calc Af Amer: 57 mL/min — ABNORMAL LOW (ref >60)
GFR calc non Af Amer: 49 mL/min — ABNORMAL LOW (ref >60)
Glucose, Bld: 123 mg/dL — ABNORMAL HIGH (ref 70–99)
Phosphorus: 3.2 mg/dL (ref 2.5–4.6)
Potassium: 3.2 mmol/L — ABNORMAL LOW (ref 3.5–5.1)
Sodium: 136 mmol/L (ref 135–145)

## 2018-10-03 MED ORDER — LEVOFLOXACIN IN D5W 750 MG/150ML IV SOLN
750.0000 mg | INTRAVENOUS | Status: DC
  Administered 2018-10-03 – 2018-10-05 (×2): 750 mg via INTRAVENOUS
  Filled 2018-10-03 (×2): qty 150

## 2018-10-03 MED ORDER — GERHARDT'S BUTT CREAM
1.0000 "application " | TOPICAL_CREAM | Freq: Three times a day (TID) | CUTANEOUS | Status: DC
  Administered 2018-10-03 – 2018-10-06 (×7): 1 via TOPICAL
  Filled 2018-10-03: qty 1

## 2018-10-03 MED ORDER — LEVOFLOXACIN IN D5W 500 MG/100ML IV SOLN: 500.0000 mg | INTRAVENOUS | Status: DC

## 2018-10-03 NOTE — Progress Notes (Signed)
[facility] PROGRESS NOTE  [name] VPX:[id number] DOB: 09-19-1933 DOA: 09/28/2018 PCP: [name], [name], MD   Narrative: 85 year old Caucasian female Adult daycare participant 3 times per week at [facility] Severe dementia (MMSE 11/30 on last check 09/2017) with unstable gait and recurrent falls Bipolar Neuropathic pain on gabapentin Hyperlipidemia  Just seen 09/24/2018 and felt to probably have had a mild cystitis PCP noted a decline in her functional state she is however not doing as well as she had been and seems to be declining (now passing urine on herself whereas previously she would be understanding and coherent enough to pull down her underwear)  Patient was apparently standing and turned and fell onto her left side with loss of balance and unlikely syncope although this is not completely clear given patient's difficulty giving h/o She was found to be hypoxic in the 70s initially placed on oxygen-chest x-ray showed pulmonary edema  She was given a dose of Lasix with resolution orthopedics was consulted who felt she could go to surgery   A & Plan Post op fever, ? 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 per husband--would search for unlikely causes such as Rheumatological Acute urinary retention Needed in and out cath 2.28 Periodically occurs cont indwelling foley with cathter followed as OP with urology if persistent Left hip fracture status post left press-fit hemiarthroplasty 09/29/2018 per Ortho in terms of DVT proph [placed on 325 of aspirin needed until 10/27/2018], WB status, Has posterior hip precautions  Post op management etc Expected postoperative and dilutional anemia  Stabilized--heme 9 range Pulmonary edema likely secondary to undiagnosed acute diatolic heart failure Echocardiogram shows EF 60-65%n HFPEF Patient  received 1 dose of IV Lasix 2/25 Now on lasix 40 po od-changed from from IV History of dementia  Continue Exelon  Post op would be cautious with opiates, completely discontinue gabapentin in the outpatient setting Chronic kidney disease stage II  Trends are stable at this time Peripheral arterial disease  Cont ASa and plavix Moderate malnutrition BMI 22.7   Lovenox D/w husband at the bedside and asssesed concern now for infection--working up Will be d/c Monday pending SNF ability to take patient  Verlon Au, MD  [facility] Via [employer] app OR [email or web address] 7PM-7AM contact night coverage as above 10/03/2018, 5:43 PM  LOS: 5 days   Consultants:  Ortho  Procedures:  None yet  Antimicrobials:  none  Interval history/Subjective:  Confused-cannot obtain ROS Husband states has stopped coughing Note fever overnight  Objective:  Vitals:  Vitals:   10/03/18 0936 10/03/18 1449  BP: 117/77 103/71  Pulse: 91 98  Resp: 16   Temp:  99.1 F (37.3 C)  SpO2: 95% 98%    Exam:  Non coherent but pleasant --on nasal canula oxygen No rales no rhonchi AE bilat equal Tracks well with eyes Power 5/5 follow commands but not verbose   I have personally reviewed the following:  DATA   Labs:  Bun /creat 19/1.1--->24/1.04-->24/1.06  Potassium 3.2 still  Hemoglobin down from 14.7 admission to 11.3-->9,9  WBC still 12.3-->11.1  Imaging studies: FINDINGS  Left Ventricle: The left ventricle has normal systolic function, with an ejection fraction of 60-65%. The cavity size was normal. There is no increase in left ventricular wall thickness. Left ventricular diastology could not be evaluated due to  nondiagnostic images. Right Ventricle: The right ventricle  has normal systolic function. The cavity was normal. There is no increase in right ventricular wall thickness. Left Atrium: left atrial size was normal in size Right Atrium: right atrial size was normal in  size. Interatrial Septum: No atrial level shunt detected by color flow Doppler. Increased thickness of the atrial septum sparing the fossa ovalis consistent with The interatrial septum appears to be lipomatous. Pericardium: There is no evidence of pericardial effusion. Mitral Valve: The mitral valve is normal in structure. Mitral valve regurgitation is not visualized by color flow Doppler. Tricuspid Valve: The tricuspid valve is normal in structure. Tricuspid valve regurgitation was not visualized by color flow Doppler. Aortic Valve: The aortic valve is tricuspid Moderate calcification of the aortic valve. Aortic valve regurgitation was not visualized by color flow Doppler. Pulmonic Valve: The pulmonic valve was normal in structure. Pulmonic valve regurgitation is not visualized by color flow Doppler. Venous: The inferior vena cava is normal in size with greater than 50% respiratory variability.  CXR 3/1 IMPRESSION: Unchanged mild LEFT basilar opacity which may represent atelectasis or airspace disease.  Mild pulmonary vascular congestion and question trace bilateral pleural effusions.  Medical tests:  n   Test discussed with performing physician:  n  Decision to obtain old records:  n  Review and summation of old records:  n  Scheduled Meds: . aspirin EC  325 mg Oral Q breakfast  . feeding supplement (ENSURE ENLIVE)  237 mL Oral BID BM  . folic acid  007 mcg Oral Daily  . furosemide  40 mg Oral Daily  . Gerhardt's butt cream  1 application Topical TID  . multivitamin with minerals  1 tablet Oral Daily  . pravastatin  10 mg Oral Daily  . pyridoxine  100 mg Oral Daily  . Rivastigmine 6 mg capsule  6 mg Oral BID   Continuous Infusions: . lactated ringers 35 mL/hr at 09/29/18 1354  . levofloxacin (LEVAQUIN) IV 750 mg (10/03/18 1630)    Principal Problem:   Hip fracture (HCC) Active Problems:   Dementia (HCC)   CKD (chronic kidney disease), stage III (HCC)   PAD  (peripheral artery disease) (HCC)   Acute pulmonary edema (HCC)   Acute on chronic respiratory failure with hypoxia (HCC)   LOS: 5 days

## 2018-10-03 NOTE — Consult Note (Signed)
[place] Nurse wound consult note Reason for Consult:ecchymosis, Stage 2 pressure injury on right buttock Wound type:Pressure vs injury from fall Pressure Injury POA: Yes Measurement: 3.2cm x 2.5cm area of erythema vs resolving ecchymosis from fall with 1cm x 0.5cm x 0.2 partial thickness tissue loss. Wound MVE:HMCN, moist Drainage (amount, consistency, odor) scant serous Periwound:erythematous, warm, not firm (indurated) Dressing procedure/placement/frequency: Patient is s/p left hip fracture sustained during fall. I will provide a mattress replacement, Prevalon boots, a pressure redistribution chair pad for her use during times OOB and topical care guidance for the area in question above using TID application of Gerhart's Butt Cream, a compounded 1:1:1 product of zinc oxide, hydrocortisone and lotrimin. Nursing is to avoid the supine position in favor or side to side positioning.  [place] nursing team will not follow, but will remain available to this patient, the nursing and medical teams.  Please re-consult if needed. Thanks, Maudie Flakes, MSN, RN, [place], Arther Abbott  Pager# [phone]

## 2018-10-04 ENCOUNTER — Other Ambulatory Visit: Payer: Self-pay

## 2018-10-04 LAB — COMPREHENSIVE METABOLIC PANEL
ALT: 50 U/L — ABNORMAL HIGH (ref 0–44)
AST: 70 U/L — ABNORMAL HIGH (ref 15–41)
Albumin: 2.4 g/dL — ABNORMAL LOW (ref 3.5–5.0)
Alkaline Phosphatase: 67 U/L (ref 38–126)
Anion gap: 11 (ref 5–15)
BUN: 24 mg/dL — ABNORMAL HIGH (ref 8–23)
CHLORIDE: 96 mmol/L — AB (ref 98–111)
CO2: 31 mmol/L (ref 22–32)
Calcium: 8.6 mg/dL — ABNORMAL LOW (ref 8.9–10.3)
Creatinine, Ser: 1.1 mg/dL — ABNORMAL HIGH (ref 0.44–1.00)
GFR calc Af Amer: 54 mL/min — ABNORMAL LOW (ref >60)
GFR calc non Af Amer: 47 mL/min — ABNORMAL LOW (ref >60)
Glucose, Bld: 126 mg/dL — ABNORMAL HIGH (ref 70–99)
POTASSIUM: 3 mmol/L — AB (ref 3.5–5.1)
Sodium: 138 mmol/L (ref 135–145)
Total Bilirubin: 1.1 mg/dL (ref 0.3–1.2)
Total Protein: 5.6 g/dL — ABNORMAL LOW (ref 6.5–8.1)

## 2018-10-04 LAB — CBC WITH DIFFERENTIAL/PLATELET
Abs Immature Granulocytes: 0.12 10*3/uL — ABNORMAL HIGH (ref 0.00–0.07)
Basophils Absolute: 0.1 10*3/uL (ref 0.0–0.1)
Basophils Relative: 1 %
EOS PCT: 6 %
Eosinophils Absolute: 0.7 10*3/uL — ABNORMAL HIGH (ref 0.0–0.5)
HCT: 30.3 % — ABNORMAL LOW (ref 36.0–46.0)
Hemoglobin: 10 g/dL — ABNORMAL LOW (ref 12.0–15.0)
Immature Granulocytes: 1 %
Lymphocytes Relative: 13 %
Lymphs Abs: 1.8 10*3/uL (ref 0.7–4.0)
MCH: 31.2 pg (ref 26.0–34.0)
MCHC: 33 g/dL (ref 30.0–36.0)
MCV: 94.4 fL (ref 80.0–100.0)
Monocytes Absolute: 1.2 10*3/uL — ABNORMAL HIGH (ref 0.1–1.0)
Monocytes Relative: 9 %
Neutro Abs: 9.2 10*3/uL — ABNORMAL HIGH (ref 1.7–7.7)
Neutrophils Relative %: 70 %
PLATELETS: 309 10*3/uL (ref 150–400)
RBC: 3.21 MIL/uL — ABNORMAL LOW (ref 3.87–5.11)
RDW: 12.3 % (ref 11.5–15.5)
WBC: 13.1 10*3/uL — ABNORMAL HIGH (ref 4.0–10.5)
nRBC: 0 % (ref 0.0–0.2)

## 2018-10-04 MED ORDER — POTASSIUM CHLORIDE CRYS ER 20 MEQ PO TBCR
40.0000 meq | EXTENDED_RELEASE_TABLET | Freq: Two times a day (BID) | ORAL | Status: DC
  Administered 2018-10-04 – 2018-10-06 (×4): 40 meq via ORAL
  Filled 2018-10-04 (×4): qty 2

## 2018-10-04 NOTE — Social Work (Addendum)
5:14 pm [facility] returned call and started auth process. Awaiting determination.  4:39 pm Met with patient and spouse at bedside. Patient alert and engaged, but confused. Reviewed SNF bed offers with spouse. Spouse chose [facility]. Left message with [facility] admissions requesting to confirm bed offer, awaiting call back.  Noted updated PT notes. Left message with [facility] [facility] to request to start new SNF authorization, awaiting call back.  Will follow and support with discharge planning.  Estanislado Emms, LCSW [phone]

## 2018-10-04 NOTE — Patient Outreach (Signed)
[facility]) Care Management  10/04/2018  Kathryn [name] 1934/07/26 [id number]   Telephone Screen  Referral Date: 10/04/2018 Referral Source: Nurse Call Center Referral Reason: 3/01/20220-10:29pm-caller states he is [facility] POA over wife, she is in the hospital and he is very concerned because he feels they are trying to discharge her too soon, allowing her to stay until Monday" Nurse Line RN Advice: "advised caller to speak with charge nurse, admitting MD and case worker at hospital" Insurance: HTA   Referral received. Patient remains inpatient. Spouse was given advice from nurse at call center on how to appropriately follow up with this matter. No further interventions warranted at this time.       Plan: RN CM will close referral at this time.   Enzo Montgomery, RN,BSN,CCM [place] Management Telephonic Care Management Coordinator Direct Phone: [phone] Toll Free: [phone] Fax: [phone]

## 2018-10-04 NOTE — Progress Notes (Addendum)
Physical Therapy Treatment Patient Details Name: Kathryn [name] MRN: [id number] DOB: [birth year] Today's Date: 10/04/2018    History of Present Illness Patient is an 85 y/o female with a past medical history of dementia, recent UTI, depression and anxiety.  Presenting to the ED on 09/28/18 s/p fall at home with resultant L hip fracture. L hip hemiarthroplasty on 09/29/2018.    PT Comments    Pt admitted with above diagnosis. Pt currently with functional limitations due to balance and endurance deficits.Pt was able to ambulate a few steps today with RW with min assist and cues and chair follow.  Will continue to progress pt as able.   Needs SNF to progress to a level that husband can assist pt.  Pt will benefit from skilled PT to increase their independence and safety with mobility to allow discharge to the venue listed below.     Follow Up Recommendations  SNF;Supervision/Assistance - 24 hour     Equipment Recommendations  Other (comment)(defer)    Recommendations for Other Services       Precautions / Restrictions Precautions Precautions: Fall;Posterior Hip Precaution Comments: reviewed with patient and husband - wrote on room board Restrictions Weight Bearing Restrictions: No LLE Weight Bearing: Weight bearing as tolerated    Mobility  Bed Mobility Overal bed mobility: Needs Assistance Bed Mobility: Supine to Sit     Supine to sit: Min assist;+2 for safety/equipment;HOB elevated     General bed mobility comments: Pt initiated movement and follows commands if given time.  use of bed pad to move hips and pt used rail to pull up and elevate trunk  Transfers Overall transfer level: Needs assistance Equipment used: Rolling walker (2 wheeled) Transfers: Sit to/from Stand Sit to Stand: Mod assist;+2 safety/equipment;From elevated surface         General transfer comment: patient pushing up with R UE; able to bear weight through B LE; Needed help to put hands on appropriate  place on RW.  Pt stood well to RW so decided to ambulate a few steps.    Ambulation/Gait Ambulation/Gait assistance: Min assist;+2 safety/equipment Gait Distance (Feet): 7 Feet Assistive device: Rolling walker (2 wheeled) Gait Pattern/deviations: Step-to pattern;Decreased step length - right;Decreased stance time - left;Decreased weight shift to left;Antalgic   Gait velocity interpretation: <1.31 ft/sec, indicative of household ambulator General Gait Details: Pt was able to ambulate a few steps today with cues for sequencing steps and RW.  Pt followed with chair.  Sats Above 90% with 2LO2 in place.  Other VSS.     Stairs             Wheelchair Mobility    Modified Rankin (Stroke Patients Only)       Balance Overall balance assessment: Needs assistance Sitting-balance support: Bilateral upper extremity supported;Feet supported Sitting balance-Leahy Scale: Fair Sitting balance - Comments: Min guard asssit to sit EOB x 5 min   Standing balance support: Bilateral upper extremity supported;During functional activity Standing balance-Leahy Scale: Poor Standing balance comment: relies on UE support.                             Cognition Arousal/Alertness: Awake/alert Behavior During Therapy: WFL for tasks assessed/performed Overall Cognitive Status: History of cognitive impairments - at baseline                                 General Comments:  Husband quickly told this PT pt does not understand anything PT says.  Pt does not retain information but she does follow commands.       Exercises General Exercises - Lower Extremity Ankle Circles/Pumps: AROM;Both;5 reps;Supine Heel Slides: AAROM;Both;5 reps;Supine    General Comments General comments (skin integrity, edema, etc.): spouse present      Pertinent Vitals/Pain Pain Assessment: Faces Faces Pain Scale: Hurts a little bit Pain Location: L hip with mobility Pain Descriptors / Indicators:  Guarding;Grimacing Pain Intervention(s): Limited activity within patient's tolerance;Monitored during session;Repositioned    Home Living                      Prior Function            PT Goals (current goals can now be found in the care plan section) Acute Rehab PT Goals Patient Stated Goal: none stated Progress towards PT goals: Progressing toward goals    Frequency    Min 2X/week      PT Plan Current plan remains appropriate    Co-evaluation              AM-PAC PT "6 Clicks" Mobility   Outcome Measure  Help needed turning from your back to your side while in a flat bed without using bedrails?: A Lot Help needed moving from lying on your back to sitting on the side of a flat bed without using bedrails?: A Lot Help needed moving to and from a bed to a chair (including a wheelchair)?: A Little Help needed standing up from a chair using your arms (e.g., wheelchair or bedside chair)?: A Lot Help needed to walk in hospital room?: A Little Help needed climbing 3-5 steps with a railing? : Total 6 Click Score: 13    End of Session Equipment Utilized During Treatment: Gait belt;Oxygen Activity Tolerance: Patient limited by fatigue;Patient limited by pain Patient left: in chair;with call bell/phone within reach;with chair alarm set;with family/visitor present Nurse Communication: Mobility status PT Visit Diagnosis: Unsteadiness on feet (R26.81);Other abnormalities of gait and mobility (R26.89);Muscle weakness (generalized) (M62.81);History of falling (Z91.81)     Time: 1062-6948 PT Time Calculation (min) (ACUTE ONLY): 20 min  Charges:  $Gait Training: 8-22 mins                     Day Pager:  [phone]  Office:  [place] 10/04/2018, 4:14 PM

## 2018-10-04 NOTE — Telephone Encounter (Signed)
Tried to reach Mr. Dovel, no answer, left message. Betty [name], MD

## 2018-10-04 NOTE — Progress Notes (Signed)
Pt remained awake throughout the night. She was combative and attempted to pull her foley out. Mitt placed on bilateral hands. Unable to obtain VS this morning because pt was fighting, kicking and yelling.

## 2018-10-04 NOTE — Progress Notes (Signed)
[facility]ITALIST PROGRESS NOTE  TERI LEGACY IRC:[id number] DOB: 1933/09/22 DOA: 09/28/2018 PCP: [name], [name], MD   Narrative: 85 year old Caucasian female Adult daycare participant 3 times per week at [facility] Severe dementia (MMSE 11/30 on last check 09/2017) with unstable gait and recurrent falls Bipolar Neuropathic pain on gabapentin Hyperlipidemia  Just seen 09/24/2018 and felt to probably have had a mild cystitis PCP noted a decline in her functional state she is however not doing as well as she had been and seems to be declining (now passing urine on herself whereas previously she would be understanding and coherent enough to pull down her underwear)  Patient was apparently standing and turned and fell onto her left side with loss of balance and unlikely syncope although this is not completely clear given patient's difficulty giving h/o She was found to be hypoxic in the 70s initially placed on oxygen-chest x-ray showed pulmonary edema  She was given a dose of Lasix with resolution orthopedics was consulted who felt she could go to surgery   A & Plan Post op fever, ? 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 per husband--wouldn't search for unlikely causes such as Rheumatological  If no recurrent fevers would treat with a 5 day course of levaquin total ending 3/5 Acute urinary retention Needed in and out cath 2.28 Periodically occurs cont indwelling foley with catheter followed as OP with urology if persistent Left hip fracture status post left press-fit hemiarthroplasty 09/29/2018 per Ortho in terms of DVT proph [placed on 325 of aspirin needed until 10/27/2018], WB status Has posterior hip precautions  Post op management etc Expected postoperative and dilutional anemia  Stabilized--heme 9-10  range Pulmonary edema likely secondary to undiagnosed acute diatolic heart failure Echocardiogram shows EF 60-65%n HFPEF Patient received 1 dose of IV Lasix 2/25 Now on lasix 40 po od-changed from from IV History of dementia  Continue Exelon  Post op would be cautious with opiates, completely discontinue gabapentin in the outpatient setting Chronic kidney disease stage II  Trends are stable at this time Peripheral arterial disease  Cont ASa and plavix Moderate malnutrition BMI 22.7   Lovenox D/w husband at the bedside and asssesed concern now for infection--working up Will be d/c Monday pending SNF ability to take patient  Verlon Au, MD  [facility] Via [employer] app OR [email or web address] 7PM-7AM contact night coverage as above 10/04/2018, 12:32 PM  LOS: 6 days   Consultants:  Ortho  Procedures:  None yet  Antimicrobials:  none  Interval history/Subjective:  Awake alert  Eventful night-agitated Now is fine Husband voices concerns but appreciative  Objective:  Vitals:  Vitals:   10/03/18 2011 10/04/18 0745  BP: (!) 138/55 (!) 128/53  Pulse: 99   Resp: (!) 28 18  Temp: 99.1 F (37.3 C) 98.1 F (36.7 C)  SpO2: 96% 97%    Exam: No sig change form prior exams   Non-coherent but pleasant --on nasal canula oxygen Mouth and palate intact--Torus noted No rales no rhonchi AE bilat equal Tracks well with eyes Power 5/5 follow commands but not verbose   I have personally reviewed the following:  DATA   Labs:  Bun /creat 19/1.1--->24/1.04-->24/1.1  Potassium 3.0  Hemoglobin down from 14.7 admission to 11.3-->9,9  WBC still 12.3-->11.1-->13.1  Imaging studies: FINDINGS  Left Ventricle: The left ventricle has  normal systolic function, with an ejection fraction of 60-65%. The cavity size was normal. There is no increase in left ventricular wall thickness. Left ventricular diastology could not be evaluated due to  nondiagnostic images. Right  Ventricle: The right ventricle has normal systolic function. The cavity was normal. There is no increase in right ventricular wall thickness. Left Atrium: left atrial size was normal in size Right Atrium: right atrial size was normal in size. Interatrial Septum: No atrial level shunt detected by color flow Doppler. Increased thickness of the atrial septum sparing the fossa ovalis consistent with The interatrial septum appears to be lipomatous. Pericardium: There is no evidence of pericardial effusion. Mitral Valve: The mitral valve is normal in structure. Mitral valve regurgitation is not visualized by color flow Doppler. Tricuspid Valve: The tricuspid valve is normal in structure. Tricuspid valve regurgitation was not visualized by color flow Doppler. Aortic Valve: The aortic valve is tricuspid Moderate calcification of the aortic valve. Aortic valve regurgitation was not visualized by color flow Doppler. Pulmonic Valve: The pulmonic valve was normal in structure. Pulmonic valve regurgitation is not visualized by color flow Doppler. Venous: The inferior vena cava is normal in size with greater than 50% respiratory variability.  CXR 3/1 IMPRESSION: Unchanged mild LEFT basilar opacity which may represent atelectasis or airspace disease.  Mild pulmonary vascular congestion and question trace bilateral pleural effusions.  Medical tests:  n   Test discussed with performing physician:  n  Decision to obtain old records:  n  Review and summation of old records:  n  Scheduled Meds: . aspirin EC  325 mg Oral Q breakfast  . feeding supplement (ENSURE ENLIVE)  237 mL Oral BID BM  . folic acid  867 mcg Oral Daily  . furosemide  40 mg Oral Daily  . Gerhardt's butt cream  1 application Topical TID  . multivitamin with minerals  1 tablet Oral Daily  . pravastatin  10 mg Oral Daily  . pyridoxine  100 mg Oral Daily  . Rivastigmine 6 mg capsule  6 mg Oral BID   Continuous Infusions: .  lactated ringers 35 mL/hr at 09/29/18 1354  . levofloxacin (LEVAQUIN) IV 100 mL/hr at 10/03/18 1700    Principal Problem:   Hip fracture (HCC) Active Problems:   Dementia (HCC)   CKD (chronic kidney disease), stage III (HCC)   PAD (peripheral artery disease) (HCC)   Acute pulmonary edema (HCC)   Acute on chronic respiratory failure with hypoxia (HCC)   LOS: 6 days

## 2018-10-04 NOTE — Social Work (Signed)
Awaiting updated PT notes before starting new [facility] auth request for SNF. CSW to follow.  Estanislado Emms, LCSW [phone]

## 2018-10-04 NOTE — Progress Notes (Signed)
  Speech Language Pathology Treatment: Dysphagia  Patient Details Name: Kathryn [name] MRN: [id number] DOB: 1933-11-29 Today's Date: 10/04/2018 Time: 6945-0388 SLP Time Calculation (min) (ACUTE ONLY): 12 min  Assessment / Plan / Recommendation Clinical Impression  Skilled treatment session focused on dysphagia goals. SLP facilitated session by providing skilled observation of pt's husband feeding pt medicine whole in applesauce and thin liquids via cup. Pt free of overt s/s of dysphagia and aspiration. Additionally, pt's husband is very capable of feeding pt safely. ST to sign off at this time.    HPI HPI: Patient is an 85 y/o female with a past medical history of dementia, recent UTI, depression and anxiety.  Presenting to the ED on 09/28/18 s/p fall at home with resultant L hip fracture. L hip hemiarthroplasty on 09/29/2018. CXR Mild bilateral interstitial edema.          Recommendations  Diet recommendations: Regular;Thin liquid Liquids provided via: Cup Medication Administration: Whole meds with puree Supervision: Trained caregiver to feed patient Compensations: Slow rate;Small sips/bites;Minimize environmental distractions Postural Changes and/or Swallow Maneuvers: Seated upright 90 degrees                Oral Care Recommendations: Oral care BID Follow up Recommendations: None SLP Visit Diagnosis: Dysphagia, unspecified (R13.10)       GO                Kathryn [name] 10/04/2018, 2:56 PM

## 2018-10-05 ENCOUNTER — Telehealth: Payer: Self-pay

## 2018-10-05 DIAGNOSIS — L8990 Pressure ulcer of unspecified site, unspecified stage: Secondary | ICD-10-CM

## 2018-10-05 LAB — CBC WITH DIFFERENTIAL/PLATELET
Abs Immature Granulocytes: 0.12 10*3/uL — ABNORMAL HIGH (ref 0.00–0.07)
Basophils Absolute: 0.1 10*3/uL (ref 0.0–0.1)
Basophils Relative: 0 %
Eosinophils Absolute: 1.2 10*3/uL — ABNORMAL HIGH (ref 0.0–0.5)
Eosinophils Relative: 9 %
HCT: 31 % — ABNORMAL LOW (ref 36.0–46.0)
Hemoglobin: 10.5 g/dL — ABNORMAL LOW (ref 12.0–15.0)
Immature Granulocytes: 1 %
Lymphocytes Relative: 15 %
Lymphs Abs: 2 10*3/uL (ref 0.7–4.0)
MCH: 31.8 pg (ref 26.0–34.0)
MCHC: 33.9 g/dL (ref 30.0–36.0)
MCV: 93.9 fL (ref 80.0–100.0)
Monocytes Absolute: 1.2 10*3/uL — ABNORMAL HIGH (ref 0.1–1.0)
Monocytes Relative: 9 %
Neutro Abs: 9 10*3/uL — ABNORMAL HIGH (ref 1.7–7.7)
Neutrophils Relative %: 66 %
Platelets: 268 10*3/uL (ref 150–400)
RBC: 3.3 MIL/uL — ABNORMAL LOW (ref 3.87–5.11)
RDW: 12.6 % (ref 11.5–15.5)
WBC: 13.6 10*3/uL — ABNORMAL HIGH (ref 4.0–10.5)
nRBC: 0 % (ref 0.0–0.2)

## 2018-10-05 LAB — RENAL FUNCTION PANEL
Albumin: 2.4 g/dL — ABNORMAL LOW (ref 3.5–5.0)
Anion gap: 7 (ref 5–15)
BUN: 26 mg/dL — ABNORMAL HIGH (ref 8–23)
CO2: 30 mmol/L (ref 22–32)
Calcium: 8.4 mg/dL — ABNORMAL LOW (ref 8.9–10.3)
Chloride: 102 mmol/L (ref 98–111)
Creatinine, Ser: 1.11 mg/dL — ABNORMAL HIGH (ref 0.44–1.00)
GFR calc Af Amer: 54 mL/min — ABNORMAL LOW (ref >60)
GFR calc non Af Amer: 46 mL/min — ABNORMAL LOW (ref >60)
Glucose, Bld: 109 mg/dL — ABNORMAL HIGH (ref 70–99)
Phosphorus: 3.5 mg/dL (ref 2.5–4.6)
Potassium: 4.7 mmol/L (ref 3.5–5.1)
Sodium: 139 mmol/L (ref 135–145)

## 2018-10-05 LAB — URIC ACID: URIC ACID, SERUM: 5.6 mg/dL (ref 2.5–7.1)

## 2018-10-05 LAB — SEDIMENTATION RATE: Sed Rate: 64 mm/hr — ABNORMAL HIGH (ref 0–22)

## 2018-10-05 LAB — C-REACTIVE PROTEIN: CRP: 6.5 mg/dL — ABNORMAL HIGH (ref <1.0)

## 2018-10-05 MED ORDER — ESCITALOPRAM OXALATE 10 MG PO TABS
10.0000 mg | ORAL_TABLET | Freq: Every day | ORAL | Status: DC
  Administered 2018-10-05 – 2018-10-06 (×2): 10 mg via ORAL
  Filled 2018-10-05 (×2): qty 1

## 2018-10-05 NOTE — Progress Notes (Signed)
Physical Therapy Treatment Patient Details Name: Kathryn [name] MRN: [id number] DOB: 28-Mar-1934 Today's Date: 10/05/2018    History of Present Illness Patient is an 85 y/o female with a past medical history of dementia, recent UTI, depression and anxiety.  Presenting to the ED on 09/28/18 s/p fall at home with resultant L hip fracture. L hip hemiarthroplasty on 09/29/2018.    PT Comments    Pt admitted with above diagnosis. Pt currently with functional limitations due to balance and endurance deficits. Pt was able to ambulate with min assist and cues with +2 for safety with RW in hallway.  Needs constant cues due to confusion and cognition but is progressing.  Husband present and attentive.    Pt will benefit from skilled PT to increase their independence and safety with mobility to allow discharge to the venue listed below.     Follow Up Recommendations  SNF;Supervision/Assistance - 24 hour     Equipment Recommendations  Other (comment)(defer)    Recommendations for Other Services       Precautions / Restrictions Precautions Precautions: Fall;Posterior Hip Precaution Comments: reviewed with patient and husband - wrote on room board Restrictions Weight Bearing Restrictions: No LLE Weight Bearing: Weight bearing as tolerated    Mobility  Bed Mobility Overal bed mobility: Needs Assistance Bed Mobility: Supine to Sit     Supine to sit: Min assist;+2 for safety/equipment;HOB elevated     General bed mobility comments: Pt initiated movement and follows commands if given time.  use of bed pad to move hips and pt used rail to pull up and elevate trunk  Transfers Overall transfer level: Needs assistance Equipment used: Rolling walker (2 wheeled) Transfers: Sit to/from Stand Sit to Stand: Mod assist;+2 safety/equipment;From elevated surface         General transfer comment: Pt able to bear weight through B LE; Needed help to put hands on appropriate place on RW.  Pt stood well  to RW with cues. Cues for hip precautions.   Ambulation/Gait Ambulation/Gait assistance: Min assist;+2 safety/equipment Gait Distance (Feet): 110 Feet Assistive device: Rolling walker (2 wheeled) Gait Pattern/deviations: Step-to pattern;Decreased step length - right;Decreased stance time - left;Decreased weight shift to left;Antalgic   Gait velocity interpretation: <1.31 ft/sec, indicative of household ambulator General Gait Details: Pt was able to ambulate in hallway with cues for sequencing steps and RW.  Occasional assist steering RW.  Pt followed with chair.  Sats Above 90% without O2 therefore left O2 off.  Other VSS.     Stairs             Wheelchair Mobility    Modified Rankin (Stroke Patients Only)       Balance Overall balance assessment: Needs assistance Sitting-balance support: Bilateral upper extremity supported;Feet supported Sitting balance-Leahy Scale: Fair Sitting balance - Comments: Min guard asssit to sit EOB x 5 min   Standing balance support: Bilateral upper extremity supported;During functional activity Standing balance-Leahy Scale: Poor Standing balance comment: relies on UE support.                             Cognition Arousal/Alertness: Awake/alert Behavior During Therapy: WFL for tasks assessed/performed Overall Cognitive Status: History of cognitive impairments - at baseline                                 General Comments: Pt follows commands inconsistently and  with repetiion      Exercises General Exercises - Lower Extremity Ankle Circles/Pumps: AROM;Both;Supine;20 reps Quad Sets: AROM;Both;10 reps;Supine Long Arc Quad: AROM;Both;10 reps;Seated Heel Slides: AAROM;Both;Supine;10 reps Hip ABduction/ADduction: AROM;Both;10 reps;Supine    General Comments General comments (skin integrity, edema, etc.): spouse present       Pertinent Vitals/Pain Pain Assessment: Faces Faces Pain Scale: Hurts little  more Pain Location: L hip with mobility Pain Descriptors / Indicators: Guarding;Grimacing Pain Intervention(s): Limited activity within patient's tolerance;Monitored during session;Repositioned    Home Living                      Prior Function            PT Goals (current goals can now be found in the care plan section) Acute Rehab PT Goals Patient Stated Goal: none stated Progress towards PT goals: Progressing toward goals    Frequency    Min 2X/week      PT Plan Current plan remains appropriate    Co-evaluation              AM-PAC PT "6 Clicks" Mobility   Outcome Measure  Help needed turning from your back to your side while in a flat bed without using bedrails?: A Lot Help needed moving from lying on your back to sitting on the side of a flat bed without using bedrails?: A Lot Help needed moving to and from a bed to a chair (including a wheelchair)?: A Little Help needed standing up from a chair using your arms (e.g., wheelchair or bedside chair)?: A Lot Help needed to walk in hospital room?: A Little Help needed climbing 3-5 steps with a railing? : Total 6 Click Score: 13    End of Session Equipment Utilized During Treatment: Gait belt Activity Tolerance: Patient limited by fatigue;Patient limited by pain Patient left: in chair;with call bell/phone within reach;with chair alarm set;with family/visitor present Nurse Communication: Mobility status PT Visit Diagnosis: Unsteadiness on feet (R26.81);Other abnormalities of gait and mobility (R26.89);Muscle weakness (generalized) (M62.81);History of falling (Z91.81)     Time: 2836-6294 PT Time Calculation (min) (ACUTE ONLY): 36 min  Charges:  $Gait Training: 8-22 mins $Therapeutic Exercise: 8-22 mins                     [place] Pager:  [phone]  Office:  Page 10/05/2018, 12:02 PM

## 2018-10-05 NOTE — Care Management Important Message (Signed)
Important Message  Patient Details  Name: Kathryn [name] MRN: [id number] Date of Birth: 10/07/1933   Medicare Important Message Given:  Yes    Yuvan Medinger P Skiler Tye 10/05/2018, 12:55 PM

## 2018-10-05 NOTE — Consult Note (Signed)
   [facility] Inpatient Consult   10/05/2018  AIRLIE BLUMENBERG [birth year] [id number]  Patient screened hospitalizations to check if potential [facility] services are needed. MD notes reveals as follows.  Patient was hospitalized for this 85 y/o female with a past medical history of dementia, recent UTI, depression and anxiety.  Presenting to the ED on 09/28/18 s/p fall at home with resultant L hip fracture. L hip hemiarthroplasty on 09/29/2018. Patient/family is seeking rehab. Chart reviewed and patient has been [place] for [facility] for rehab needs.  No current [facility] follow up needs noted at this time.  For questions contact:   Natividad Brood, RN BSN [facility] Liaison  [phone] business mobile phone Toll free office [phone]

## 2018-10-05 NOTE — Social Work (Signed)
Patient has approval from [facility] for SNF. Auth #[id number]. Updated husband. Husband will go to [facility] tomorrow to complete admissions paperwork.   CSW will follow for medical readiness and support with discharge planning.  Estanislado Emms, LCSW [phone]

## 2018-10-05 NOTE — Telephone Encounter (Signed)
Patient's husband called the after hours nurse and wanted PCP to know that patient is still in the hospital.

## 2018-10-05 NOTE — Progress Notes (Signed)
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 [employer] Severe dementia (MMSE 11/30 on last check 09/2017) with unstable gait and recurrent falls Bipolar Neuropathic pain on gabapentin Hyperlipidemia  Just seen 09/24/2018 and felt to probably have had a mild cystitis PCP noted a decline in her functional state she is however not doing as well as she had been and seems to be declining (now passing urine on herself whereas previously she would be understanding and coherent enough to pull down her underwear)  Patient was apparently standing and turned and fell onto her left side with loss of balance and unlikely syncope although this is not completely clear given patient's difficulty giving h/o She was found to be hypoxic in the 70s initially placed on oxygen-chest x-ray showed pulmonary edema  She was given a dose of Lasix with resolution orthopedics was consulted who felt she could go to surgery   A & Plan Post op fever, ? Infection vs other possibly from aspiration  Tmax 101 again overnight 3/1, prior fever  2/27--still low grade fever depsit antibiotics  CXR and SLP might have concern for  aspiration As WBC still up and low grade temp, will search for AI and get PLT smear-follow ds dna, rf, smear, uric acid [r/o gout] Would consider prednisone in next 1-2 days if still persisting wbc and low grade Acute urinary retention Needed in and out cath 2.28 Periodically occurs  cont indwelling foley with catheter followed as OP with urology if persistent Left hip fracture status post left press-fit hemiarthroplasty 09/29/2018 per Ortho in terms of DVT proph [placed on 325 of aspirin needed until 10/27/2018], WB status Has posterior hip precautions  Post op management etc Expected postoperative and dilutional anemia  Stabilized--heme  9-10 range Pulmonary edema likely secondary to undiagnosed acute diatolic heart failure Echocardiogram shows EF 60-65%n HFPEF Patient received 1 dose of IV Lasix 2/25 Now on lasix 40 po od-changed from from IV History of dementia  Continue Exelon  Post op would be cautious with opiates, completely discontinue gabapentin in the outpatient setting Chronic kidney disease stage II  Trends are stable at this time Peripheral arterial disease  Cont ASa and plavix Moderate malnutrition BMI 22.7   Lovenox D/w husband at the bedside and asssesed concern now for infection--working up Not ready for d/c at this time  Verlon Au, MD  [facility] Via [employer] app OR [email or web address] 7PM-7AM contact night coverage as above 10/05/2018, 1:29 PM  LOS: 7 days   Consultants:  Ortho  Procedures:  None yet  Antimicrobials:  none  Interval history/Subjective:  Some agitation at nights No other issue  Objective:  Vitals:  Vitals:   10/04/18 2134 10/05/18 0852  BP: (!) 122/97 (!) 125/53  Pulse: (!) 101 86  Resp:  (!) 23  Temp: 99.2 F (37.3 C) 99 F (37.2 C)  SpO2: 95% 98%    Exam:  Coherent to minimal amount cta b no adde dsound abd sfot nt nd no rebound No le edema Neuro intact but confused  I have personally reviewed the following:  DATA   Labs:  Bun /creat 19/1.1--->24/1.04-->24/1.1-->26/1.1  Potassium 3.0-->4.7  Uric acid 5.6  Hemoglobin down from 14.7 admission to 11.3-->10.5  WBC still 12.3-->11.1-->13.1-->13.6  Imaging studies: FINDINGS  Left Ventricle: The left ventricle has normal systolic function, with an ejection fraction of 60-65%. The cavity size was  normal. There is no increase in left ventricular wall thickness. Left ventricular diastology could not be evaluated due to  nondiagnostic images. Right Ventricle: The right ventricle has normal systolic function. The cavity was normal. There is no increase in right ventricular wall thickness. Left  Atrium: left atrial size was normal in size Right Atrium: right atrial size was normal in size. Interatrial Septum: No atrial level shunt detected by color flow Doppler. Increased thickness of the atrial septum sparing the fossa ovalis consistent with The interatrial septum appears to be lipomatous. Pericardium: There is no evidence of pericardial effusion. Mitral Valve: The mitral valve is normal in structure. Mitral valve regurgitation is not visualized by color flow Doppler. Tricuspid Valve: The tricuspid valve is normal in structure. Tricuspid valve regurgitation was not visualized by color flow Doppler. Aortic Valve: The aortic valve is tricuspid Moderate calcification of the aortic valve. Aortic valve regurgitation was not visualized by color flow Doppler. Pulmonic Valve: The pulmonic valve was normal in structure. Pulmonic valve regurgitation is not visualized by color flow Doppler. Venous: The inferior vena cava is normal in size with greater than 50% respiratory variability.  CXR 3/1 IMPRESSION: Unchanged mild LEFT basilar opacity which may represent atelectasis or airspace disease.  Mild pulmonary vascular congestion and question trace bilateral pleural effusions.  Medical tests:  n   Test discussed with performing physician:  n  Decision to obtain old records:  n  Review and summation of old records:  n  Scheduled Meds: . aspirin EC  325 mg Oral Q breakfast  . feeding supplement (ENSURE ENLIVE)  237 mL Oral BID BM  . folic acid  916 mcg Oral Daily  . furosemide  40 mg Oral Daily  . Gerhardt's butt cream  1 application Topical TID  . multivitamin with minerals  1 tablet Oral Daily  . potassium chloride  40 mEq Oral BID  . pravastatin  10 mg Oral Daily  . pyridoxine  100 mg Oral Daily  . Rivastigmine 6 mg capsule  6 mg Oral BID   Continuous Infusions: . lactated ringers 35 mL/hr at 09/29/18 1354  . levofloxacin (LEVAQUIN) IV 100 mL/hr at 10/03/18 1700     Principal Problem:   Hip fracture (HCC) Active Problems:   Dementia (HCC)   CKD (chronic kidney disease), stage III (HCC)   PAD (peripheral artery disease) (HCC)   Acute pulmonary edema (HCC)   Acute on chronic respiratory failure with hypoxia (HCC)   Pressure injury of skin   LOS: 7 days

## 2018-10-05 NOTE — Progress Notes (Signed)
Nutrition Follow-up  DOCUMENTATION CODES:   Not applicable  INTERVENTION:    Continue Ensure Enlive po BID, each supplement provides 350 kcal and 20 grams of protein  Continue MVI daily  NUTRITION DIAGNOSIS:   Increased nutrient needs related to hip fracture as evidenced by estimated needs.  Ongoing  GOAL:   Patient will meet greater than or equal to 90% of their needs  Met  MONITOR:   PO intake, Supplement acceptance, Labs, Skin  ASSESSMENT:   85 yo female with PMH of anxiety, Alzheimer's dementia, HTN who was admitted with L hip fracture s/p fall.  Low grade fever is ongoing. Temp (24hrs), Avg:98.7 F (37.1 C), Min:97.9 F (36.6 C), Max:99.2 F (37.3 C) WBC remain elevated: 13.6 today.  S/P dysphagia treatment with SLP 3/2. Patient continues on a regular diet with thin liquids. Patient has been consuming 50-100% of meals. She is also receiving Ensure Enlive BID.    Medications reviewed.   Diet Order:   Diet Order            Diet - low sodium heart healthy        Diet regular Room service appropriate? Yes; Fluid consistency: Thin  Diet effective now              EDUCATION NEEDS:   No education needs have been identified at this time  Skin:  Skin Assessment: Skin Integrity Issues: Skin Integrity Issues:: Stage II Stage II: buttocks  Last BM:  3/2  Height:   Ht Readings from Last 1 Encounters:  09/28/18 _0  (1.575 m)    Weight:   Wt Readings from Last 1 Encounters:  10/03/18 62.8 kg    Ideal Body Weight:  50 kg  BMI:  Body mass index is 25.32 kg/m.  Estimated Nutritional Needs:   Kcal:  1350-1550  Protein:  65-80 gm  Fluid:  >/= 1.5 L    Molli Barrows, RD, LDN, [place] Pager [phone] After Hours Pager [phone]

## 2018-10-06 ENCOUNTER — Telehealth: Payer: Self-pay | Admitting: *Deleted

## 2018-10-06 DIAGNOSIS — N183 Chronic kidney disease, stage 3 (moderate): Secondary | ICD-10-CM

## 2018-10-06 DIAGNOSIS — L8990 Pressure ulcer of unspecified site, unspecified stage: Secondary | ICD-10-CM | POA: Diagnosis not present

## 2018-10-06 DIAGNOSIS — G3109 Other frontotemporal dementia: Secondary | ICD-10-CM | POA: Diagnosis not present

## 2018-10-06 DIAGNOSIS — F0390 Unspecified dementia without behavioral disturbance: Secondary | ICD-10-CM | POA: Diagnosis not present

## 2018-10-06 DIAGNOSIS — M2550 Pain in unspecified joint: Secondary | ICD-10-CM | POA: Diagnosis not present

## 2018-10-06 DIAGNOSIS — F0280 Dementia in other diseases classified elsewhere without behavioral disturbance: Secondary | ICD-10-CM | POA: Diagnosis not present

## 2018-10-06 DIAGNOSIS — J9621 Acute and chronic respiratory failure with hypoxia: Secondary | ICD-10-CM | POA: Diagnosis not present

## 2018-10-06 DIAGNOSIS — I739 Peripheral vascular disease, unspecified: Secondary | ICD-10-CM | POA: Diagnosis not present

## 2018-10-06 DIAGNOSIS — R338 Other retention of urine: Secondary | ICD-10-CM | POA: Diagnosis not present

## 2018-10-06 DIAGNOSIS — R262 Difficulty in walking, not elsewhere classified: Secondary | ICD-10-CM | POA: Diagnosis not present

## 2018-10-06 DIAGNOSIS — R2689 Other abnormalities of gait and mobility: Secondary | ICD-10-CM | POA: Diagnosis not present

## 2018-10-06 DIAGNOSIS — R1311 Dysphagia, oral phase: Secondary | ICD-10-CM | POA: Diagnosis not present

## 2018-10-06 DIAGNOSIS — R278 Other lack of coordination: Secondary | ICD-10-CM | POA: Diagnosis not present

## 2018-10-06 DIAGNOSIS — R339 Retention of urine, unspecified: Secondary | ICD-10-CM | POA: Diagnosis not present

## 2018-10-06 DIAGNOSIS — S7292XD Unspecified fracture of left femur, subsequent encounter for closed fracture with routine healing: Secondary | ICD-10-CM | POA: Diagnosis not present

## 2018-10-06 DIAGNOSIS — L89152 Pressure ulcer of sacral region, stage 2: Secondary | ICD-10-CM | POA: Diagnosis not present

## 2018-10-06 DIAGNOSIS — D649 Anemia, unspecified: Secondary | ICD-10-CM | POA: Diagnosis not present

## 2018-10-06 DIAGNOSIS — J810 Acute pulmonary edema: Secondary | ICD-10-CM | POA: Diagnosis not present

## 2018-10-06 DIAGNOSIS — S72009A Fracture of unspecified part of neck of unspecified femur, initial encounter for closed fracture: Secondary | ICD-10-CM | POA: Diagnosis not present

## 2018-10-06 DIAGNOSIS — E7849 Other hyperlipidemia: Secondary | ICD-10-CM | POA: Diagnosis not present

## 2018-10-06 DIAGNOSIS — S72002A Fracture of unspecified part of neck of left femur, initial encounter for closed fracture: Secondary | ICD-10-CM | POA: Diagnosis not present

## 2018-10-06 DIAGNOSIS — S72002D Fracture of unspecified part of neck of left femur, subsequent encounter for closed fracture with routine healing: Secondary | ICD-10-CM | POA: Diagnosis not present

## 2018-10-06 DIAGNOSIS — Z7401 Bed confinement status: Secondary | ICD-10-CM | POA: Diagnosis not present

## 2018-10-06 DIAGNOSIS — J189 Pneumonia, unspecified organism: Secondary | ICD-10-CM | POA: Diagnosis not present

## 2018-10-06 LAB — CBC
HCT: 34.2 % — ABNORMAL LOW (ref 36.0–46.0)
Hemoglobin: 10.9 g/dL — ABNORMAL LOW (ref 12.0–15.0)
MCH: 31.3 pg (ref 26.0–34.0)
MCHC: 31.9 g/dL (ref 30.0–36.0)
MCV: 98.3 fL (ref 80.0–100.0)
Platelets: 527 10*3/uL — ABNORMAL HIGH (ref 150–400)
RBC: 3.48 MIL/uL — ABNORMAL LOW (ref 3.87–5.11)
RDW: 13 % (ref 11.5–15.5)
WBC: 13.1 10*3/uL — ABNORMAL HIGH (ref 4.0–10.5)
nRBC: 0 % (ref 0.0–0.2)

## 2018-10-06 LAB — COMPREHENSIVE METABOLIC PANEL
ALT: 41 U/L (ref 0–44)
AST: 42 U/L — ABNORMAL HIGH (ref 15–41)
Albumin: 2.4 g/dL — ABNORMAL LOW (ref 3.5–5.0)
Alkaline Phosphatase: 78 U/L (ref 38–126)
Anion gap: 8 (ref 5–15)
BUN: 29 mg/dL — AB (ref 8–23)
CO2: 26 mmol/L (ref 22–32)
Calcium: 8.1 mg/dL — ABNORMAL LOW (ref 8.9–10.3)
Chloride: 102 mmol/L (ref 98–111)
Creatinine, Ser: 1.09 mg/dL — ABNORMAL HIGH (ref 0.44–1.00)
GFR calc Af Amer: 55 mL/min — ABNORMAL LOW (ref >60)
GFR calc non Af Amer: 47 mL/min — ABNORMAL LOW (ref >60)
GLUCOSE: 144 mg/dL — AB (ref 70–99)
Potassium: 4.4 mmol/L (ref 3.5–5.1)
Sodium: 136 mmol/L (ref 135–145)
Total Bilirubin: 0.8 mg/dL (ref 0.3–1.2)
Total Protein: 5.6 g/dL — ABNORMAL LOW (ref 6.5–8.1)

## 2018-10-06 LAB — RHEUMATOID FACTOR: Rheumatoid fact SerPl-aCnc: 11.5 IU/mL (ref 0.0–13.9)

## 2018-10-06 LAB — ANTI-DNA ANTIBODY, DOUBLE-STRANDED: ds DNA Ab: 2 IU/mL (ref 0–9)

## 2018-10-06 MED ORDER — LEVOFLOXACIN 750 MG PO TABS: 750.0000 mg | ORAL_TABLET | ORAL | 0 refills | Status: DC

## 2018-10-06 NOTE — Progress Notes (Signed)
CSW J. Riffey advised unable to d/c patient to [facility] tonight.   Husband and patient notified.  Dr Ree Kida notified.  Patient placed back on monitor.

## 2018-10-06 NOTE — Progress Notes (Signed)
   Subjective: 7 Days Post-Op Procedure(s) (LRB): left hip (HEMIARTHROPLASTY) (Left) Patient reports pain as mild and moderate.    Objective: Vital signs in last 24 hours: Temp:  [98.3 F (36.8 C)-98.8 F (37.1 C)] 98.3 F (36.8 C) (03/04 0651) Pulse Rate:  [82-90] 85 (03/04 0804) Resp:  [17-21] 17 (03/04 0804) BP: (110-132)/(56-64) 132/64 (03/04 0803) SpO2:  [94 %-98 %] 96 % (03/04 0804) Weight:  [91.6 kg] 91.6 kg (03/04 0651)  Intake/Output from previous day: 03/03 0701 - 03/04 0700 In: 456 [P.O.:456] Out: 1675 [Urine:1675] Intake/Output this shift: Total I/O In: 120 [P.O.:120] Out: -   Recent Labs    10/04/18 0400 10/05/18 0327 10/06/18 1400  HGB 10.0* 10.5* 10.9*   Recent Labs    10/05/18 0327 10/06/18 1400  WBC 13.6* 13.1*  RBC 3.30* 3.48*  HCT 31.0* 34.2*  PLT 268 527*   Recent Labs    10/04/18 0400 10/05/18 0327  NA 138 139  K 3.0* 4.7  CL 96* 102  CO2 31 30  BUN 24* 26*  CREATININE 1.10* 1.11*  GLUCOSE 126* 109*  CALCIUM 8.6* 8.4*   No results for input(s): LABPT, INR in the last 72 hours.  Neurologically intact No results found.  Assessment/Plan: 7 Days Post-Op Procedure(s) (LRB): left hip (HEMIARTHROPLASTY) (Left) Up with therapy, walked 300 ft.  , shortly to SNF  Office with me 1 to 2 wks.  WBAT.   New dressing applied incision looks good.   Kathryn [name] 10/06/2018, 3:50 PM

## 2018-10-06 NOTE — Progress Notes (Addendum)
CSW received a call from [place] at [facility] stating pt can be D/C'd to [facility] tonight although earlier CSW was informed it was too late in the evening.  CSW sent D/C summary via the hub, RN updated, D/C packet provided to RN.  RN updated pt's husband who is returning to [facility] now to assist with transport.  Pt going by [facility].  7:08 PM [facility] called.  Please reconsult if future social work needs arise.  CSW signing off, as social work intervention is no longer needed.  Alphonse Guild. Akiyah Eppolito, LCSW, LCAS, CSI Clinical Social Worker Ph: [phone]

## 2018-10-06 NOTE — Discharge Summary (Signed)
Physician Discharge Summary  Kathryn [name]:[id number] DOB: 1934/04/10 DOA: 09/28/2018  PCP: [name], [name], MD  Admit date: 09/28/2018 Discharge date: 10/06/2018  Time spent: 45 minutes  Recommendations for Outpatient Follow-up:  Patient will be discharged to skilled nursing facility, continue physical and occupational therapy.  Patient will need to follow up with primary care provider within one week of discharge, repeat CBC and CMP.  Follow-up with Dr. Lorin Mercy, orthopedic surgery, in 1 to 2 weeks.  Patient should continue medications as prescribed.  Patient should follow a regular diet.   Discharge Diagnoses:  Acute pulmonary edema/acute respiratory failure with hypoxia Left hip fracture Leukocytosis with low-grade fever possible pneumonia Normocytic anemia Dementia Chronic kidney disease, stage III Urinary retention Peripheral arterial disease Mildly elevated LFTs  Discharge Condition: Stable  Diet recommendation:   Filed Weights   10/01/18 0411 10/03/18 0500 10/06/18 0651  Weight: 57.3 kg 62.8 kg 91.6 kg    History of present illness: On 09/28/2018 by Dr. Bonnielee Haff Kathryn [name] is a 85 y.o. female with a past medical history of dementia, recent UTI, depression and anxiety who lives with her husband.  Patient unable to provide any history due to her dementia.  According to the husband patient was standing and then she turned and then fell on her left side.  She apparently lost her balance.  No syncopal episode.  She was able to get up with assistance and go to the bed.  At that time deformity was noted on the left side.  Patient was brought into the hospital.  Patient currently denies any pain in the left hip area.  She denies any shortness of breath.  No chest pain currently.  No fever chills.  No nausea or vomiting recently.  Not very communicative at this time.  Possibly due to dementia.  No history of head injuries.  Hospital Course:  Acute pulmonary edema/acute  respiratory failure with hypoxia -Patient was noted to have oxygen saturations in the 70s when she presented to the emergency department.  Chest x-ray showed acute pulmonary edema.  Patient did receive a 500 mL bolus of IV fluids in the emergency department. -Patient was given Lasix -Echocardiogram EF 60 to 65%.  Cavity size was normal.  LV diastolic G cannot be evaluated due to nondiagnostic images.  RV has normal systolic function. -Pulmonary edema secondary to unknown etiology at this time. -Patient currently on room air and maintaining oxygen saturations in the mid 90s  Left hip fracture -Status post mechanical fall -Orthopedic surgery consulted and appreciated, status post left press-fit team arthroplasty for femoral neck fracture -PT, OT recommending SNF  Leukocytosis with low-grade fever possible pneumonia -Patient presented to the emergency department with elevated leukocytosis of 25.6 -She was recently found to have an E. coli UTI prior to admission and completed 2 courses of antibiotics -UA on 10/01/2018 unremarkable for infection -Chest x-ray on 10/03/2018 showed unchanged mild left basilar opacity which may represent atelectasis or airspace disease.  Mild pulmonary vascular congestion question trace bilateral pleural effusions -Patient was placed on Levaquin for possible pneumonia.  There was concern for aspiration, speech therapy was consulted, amended regular diet with thin liquids -She continues to have leukocytosis, currently now 13.1 -Recommend repeat CBC in 1 week -Patient also had negative uric acid, anti-DNA antibody, rheumatoid factor  Normocytic anemia -Likely secondary to dilutional component as well as recent surgery -Hemoglobin remained stable, currently 10.9  Dementia -Continue Exelon  Chronic kidney disease, stage III -Creatinine appears to  be stable  Urinary retention -Foley catheter was removed, patient able to urinate on her own -May want to follow-up with  urology if this persists in the future  Peripheral arterial disease -Continue aspirin, statin  Mildly elevated LFTs -Proved, unclear etiology.  Repeat CMP in 1 week  Procedures: left press-fit team arthroplasty for femoral neck fracture Echocardiogram  Consultations: Orthopedic surgery  Discharge Exam: Vitals:   10/06/18 0803 10/06/18 0804  BP: 132/64   Pulse: 82 85  Resp: (!) 21 17  Temp:    SpO2: 96% 96%     General: Well developed, elderly, NAD  HEENT: NCAT, mucous membranes moist.  Neck: Supple  Cardiovascular: S1 S2 auscultated, RRR  Respiratory: Clear to auscultation bilaterally  Abdomen: Soft, nontender, nondistended, + bowel sounds  Extremities: warm dry without cyanosis clubbing or edema  Neuro: AAOx1 (self), nonfocal  Psych: Appropriate mood and affect  Discharge Instructions Discharge Instructions    Diet - low sodium heart healthy   Complete by:  As directed    Discharge instructions   Complete by:  As directed    Patient will be discharged to skilled nursing facility, continue physical and occupational therapy.  Patient will need to follow up with primary care provider within one week of discharge, repeat CBC and CMP.  Follow-up with Dr. Lorin Mercy, orthopedic surgery, in 1 to 2 weeks.  Patient should continue medications as prescribed.  Patient should follow a regular diet.   Increase activity slowly   Complete by:  As directed      Allergies as of 10/06/2018      Reactions   Aricept [donepezil Hcl] Other (See Comments)   "giving stomach and digestive problems"   Sulfonamide Derivatives    REACTION: Swollen joints      Medication List    STOP taking these medications   ciprofloxacin 250 MG tablet Commonly known as:  CIPRO   gabapentin 100 MG capsule Commonly known as:  NEURONTIN     TAKE these medications   aspirin 325 MG EC tablet Take 1 tablet (325 mg total) by mouth daily with breakfast. What changed:    medication  strength  how much to take  when to take this   COQ10 PO Take by mouth.   escitalopram 10 MG tablet Commonly known as:  LEXAPRO Take 10 mg by mouth daily.   folic acid 681 MCG tablet Commonly known as:  FOLVITE Take 400 mcg by mouth daily.   HYDROcodone-acetaminophen 5-325 MG tablet Commonly known as:  NORCO/VICODIN Take 1-2 tablets by mouth every 4 (four) hours as needed for moderate pain (pain score 4-6).   levofloxacin 750 MG tablet Commonly known as:  LEVAQUIN Take 1 tablet (750 mg total) by mouth every other day.   NON FORMULARY MK-7 90 meq   polyethylene glycol packet Commonly known as:  MIRALAX / GLYCOLAX Take 17 g by mouth daily as needed for mild constipation.   pravastatin 10 MG tablet Commonly known as:  PRAVACHOL Take 1 tablet (10 mg total) by mouth daily.   pyridoxine 100 MG tablet Commonly known as:  B-6 Take 100 mg by mouth daily.   rivastigmine 6 MG capsule Commonly known as:  EXELON TAKE 1 CAPSULE (6 MG TOTAL) 2 (TWO) TIMES DAILY BY MOUTH   senna-docusate 8.6-50 MG tablet Commonly known as:  Senokot-S Take 1 tablet by mouth at bedtime as needed for mild constipation.   Vitamin D3 50 MCG (2000 UT) Tabs Take 3,000 Units by mouth daily.  zinc gluconate 50 MG tablet Take 50 mg by mouth daily.      Allergies  Allergen Reactions  . Aricept [Donepezil Hcl] Other (See Comments)    "giving stomach and digestive problems"  . Sulfonamide Derivatives     REACTION: Swollen joints    Contact information for follow-up providers    Marybelle Killings, MD. Schedule an appointment as soon as possible for a visit today.   Specialty:  Orthopedic Surgery Why:  need return office visit 2 weeks postop Contact information: [place] [address] [phone]        [name], [name], MD. Schedule an appointment as soon as possible for a visit in 1 week(s).   Specialty:  Family Medicine Why:  Hospital follow up Contact  information: [place] [address] [phone]            Contact information for after-discharge care    Destination    [facility]erred SNF .   Service:  Skilled Nursing Contact information: [place] [phone]                   The results of significant diagnostics from this hospitalization (including imaging, microbiology, ancillary and laboratory) are listed below for reference.    Significant Diagnostic Studies: Dg Chest 1 View  Result Date: 09/28/2018 CLINICAL DATA:  85 year old who fell this morning and injured the LEFT hip. Generalized weakness. Current history of Alzheimer's dementia. EXAM: CHEST  1 VIEW COMPARISON:  09/11/2016, 02/04/2007. FINDINGS: AP SEMI-ERECT view demonstrates borderline to mild cardiac enlargement, unchanged. Thoracic aorta tortuous and atherosclerotic. Hilar and mediastinal contours otherwise unremarkable. Stable linear scarring at the lung bases. Diffuse interstitial pulmonary opacities which are new since the most recent prior examination. No confluent airspace consolidation. No visible pleural effusions. IMPRESSION: Acute mild CHF and/or fluid overload with interstitial pulmonary edema (favored over interstitial pneumonitis). Electronically Signed   By: Evangeline Dakin M.D.   On: 09/28/2018 13:24   Dg Chest 2 View  Result Date: 10/03/2018 CLINICAL DATA:  Pneumonia EXAM: CHEST - 2 VIEW COMPARISON:  09/30/2018 FINDINGS: UPPER limits of normal heart size and mild pulmonary vascular congestion again noted. Mild LEFT basilar opacity is unchanged. There may be trace bilateral pleural effusions. No pneumothorax or acute bony abnormality. IMPRESSION: Unchanged mild LEFT basilar opacity which may represent atelectasis or airspace disease. Mild pulmonary vascular congestion and question trace bilateral pleural effusions. Electronically Signed   By: Margarette Canada  M.D.   On: 10/03/2018 10:08   Dg Chest 2 View  Result Date: 09/30/2018 CLINICAL DATA:  Heart failure EXAM: CHEST - 2 VIEW COMPARISON:  September 28, 2018 FINDINGS: The heart size and mediastinal contours are stable. Mild bilateral interstitial edema is identified. There is no focal pneumonia. The visualized skeletal structures are unremarkable. IMPRESSION: Mild bilateral interstitial edema. Electronically Signed   By: Abelardo Diesel M.D.   On: 09/30/2018 14:01   Pelvis Portable  Result Date: 09/29/2018 CLINICAL DATA:  Left hip replacement EXAM: PORTABLE PELVIS 1-2 VIEWS COMPARISON:  Left hip CT 09/28/2018 FINDINGS: There is a left hip hemiarthroplasty now present. Alignment is normal. There is expected postoperative gas overlying the left greater trochanter. IMPRESSION: Expected postoperative appearance of left hip hemiarthroplasty. Electronically Signed   By: Ulyses Jarred M.D.   On: 09/29/2018 18:44   Ct Hip Left Wo Contrast  Result Date: 09/28/2018 CLINICAL DATA:  [place].  Left hip pain. EXAM:  CT OF THE LEFT HIP WITHOUT CONTRAST TECHNIQUE: Multidetector CT imaging of the left hip was performed according to the standard protocol. Multiplanar CT image reconstructions were also generated. COMPARISON:  Radiographs 09/28/2018 FINDINGS: There is an impacted femoral neck fracture with very slight external rotation of the femoral head in relation to the femoral neck. Moderate hip joint degenerative changes. The acetabulum is intact. The visualized left hemipelvis is intact. The pubic symphysis and visualized left SI joint appear normal. No significant intrapelvic abnormalities are identified on the left side. No inguinal mass or hernia. IMPRESSION: 1. Impacted left femoral neck fracture. 2. Intact left bony hemipelvis. Electronically Signed   By: Marijo Sanes M.D.   On: 09/28/2018 15:31   Dg Hip Unilat With Pelvis 2-3 Views Left  Result Date: 09/28/2018 CLINICAL DATA:  85 year old who fell this morning and  injured the LEFT hip. Initial encounter. EXAM: DG HIP (WITH OR WITHOUT PELVIS) 2-3V LEFT COMPARISON:  None. FINDINGS: The LEFT hip is internally rotated which I believe accounts for the foreshortening of the femoral neck, though an impacted intertrochanteric fracture could have a similar appearance. Joint space well-preserved for age. Bone mineral density relatively well preserved for age. Included AP pelvis demonstrates mild-to-moderate narrowing of the joint space in the contralateral RIGHT hip. Sacroiliac joints and symphysis pubis anatomically aligned without diastasis. Degenerative changes involving the visualized lower lumbar spine. IMPRESSION: Apparent foreshortening of the LEFT femoral neck I believe is related to the fact that the LEFT hip is internally rotated, though an impacted intertrochanteric fracture could have a similar appearance. If there is strong clinical concern for fracture, CT may be helpful in further evaluation. Electronically Signed   By: Evangeline Dakin M.D.   On: 09/28/2018 13:29    Microbiology: Recent Results (from the past 240 hour(s))  Urine culture     Status: None   Collection Time: 09/28/18  1:39 PM  Result Value Ref Range Status   Specimen Description   Final    URINE, RANDOM Performed at [facility] [address], [place], [place] [address]    Special Requests   Final    Normal Performed at [facility], [place] [address] [place], [place] [address]    Culture   Final    NO GROWTH Performed at [facility], [place] [address] [place], [place] [address]    Report Status 09/30/2018 FINAL  Final  Surgical PCR screen     Status: None   Collection Time: 09/28/18 10:35 PM  Result Value Ref Range Status   MRSA, PCR NEGATIVE NEGATIVE Final   Staphylococcus aureus NEGATIVE NEGATIVE Final    Comment: (NOTE) The Xpert SA Assay (FDA approved for NASAL specimens in patients 82 years of age and older), is one component of a  comprehensive surveillance program. It is not intended to diagnose infection nor to guide or monitor treatment. Performed at [facility], [place] [address], [place], [place] [address]      Labs: Basic Metabolic Panel: Recent Labs  Lab 09/30/18 0251 10/01/18 0353 10/02/18 0440 10/03/18 0320 10/04/18 0400 10/05/18 0327 10/06/18 1544  NA 136 139 136 136 138 139 136  K 3.8 3.4* 3.2* 3.2* 3.0* 4.7 4.4  CL 100 98 95* 96* 96* 102 102  CO2 27 30 32 32 31 30 26   GLUCOSE 145* 139* 117* 123* 126* 109* 144*  BUN 19 23 24* 24* 24* 26* 29*  CREATININE 1.11* 1.19* 1.04* 1.06* 1.10* 1.11* 1.09*  CALCIUM 8.1* 8.4* 8.0* 8.2* 8.6* 8.4* 8.1*  PHOS 3.2 2.6  --  3.2  --  3.5  --    Liver Function Tests: Recent Labs  Lab 10/01/18 0353 10/03/18 0320 10/04/18 0400 10/05/18 0327 10/06/18 1544  AST  --   --  70*  --  42*  ALT  --   --  50*  --  41  ALKPHOS  --   --  67  --  78  BILITOT  --   --  1.1  --  0.8  PROT  --   --  5.6*  --  5.6*  ALBUMIN 2.9* 2.3* 2.4* 2.4* 2.4*   No results for input(s): LIPASE, AMYLASE in the last 168 hours. No results for input(s): AMMONIA in the last 168 hours. CBC: Recent Labs  Lab 10/02/18 0440 10/03/18 0320 10/04/18 0400 10/05/18 0327 10/06/18 1400  WBC 12.3* 11.1* 13.1* 13.6* 13.1*  NEUTROABS 8.2* 7.1 9.2* 9.0*  --   HGB 9.9* 9.6* 10.0* 10.5* 10.9*  HCT 29.9* 29.4* 30.3* 31.0* 34.2*  MCV 94.9 94.5 94.4 93.9 98.3  PLT 197 232 309 268 527*   Cardiac Enzymes: No results for input(s): CKTOTAL, CKMB, CKMBINDEX, TROPONINI in the last 168 hours. BNP: BNP (last 3 results) Recent Labs    09/28/18 1714  BNP 158.5*    ProBNP (last 3 results) No results for input(s): PROBNP in the last 8760 hours.  CBG: No results for input(s): GLUCAP in the last 168 hours.     Signed:  Cristal Ford  [facility] 10/06/2018, 4:41 PM

## 2018-10-06 NOTE — Progress Notes (Signed)
CSW received a call from pt's RN stating provider stats pt is ready for D/C. To [facility].  CSW called Margaretann Loveless [phone] and Narda Rutherford in admissions at ph:[phone] at the SNF but both were gone for the day.  CSW called admin at front desk of SNF who is unaware of the pt.    CSW informed RN who has completed D/C packet pt cannot return until the morning of 3/5  When social work can facilitate D/C via [place] in admissions on 3/5.  RN updated and will update provider.  CSW will continue to follow for D/C needs.  Kathryn [name]. Kathryn Minner, LCSW, LCAS, CSI Clinical Social Worker Ph: [phone]

## 2018-10-06 NOTE — Progress Notes (Signed)
Patient foley removed per night shift 0645hrs.  Patient placed on BSC and voided 20cc of dark amber urine at 1045hrs.  Bladder scan done, indicates 185cc of urine.  Patient denies need to void at this time.  Will continue to monitor u/o.

## 2018-10-06 NOTE — Progress Notes (Signed)
SW J Riffey notified RN that patient to go to [facility].  Husband called and notified of transfer.  Report called to [place] at [phone].  Night shift RN updated.  Awaiting PTAR for transfer.

## 2018-10-06 NOTE — Progress Notes (Signed)
Physical Therapy Treatment Patient Details Name: Kathryn [name] MRN: [id number] DOB: Apr 09, 1934 Today's Date: 10/06/2018    History of Present Illness Patient is an 85 y/o female with a past medical history of dementia, recent UTI, depression and anxiety.  Presenting to the ED on 09/28/18 s/p fall at home with resultant L hip fracture. L hip hemiarthroplasty on 09/29/2018.    PT Comments    Pt admitted with above diagnosis. Pt currently with functional limitations due to balance and endurance deficits. Pt was able to ambulate a much further distance in hallway. Pt min guard to min  assist and cues with RW continuing to need cues and assist for safety.  Will follow acutely.  Pt will benefit from skilled PT to increase their independence and safety with mobility to allow discharge to the venue listed below.     Follow Up Recommendations  SNF;Supervision/Assistance - 24 hour     Equipment Recommendations  Other (comment)(defer)    Recommendations for Other Services       Precautions / Restrictions Precautions Precautions: Fall;Posterior Hip Precaution Comments: reviewed with patient and husband - wrote on room board Restrictions Weight Bearing Restrictions: No LLE Weight Bearing: Weight bearing as tolerated    Mobility  Bed Mobility Overal bed mobility: Needs Assistance Bed Mobility: Supine to Sit     Supine to sit: Min assist;HOB elevated;Min guard     General bed mobility comments: Pt initiated movement and follows commands if given time.  use of bed pad to move hips and pt used rail to pull up and elevate trunk.  Much less assist needed today.   Transfers Overall transfer level: Needs assistance Equipment used: Rolling walker (2 wheeled) Transfers: Sit to/from Stand Sit to Stand: +2 safety/equipment;From elevated surface;Min assist         General transfer comment: Pt able to bear weight through B LE; Needed help to put hands on appropriate place on RW.  Pt stood well  to RW with cues. Cues for hip precautions.   Ambulation/Gait Ambulation/Gait assistance: Min assist;+2 safety/equipment Gait Distance (Feet): 450 Feet Assistive device: Rolling walker (2 wheeled) Gait Pattern/deviations: Step-to pattern;Decreased step length - right;Decreased stance time - left;Decreased weight shift to left;Antalgic   Gait velocity interpretation: <1.31 ft/sec, indicative of household ambulator General Gait Details: Pt was able to ambulate in hallway with cues for sequencing steps and RW.  Occasional assist steering RW.  Pt followed with chair but made it back to room.  VSS   Stairs             Wheelchair Mobility    Modified Rankin (Stroke Patients Only)       Balance Overall balance assessment: Needs assistance Sitting-balance support: Bilateral upper extremity supported;Feet supported Sitting balance-Leahy Scale: Fair Sitting balance - Comments: Min guard asssit to sit EOB x 5 min   Standing balance support: Bilateral upper extremity supported;During functional activity Standing balance-Leahy Scale: Fair Standing balance comment: can stand statically wihtout UEs upport.                             Cognition Arousal/Alertness: Awake/alert Behavior During Therapy: WFL for tasks assessed/performed Overall Cognitive Status: History of cognitive impairments - at baseline                                 General Comments: Pt follows commands inconsistently and with repetiion  Exercises General Exercises - Lower Extremity Ankle Circles/Pumps: AROM;Both;Supine;20 reps Quad Sets: AROM;Both;10 reps;Supine Long Arc Quad: AROM;Both;10 reps;Seated Heel Slides: AAROM;Both;Supine;10 reps Hip ABduction/ADduction: AROM;Both;10 reps;Supine    General Comments General comments (skin integrity, edema, etc.): spouse present and attentive      Pertinent Vitals/Pain Pain Assessment: No/denies pain    Home Living                       Prior Function            PT Goals (current goals can now be found in the care plan section) Acute Rehab PT Goals Patient Stated Goal: none stated Progress towards PT goals: Progressing toward goals    Frequency    Min 2X/week      PT Plan Current plan remains appropriate    Co-evaluation              AM-PAC PT "6 Clicks" Mobility   Outcome Measure  Help needed turning from your back to your side while in a flat bed without using bedrails?: A Lot Help needed moving from lying on your back to sitting on the side of a flat bed without using bedrails?: A Little Help needed moving to and from a bed to a chair (including a wheelchair)?: A Little Help needed standing up from a chair using your arms (e.g., wheelchair or bedside chair)?: A Little Help needed to walk in hospital room?: A Little Help needed climbing 3-5 steps with a railing? : Total 6 Click Score: 15    End of Session Equipment Utilized During Treatment: Gait belt Activity Tolerance: Patient limited by fatigue Patient left: in chair;with call bell/phone within reach;with chair alarm set;with family/visitor present Nurse Communication: Mobility status PT Visit Diagnosis: Unsteadiness on feet (R26.81);Other abnormalities of gait and mobility (R26.89);Muscle weakness (generalized) (M62.81);History of falling (Z91.81)     Time: 4970-2637 PT Time Calculation (min) (ACUTE ONLY): 20 min  Charges:  $Gait Training: 8-22 mins                     [place] Pager:  [phone]  Office:  [place] 10/06/2018, 1:12 PM

## 2018-10-06 NOTE — Progress Notes (Signed)
Patient placed on BSC, voided 200cc dark amber urine.  Bladder scan done, 40cc in bladder. Patient denies feeling need to void at this time.

## 2018-10-06 NOTE — Progress Notes (Addendum)
CSW received a call from [place] has been accepted by: [facility] Number for report is: [phone] Pt's unit/room/bed number will be: Room 75 Accepting physician: SNF MD  Pt can arrive ASAP on 10/06/2018   CSW will update RN.  Alphonse Guild. Cherron Blitzer, LCSW, LCAS, CSI Clinical Social Worker Ph: [phone]

## 2018-10-06 NOTE — Telephone Encounter (Signed)
Copied from [place] [id number]. Topic: General - Other >> Oct 05, 2018  3:45 PM Leward Quan A wrote: Reason for CRM: Patient husband called to say that he had a missed call but was not sure who it was. But he said when wife leaves the hospital she will be going to [facility] where she will be placed after hospital discharge. He states that if anyone need to reach him please do so on his cell Ph# [phone]

## 2018-10-07 ENCOUNTER — Other Ambulatory Visit: Payer: Self-pay | Admitting: *Deleted

## 2018-10-07 DIAGNOSIS — J189 Pneumonia, unspecified organism: Secondary | ICD-10-CM | POA: Diagnosis not present

## 2018-10-07 DIAGNOSIS — S72002D Fracture of unspecified part of neck of left femur, subsequent encounter for closed fracture with routine healing: Secondary | ICD-10-CM | POA: Diagnosis not present

## 2018-10-07 DIAGNOSIS — F0390 Unspecified dementia without behavioral disturbance: Secondary | ICD-10-CM | POA: Diagnosis not present

## 2018-10-07 DIAGNOSIS — R338 Other retention of urine: Secondary | ICD-10-CM | POA: Diagnosis not present

## 2018-10-07 LAB — PATHOLOGIST SMEAR REVIEW: PATH REVIEW: NORMAL

## 2018-10-07 NOTE — Telephone Encounter (Signed)
Pt's husband called (DPR). He advised pt was moved to [facility] late last night. He is not happy with transfer and lack of communication from hospital and nursing home staff. He reports pt has not been seen by any provider at the facility. She did have to have new catheter placed last night and it was not a very easy procedure. Husband reports pt is not feeling well, is shaking and has chills. He is worried about her getting another infection. I advised him to speak with charge nurse or director of facility, that someone should be coming to evaluate pt upon admission. He states he has not seen anyone come to check on pt. He is very upset about her care and lack of communication from nurses. Advised husband to wait and see if provider comes to check her today and if not to let us know.   Dr. [name] - husband wanted to speak with you, advised you are not in office today. He appreciates your trying to call them the other day and he apologizes for missing your call. Please call husband if able. Thanks!

## 2018-10-07 NOTE — Patient Outreach (Signed)
Referral received from call a nurse line report, pt husband called in and reports he is having difficulty with facility where his wife is currently admitted after fracturing her hip one week ago, issues with urinary retention, foley catheter being taken out, no bed rails, no bed alarm.  Rn CM called patient's husband Hope Brandenburger at [phone], no answer to telephone and left voicemail requesting return phone call.  Rn CM mailed unsuccessful outreach letter to patient's home.  PLAN Outreach patient's husband in 3-4 business days  Jacqlyn Larsen [facility], [place] Coordinator [phone]

## 2018-10-08 NOTE — Telephone Encounter (Signed)
Spoke with Mr Zentz in regard in regard to recent fall complicated with hip fracture. S/P hemiarthroplasty of left hip. She has been transferred to a skilled nurse facility to start rehab. Otherwise she is stable.  He is concerned about possible UTI, we reviewed recent urine culture (09/29/2027 not grown), he is relief. He is frustrated because he has not been able to see results of recent labs.   We discussed some results of the labs done in the hospital, no significant abnormalities and stable, he feels reassured. He will keep me informed about any significant change in Ms Kopecky status.  Betty [name], MD

## 2018-10-13 ENCOUNTER — Ambulatory Visit: Payer: Self-pay | Admitting: *Deleted

## 2018-10-13 NOTE — Patient Outreach (Signed)
Outreach call (2nd attempt) to patient's spouse Rema Jasmine, HIPAA verified, RN CM explained purpose of call to follow up on call a nurse line from last week, Mr. Wehling states his wife is still at [facility] but due to state order for visitor restrictions, he is not allowed to go in and see her, last saw her yesterday. Reports pt no longer has foley cathter and is urinating well, states " she is making progress, they have a very good ortho program"  States " there's just some things I don't like such as me not being able to visit"  RN CM called [facility] and they did not answer the phone, pt states he has Elderlaw attorney calling him back, states he is talking to attorney about any further issues he may have, Mr. Dick appreciative of call and verbalizes understanding to call RN CM back if needed for further assistance.  Jacqlyn Larsen [facility], [place] Coordinator [phone]

## 2018-10-14 DIAGNOSIS — J189 Pneumonia, unspecified organism: Secondary | ICD-10-CM | POA: Diagnosis not present

## 2018-10-14 DIAGNOSIS — S72002D Fracture of unspecified part of neck of left femur, subsequent encounter for closed fracture with routine healing: Secondary | ICD-10-CM | POA: Diagnosis not present

## 2018-10-14 DIAGNOSIS — R338 Other retention of urine: Secondary | ICD-10-CM | POA: Diagnosis not present

## 2018-10-14 DIAGNOSIS — L89152 Pressure ulcer of sacral region, stage 2: Secondary | ICD-10-CM | POA: Diagnosis not present

## 2018-10-15 ENCOUNTER — Telehealth (INDEPENDENT_AMBULATORY_CARE_PROVIDER_SITE_OTHER): Payer: Self-pay | Admitting: Orthopaedic Surgery

## 2018-10-15 NOTE — Telephone Encounter (Signed)
Nurse or PA [facility] can look at hip wound and see if staples are ready to be removed.  If so remove staples tincture benzoin and Steri-Strip application.

## 2018-10-15 NOTE — Telephone Encounter (Signed)
Lennette Bihari from [facility] called stating that the patient needs to f/u with Dr. Lorin Mercy, but they have been put on lock down unless the patient needs to go to chemo or dialysis.  Lennette Bihari wanted to know if they could take the stitches out there at the Rehab center.  CB#[phone].  Thank you.

## 2018-10-15 NOTE — Telephone Encounter (Signed)
Please advise 

## 2018-10-15 NOTE — Telephone Encounter (Signed)
I called Lennette Bihari and advised.

## 2018-10-18 DIAGNOSIS — J189 Pneumonia, unspecified organism: Secondary | ICD-10-CM | POA: Diagnosis not present

## 2018-10-18 DIAGNOSIS — F0390 Unspecified dementia without behavioral disturbance: Secondary | ICD-10-CM | POA: Diagnosis not present

## 2018-10-18 DIAGNOSIS — S72009A Fracture of unspecified part of neck of unspecified femur, initial encounter for closed fracture: Secondary | ICD-10-CM | POA: Diagnosis not present

## 2018-10-18 DIAGNOSIS — N183 Chronic kidney disease, stage 3 (moderate): Secondary | ICD-10-CM | POA: Diagnosis not present

## 2018-10-20 ENCOUNTER — Other Ambulatory Visit: Payer: Self-pay | Admitting: *Deleted

## 2018-10-20 NOTE — Patient Outreach (Signed)
[facility]) Care Management  10/20/2018  Kathryn [name] 12/08/1933 [id number]   Collaboration [facility] after telephonic IDT meeting Patient is in SNF at [facility].  Patient has history of CKD, lives with spouse. Facility reported patient progressing slow, the facility has visitor restrictions and patient not doing well without seeing her spouse.  [facility] a [facility].   Plan to monitor for [facility] care management needs and refer upon discharge. No discharge date set at this time.  [name]. Laymond Purser, MSN, RN, [employer] , [place] [phone]) Business Cell  [phone]) Toll Free Office

## 2018-10-21 DIAGNOSIS — S72002D Fracture of unspecified part of neck of left femur, subsequent encounter for closed fracture with routine healing: Secondary | ICD-10-CM | POA: Diagnosis not present

## 2018-10-21 DIAGNOSIS — J189 Pneumonia, unspecified organism: Secondary | ICD-10-CM | POA: Diagnosis not present

## 2018-10-21 DIAGNOSIS — F0390 Unspecified dementia without behavioral disturbance: Secondary | ICD-10-CM | POA: Diagnosis not present

## 2018-10-21 DIAGNOSIS — R338 Other retention of urine: Secondary | ICD-10-CM | POA: Diagnosis not present

## 2018-10-25 ENCOUNTER — Encounter: Payer: Self-pay | Admitting: Family Medicine

## 2018-10-26 DIAGNOSIS — R338 Other retention of urine: Secondary | ICD-10-CM | POA: Diagnosis not present

## 2018-10-26 DIAGNOSIS — E7849 Other hyperlipidemia: Secondary | ICD-10-CM | POA: Diagnosis not present

## 2018-10-26 DIAGNOSIS — S72002D Fracture of unspecified part of neck of left femur, subsequent encounter for closed fracture with routine healing: Secondary | ICD-10-CM | POA: Diagnosis not present

## 2018-10-26 DIAGNOSIS — F0390 Unspecified dementia without behavioral disturbance: Secondary | ICD-10-CM | POA: Diagnosis not present

## 2018-10-27 ENCOUNTER — Other Ambulatory Visit: Payer: Self-pay | Admitting: *Deleted

## 2018-10-27 NOTE — Patient Outreach (Signed)
[facility]) Care Management  10/27/2018  Kathryn [name] 1933/12/01 [id number]  Collaboration with [facility] UM nurse, patient set to discharge home with spouse and HH.  Plan to place [facility] referral for follow up by care management after discharge from [facility] on 10/29/18  [facility]. Laymond Purser, MSN, AGNP-C, [place] [phone]) Business Cell  [phone]) Toll Free Office

## 2018-11-01 ENCOUNTER — Telehealth: Payer: Self-pay | Admitting: *Deleted

## 2018-11-01 ENCOUNTER — Other Ambulatory Visit: Payer: Self-pay | Admitting: *Deleted

## 2018-11-01 DIAGNOSIS — S51811D Laceration without foreign body of right forearm, subsequent encounter: Secondary | ICD-10-CM | POA: Diagnosis not present

## 2018-11-01 DIAGNOSIS — I129 Hypertensive chronic kidney disease with stage 1 through stage 4 chronic kidney disease, or unspecified chronic kidney disease: Secondary | ICD-10-CM | POA: Diagnosis not present

## 2018-11-01 DIAGNOSIS — I739 Peripheral vascular disease, unspecified: Secondary | ICD-10-CM | POA: Diagnosis not present

## 2018-11-01 DIAGNOSIS — J9621 Acute and chronic respiratory failure with hypoxia: Secondary | ICD-10-CM | POA: Diagnosis not present

## 2018-11-01 DIAGNOSIS — S7292XD Unspecified fracture of left femur, subsequent encounter for closed fracture with routine healing: Secondary | ICD-10-CM | POA: Diagnosis not present

## 2018-11-01 DIAGNOSIS — Z9181 History of falling: Secondary | ICD-10-CM | POA: Diagnosis not present

## 2018-11-01 DIAGNOSIS — Z87440 Personal history of urinary (tract) infections: Secondary | ICD-10-CM | POA: Diagnosis not present

## 2018-11-01 DIAGNOSIS — D631 Anemia in chronic kidney disease: Secondary | ICD-10-CM | POA: Diagnosis not present

## 2018-11-01 DIAGNOSIS — F329 Major depressive disorder, single episode, unspecified: Secondary | ICD-10-CM | POA: Diagnosis not present

## 2018-11-01 DIAGNOSIS — R1311 Dysphagia, oral phase: Secondary | ICD-10-CM | POA: Diagnosis not present

## 2018-11-01 DIAGNOSIS — Z7982 Long term (current) use of aspirin: Secondary | ICD-10-CM | POA: Diagnosis not present

## 2018-11-01 DIAGNOSIS — R339 Retention of urine, unspecified: Secondary | ICD-10-CM | POA: Diagnosis not present

## 2018-11-01 DIAGNOSIS — N183 Chronic kidney disease, stage 3 (moderate): Secondary | ICD-10-CM | POA: Diagnosis not present

## 2018-11-01 DIAGNOSIS — F0390 Unspecified dementia without behavioral disturbance: Secondary | ICD-10-CM | POA: Diagnosis not present

## 2018-11-01 DIAGNOSIS — I4891 Unspecified atrial fibrillation: Secondary | ICD-10-CM | POA: Diagnosis not present

## 2018-11-01 NOTE — Telephone Encounter (Signed)
Please arrange a Webex appt. Thanks, BJ

## 2018-11-01 NOTE — Telephone Encounter (Signed)
Spoke with Kathryn [name] and he stated that since we talked this morning, he has heard from [facility] and they are coming out today around 2 pm. He will call after they leave to schedule Webex visit for tomorrow.

## 2018-11-01 NOTE — Patient Outreach (Signed)
[facility]) Care Management  11/01/2018  Kathryn [name] July 23, 1934 [id number]   Referral received from post acute care coordinator as member was recently admitted to rehab/SNF after hip surgery due to fracture.  Notified that member would be discharged on 3/27.  Per chart, she has history of hypertension, PAD, Dementia, CKD, and hyperlipidemia.  Call placed to listed number to speak with member or husband, no answer.  Unable to leave message.  Unsuccessful outreach letter sent, will follow up within the next 3-4 business days.  Valente David, [place], MSN [place] [phone]

## 2018-11-01 NOTE — Telephone Encounter (Signed)
Mr. Kathryn [name] called to inform PCP that patient was released from [facility]. While at rehab, patient has sustained a wound that needs to be changed. Wound care was done on 10/28/2018 by Ritta Slot. Mr. Kathryn [name] calling to find out what he need to do, would like for PCP to call him personally to discuss.

## 2018-11-02 ENCOUNTER — Telehealth: Payer: Self-pay | Admitting: Family Medicine

## 2018-11-02 ENCOUNTER — Other Ambulatory Visit: Payer: PPO

## 2018-11-02 NOTE — Telephone Encounter (Signed)
Spoke with patient's husband and he stated that he would contact office when they are ready to schedule the web visit. Mr. Robel also stated that the home health nurse would contact office to get order for any labs that were needed and have someone draw them.

## 2018-11-02 NOTE — Telephone Encounter (Signed)
Copied from [place] [id number]. Topic: Quick Communication - Home Health Verbal Orders >> Nov 02, 2018 10:09 PM Percell Belt A wrote: Caller/Agency: Anna Genre with [facility] at home  Callback Number: [phone] Requesting OT/PT/Skilled Nursing/Social Work/Speech Therapy: requesting verbal orders for speech therapy  Frequency:  1 week 1 2 week 4

## 2018-11-03 ENCOUNTER — Telehealth: Payer: Self-pay | Admitting: Family Medicine

## 2018-11-03 DIAGNOSIS — I4891 Unspecified atrial fibrillation: Secondary | ICD-10-CM | POA: Diagnosis not present

## 2018-11-03 DIAGNOSIS — Z9181 History of falling: Secondary | ICD-10-CM | POA: Diagnosis not present

## 2018-11-03 DIAGNOSIS — Z7982 Long term (current) use of aspirin: Secondary | ICD-10-CM | POA: Diagnosis not present

## 2018-11-03 DIAGNOSIS — N183 Chronic kidney disease, stage 3 (moderate): Secondary | ICD-10-CM | POA: Diagnosis not present

## 2018-11-03 DIAGNOSIS — I739 Peripheral vascular disease, unspecified: Secondary | ICD-10-CM | POA: Diagnosis not present

## 2018-11-03 DIAGNOSIS — S7292XD Unspecified fracture of left femur, subsequent encounter for closed fracture with routine healing: Secondary | ICD-10-CM | POA: Diagnosis not present

## 2018-11-03 DIAGNOSIS — S51811D Laceration without foreign body of right forearm, subsequent encounter: Secondary | ICD-10-CM | POA: Diagnosis not present

## 2018-11-03 DIAGNOSIS — F0390 Unspecified dementia without behavioral disturbance: Secondary | ICD-10-CM | POA: Diagnosis not present

## 2018-11-03 DIAGNOSIS — R339 Retention of urine, unspecified: Secondary | ICD-10-CM | POA: Diagnosis not present

## 2018-11-03 DIAGNOSIS — I129 Hypertensive chronic kidney disease with stage 1 through stage 4 chronic kidney disease, or unspecified chronic kidney disease: Secondary | ICD-10-CM | POA: Diagnosis not present

## 2018-11-03 DIAGNOSIS — F329 Major depressive disorder, single episode, unspecified: Secondary | ICD-10-CM | POA: Diagnosis not present

## 2018-11-03 DIAGNOSIS — Z87440 Personal history of urinary (tract) infections: Secondary | ICD-10-CM | POA: Diagnosis not present

## 2018-11-03 DIAGNOSIS — R1311 Dysphagia, oral phase: Secondary | ICD-10-CM | POA: Diagnosis not present

## 2018-11-03 DIAGNOSIS — D631 Anemia in chronic kidney disease: Secondary | ICD-10-CM | POA: Diagnosis not present

## 2018-11-03 DIAGNOSIS — J9621 Acute and chronic respiratory failure with hypoxia: Secondary | ICD-10-CM | POA: Diagnosis not present

## 2018-11-03 NOTE — Telephone Encounter (Signed)
Copied from [place] [phone]. Topic: Quick Communication - Home Health Verbal Orders >> Nov 03, 2018 11:26 AM [name] wrote: Kathryn [name] also is requesting home health aid 3x a week for 3 months. Pt was taking Prevastatin 50mg  but was changed to 10 mg and the husband is requesting clarification on which one she should be taking. Kathryn [name] also states pt has a skin tear that requires dressing. It is healing well with the oform border foam, but wants to eventually change to Tegaderm dressing.   Debbie's confidential line is [phone].

## 2018-11-03 NOTE — Telephone Encounter (Signed)
Left detailed message on machine for:  Anna Genre with [facility] at home  Callback Number: [phone] With verbal orders for speech therapy.

## 2018-11-03 NOTE — Telephone Encounter (Signed)
Copied from [place] [id number]. Topic: Quick Communication - Home Health Verbal Orders >> Nov 03, 2018 11:07 AM Yvette Rack wrote: Caller/Agency: Debbie with [facility] Number: [phone] Requesting OT/PT/Skilled Nursing/Social Work/Speech Therapy: skilled nursing and medication management Frequency: 2 times a week for 1 week, 1 time a week for 2 weeks, 1 time a month for 2 months  Jackelyn Poling stated pt husband requests that the Rx for Aspirin be changed back to 81 MG

## 2018-11-03 NOTE — Telephone Encounter (Signed)
Verbal authorization can be given for requested services. Thanks, BJ 

## 2018-11-03 NOTE — Telephone Encounter (Signed)
I believe she is on Aspirin 325 mg because recent hip surgery and for DVT prophylaxis. She has no Hx of CVD (strokes,recurrent DVT's, atrial fib,or CAD), so she does not need to continue aspirin after completing 3-4 weeks and when ambulatory. If still he wants to change Aspirin dose from 325 mg to 81 mg,he can do so.  Thanks, BJ

## 2018-11-04 ENCOUNTER — Telehealth: Payer: Self-pay | Admitting: Family Medicine

## 2018-11-04 NOTE — Telephone Encounter (Signed)
Copied from [place] [id number]. Topic: Quick Communication - Home Health Verbal Orders >> Nov 04, 2018  1:45 PM Leward Quan A wrote: Caller/Agency: Dana Allan / [facility] Number: [phone] [place] [facility] Requesting OT/PT/Skilled Nursing/Social Work/Speech Therapy: Skilled Nursing and Home Health Aide Frequency: PRN + Previous request

## 2018-11-04 NOTE — Telephone Encounter (Signed)
Debbie calling again for verbal order. She states she needs this approved by tomorrow in order to see the patient.

## 2018-11-05 ENCOUNTER — Other Ambulatory Visit: Payer: Self-pay | Admitting: *Deleted

## 2018-11-05 ENCOUNTER — Other Ambulatory Visit: Payer: Self-pay

## 2018-11-05 NOTE — Telephone Encounter (Signed)
There is no Pravastatin 50 mg. She can continue Pravastatin 10 mg. Verbal authorization for requested services can be given including wound evaluation and care.   In the future requested [place] orders for pts can be given verbally at the time they are requested, this applies for PT,OT,and wound care.  Thanks,  BJ

## 2018-11-05 NOTE — Telephone Encounter (Signed)
Verbal orders and recommendations given to [place].

## 2018-11-05 NOTE — Telephone Encounter (Signed)
Left detailed for Kathryn [name] with verbal orders as requested.

## 2018-11-05 NOTE — Patient Outreach (Signed)
[place] [facility]) Care Management  11/05/2018  Kathryn [name] April 04, 1934 [id number]   Attempted to reach member/husand for transition of care for the second time, unsuccessful.  HIPAA compliant voice message left.  Will follow up with third attempt within the next 3-4 business days.    Update:  Call received back from member's husband.  [facility] car management services explained.  He report frustration regarding multiple agencies calling, stating it has been overwhelming with trying to care for member.  He confirms that she has [facility] for nursing, OT, PT, SW, and home aide.  State he would prefer not to be involved with [facility] at this time, but verbalizes understanding that [facility] is only short term.  He does agree to have this care manager send contact information and state he will contact with additional needs, if any, once [facility] services are complete.    Will notify primary MD of case closure and will send member successful outreach letter with this care manager's contact information.  Kathryn [name], [place], MSN [place] [phone]

## 2018-11-06 ENCOUNTER — Other Ambulatory Visit: Payer: Self-pay | Admitting: Neurology

## 2018-11-09 DIAGNOSIS — I129 Hypertensive chronic kidney disease with stage 1 through stage 4 chronic kidney disease, or unspecified chronic kidney disease: Secondary | ICD-10-CM | POA: Diagnosis not present

## 2018-11-09 DIAGNOSIS — J9621 Acute and chronic respiratory failure with hypoxia: Secondary | ICD-10-CM | POA: Diagnosis not present

## 2018-11-09 DIAGNOSIS — D631 Anemia in chronic kidney disease: Secondary | ICD-10-CM | POA: Diagnosis not present

## 2018-11-09 DIAGNOSIS — I739 Peripheral vascular disease, unspecified: Secondary | ICD-10-CM | POA: Diagnosis not present

## 2018-11-09 DIAGNOSIS — N183 Chronic kidney disease, stage 3 (moderate): Secondary | ICD-10-CM | POA: Diagnosis not present

## 2018-11-09 DIAGNOSIS — Z9181 History of falling: Secondary | ICD-10-CM | POA: Diagnosis not present

## 2018-11-09 DIAGNOSIS — Z87440 Personal history of urinary (tract) infections: Secondary | ICD-10-CM | POA: Diagnosis not present

## 2018-11-09 DIAGNOSIS — R339 Retention of urine, unspecified: Secondary | ICD-10-CM | POA: Diagnosis not present

## 2018-11-09 DIAGNOSIS — F0390 Unspecified dementia without behavioral disturbance: Secondary | ICD-10-CM | POA: Diagnosis not present

## 2018-11-09 DIAGNOSIS — I4891 Unspecified atrial fibrillation: Secondary | ICD-10-CM | POA: Diagnosis not present

## 2018-11-09 DIAGNOSIS — S7292XD Unspecified fracture of left femur, subsequent encounter for closed fracture with routine healing: Secondary | ICD-10-CM | POA: Diagnosis not present

## 2018-11-09 DIAGNOSIS — S51811D Laceration without foreign body of right forearm, subsequent encounter: Secondary | ICD-10-CM | POA: Diagnosis not present

## 2018-11-09 DIAGNOSIS — Z7982 Long term (current) use of aspirin: Secondary | ICD-10-CM | POA: Diagnosis not present

## 2018-11-09 DIAGNOSIS — R1311 Dysphagia, oral phase: Secondary | ICD-10-CM | POA: Diagnosis not present

## 2018-11-09 DIAGNOSIS — F329 Major depressive disorder, single episode, unspecified: Secondary | ICD-10-CM | POA: Diagnosis not present

## 2018-11-09 NOTE — Telephone Encounter (Signed)
Med d/c upon d/c from hospital 10/06/2018, no reason given for stopping it. Will d/w Dr. [name].

## 2018-11-10 ENCOUNTER — Encounter: Payer: Self-pay | Admitting: Family Medicine

## 2018-11-10 ENCOUNTER — Ambulatory Visit (INDEPENDENT_AMBULATORY_CARE_PROVIDER_SITE_OTHER): Payer: PPO | Admitting: Family Medicine

## 2018-11-10 ENCOUNTER — Other Ambulatory Visit: Payer: Self-pay

## 2018-11-10 DIAGNOSIS — G3109 Other frontotemporal dementia: Secondary | ICD-10-CM

## 2018-11-10 DIAGNOSIS — F0280 Dementia in other diseases classified elsewhere without behavioral disturbance: Secondary | ICD-10-CM | POA: Diagnosis not present

## 2018-11-10 DIAGNOSIS — I739 Peripheral vascular disease, unspecified: Secondary | ICD-10-CM

## 2018-11-10 DIAGNOSIS — R5383 Other fatigue: Secondary | ICD-10-CM

## 2018-11-10 DIAGNOSIS — E7849 Other hyperlipidemia: Secondary | ICD-10-CM | POA: Diagnosis not present

## 2018-11-10 DIAGNOSIS — R7401 Elevation of levels of liver transaminase levels: Secondary | ICD-10-CM

## 2018-11-10 DIAGNOSIS — R740 Nonspecific elevation of levels of transaminase and lactic acid dehydrogenase [LDH]: Secondary | ICD-10-CM | POA: Diagnosis not present

## 2018-11-10 DIAGNOSIS — R2681 Unsteadiness on feet: Secondary | ICD-10-CM | POA: Diagnosis not present

## 2018-11-10 NOTE — Assessment & Plan Note (Signed)
Continue pravastatin 20 mg daily. We will plan on checking lipid panel in 6 to 12 months.

## 2018-11-10 NOTE — Assessment & Plan Note (Signed)
PT and OT referral has been placed already. Social worker is visiting today, we will follow recommendations in regard to PT approval. Stressed the importance of fall precautions.

## 2018-11-10 NOTE — Assessment & Plan Note (Signed)
According to information provided by husband, problem seems to be stable. I think the best place for her to be is home. No changes in current management Continue following with neurologist.

## 2018-11-10 NOTE — Progress Notes (Signed)
Virtual Visit via Telephone Note  I connected with LILYAHNA SIRMON 's husband and Jackelyn Poling [facility] nurse) on 11/10/18 at 11:00 AM EDT by telephone and verified that I am speaking with the correct person using two identifiers.   I discussed the limitations, risks, security and privacy concerns of performing an evaluation and management service by telephone and the availability of in person appointments. I also discussed with the patient that there may be a patient responsible charge related to this service. The patient expressed understanding and agreed to proceed.  Location patient: home Location provider:home office Participants present for the call: patient, provider Patient did not have a visit in the prior 7 days to address this/these issue(s).   History of Present Illness:  Ms Lesueur is a 85 yo female with Hx of dementia,unstable gait,OA,and PAD among some, who underwent left hip hemiarthroplasty on 09/29/2018. On 09/28/2018 she presented to the ER after a fall at home complicated by left hip fracture. She was discharged to a SNF and has been at home since 10/26/2018.  Hx is provided by husband and [place].  Mr Fotheringham is absent and has several concerns,he is frustrated.  After left hip surgery she was placed on Aspirin 325 mg daily, her husband wonders if she can go back to Aspirin 81 mg. History of PAD, currently she is on pravastatin 20 mg daily. According to [place], wound is healing and she is recovering well. She is ambulatory now, she has not started PT at home.  According to husband, she does not know how to use a walker, she is walking around the house with her husband assistance.  According to Jackelyn Poling, it was documented on nursing home documentation that she left "AMA", so her health insurance has denied PT at home. According to husband, she received a call asking him to pick Ms. Hukill up to go home, states that she did not leave AMA.  Jackelyn Poling thinks she is safe at home and could start PT  and OT, this afternoon she is going to be evaluated by social service. Her husband also requesting aid at home to help with her care but has not been approved by her health insurance.  Husband is working on Mining [employer].  Wound care 2 times per week for 2 weeks and now once per week. Reporting normal VS (BP,HR,RR,and temp)  Because current public health issue,she is not going to adult day care.  He is concerned because Ms Lucatero is "very weak."  According to husband, she is eating well and has not complained about pain, no MS changes. In 08/2018 she was started on pravastatin 10 mg, which she has tolerated well.  We were also planning on checking LFTs around this time, husband is upset because he does not know when this is going to be done. Reviewing records, she had CMP on 10/04/2018. AST slightly elevated.  Lab Results  Component Value Date   CHOL 235 (H) 08/21/2017   HDL 75.50 08/21/2017   LDLCALC 129 (H) 08/21/2017   TRIG 152.0 (H) 08/21/2017   CHOLHDL 3 08/21/2017   Lab Results  Component Value Date   ALT 41 10/06/2018   AST 42 (H) 10/06/2018   ALKPHOS 78 10/06/2018   BILITOT 0.8 10/06/2018   A few months ago she was started on Q co-10 at 50 mg daily, it was decreased to 10 mg daily in the NH.  Her husband is asking what dose she needs to continue. Husband is not  sure about indication for Q co-10 but she has tolerated medication well.  Observations/Objective: N/A  Assessment and Plan:  Elevated AST (SGOT) Mild. No changes in statin med. We will plan on re-checking LFT in 3-4 months.  Other fatigue We discussed possible etiologies. Explained that there is not unusual to have fatigue and gait difficulties after hospital or SNF discharge. In general she seems to be progressively better. I do not think further work up is needed at this time. Instructed about warning sign.  Unstable gait PT and OT referral has been placed already. Social worker  is visiting today, we will follow recommendations in regard to PT approval. Stressed the importance of fall precautions.  HLD (hyperlipidemia) Continue pravastatin 20 mg daily. We will plan on checking lipid panel in 6 to 12 months.  PAD (peripheral artery disease) (HCC) Explained the radiation for which Aspirin was increased to 325 mg, DVT prevention after hip. She is now ambulatory, so recommend completing 4 to 6 weeks of Aspirin 325 mg and then decrease it to 81 mg. We discussed some side effects of Aspirin. No changes in pravastatin.  Dementia ([place]) According to information provided by husband, problem seems to be stable. I think the best place for her to be is home. No changes in current management Continue following with neurologist.   Follow Up Instructions:  Husband voices understanding and agrees with plan, so is [place]. Pending health insurance approval for home PT/OT. Continue following with orthopedist.  I did not refer this patient for an OV in the next 24 hours for this/these issue(s).  I discussed the assessment and treatment plan with the patient. Husband was provided an opportunity to ask questions and all were answered. Husband appreciated phone call and feels relief, he will let us know if something more is needed.   Return in about 3 months (around 02/09/2019) for f/u 3-4 months.   I provided 30 minutes of non-face-to-face time during this encounter.   Deadrian Toya [name], MD

## 2018-11-10 NOTE — Assessment & Plan Note (Signed)
Explained the radiation for which Aspirin was increased to 325 mg, DVT prevention after hip. She is now ambulatory, so recommend completing 4 to 6 weeks of Aspirin 325 mg and then decrease it to 81 mg. We discussed some side effects of Aspirin. No changes in pravastatin.

## 2018-12-02 DIAGNOSIS — F0390 Unspecified dementia without behavioral disturbance: Secondary | ICD-10-CM | POA: Diagnosis not present

## 2018-12-02 DIAGNOSIS — F329 Major depressive disorder, single episode, unspecified: Secondary | ICD-10-CM | POA: Diagnosis not present

## 2018-12-02 DIAGNOSIS — N183 Chronic kidney disease, stage 3 (moderate): Secondary | ICD-10-CM | POA: Diagnosis not present

## 2018-12-02 DIAGNOSIS — R339 Retention of urine, unspecified: Secondary | ICD-10-CM | POA: Diagnosis not present

## 2018-12-02 DIAGNOSIS — I4891 Unspecified atrial fibrillation: Secondary | ICD-10-CM | POA: Diagnosis not present

## 2018-12-02 DIAGNOSIS — S51811D Laceration without foreign body of right forearm, subsequent encounter: Secondary | ICD-10-CM | POA: Diagnosis not present

## 2018-12-02 DIAGNOSIS — I739 Peripheral vascular disease, unspecified: Secondary | ICD-10-CM | POA: Diagnosis not present

## 2018-12-02 DIAGNOSIS — Z7982 Long term (current) use of aspirin: Secondary | ICD-10-CM | POA: Diagnosis not present

## 2018-12-02 DIAGNOSIS — Z9181 History of falling: Secondary | ICD-10-CM | POA: Diagnosis not present

## 2018-12-02 DIAGNOSIS — D631 Anemia in chronic kidney disease: Secondary | ICD-10-CM | POA: Diagnosis not present

## 2018-12-02 DIAGNOSIS — I129 Hypertensive chronic kidney disease with stage 1 through stage 4 chronic kidney disease, or unspecified chronic kidney disease: Secondary | ICD-10-CM | POA: Diagnosis not present

## 2018-12-02 DIAGNOSIS — Z87440 Personal history of urinary (tract) infections: Secondary | ICD-10-CM | POA: Diagnosis not present

## 2018-12-02 DIAGNOSIS — R1311 Dysphagia, oral phase: Secondary | ICD-10-CM | POA: Diagnosis not present

## 2018-12-02 DIAGNOSIS — J9621 Acute and chronic respiratory failure with hypoxia: Secondary | ICD-10-CM | POA: Diagnosis not present

## 2018-12-02 DIAGNOSIS — S7292XD Unspecified fracture of left femur, subsequent encounter for closed fracture with routine healing: Secondary | ICD-10-CM | POA: Diagnosis not present

## 2018-12-11 ENCOUNTER — Encounter: Payer: Self-pay | Admitting: Family Medicine

## 2018-12-15 DIAGNOSIS — I739 Peripheral vascular disease, unspecified: Secondary | ICD-10-CM | POA: Diagnosis not present

## 2018-12-15 DIAGNOSIS — F329 Major depressive disorder, single episode, unspecified: Secondary | ICD-10-CM | POA: Diagnosis not present

## 2018-12-15 DIAGNOSIS — S7292XD Unspecified fracture of left femur, subsequent encounter for closed fracture with routine healing: Secondary | ICD-10-CM | POA: Diagnosis not present

## 2018-12-15 DIAGNOSIS — N183 Chronic kidney disease, stage 3 (moderate): Secondary | ICD-10-CM | POA: Diagnosis not present

## 2018-12-15 DIAGNOSIS — I4891 Unspecified atrial fibrillation: Secondary | ICD-10-CM | POA: Diagnosis not present

## 2018-12-15 DIAGNOSIS — Z9181 History of falling: Secondary | ICD-10-CM | POA: Diagnosis not present

## 2018-12-15 DIAGNOSIS — R339 Retention of urine, unspecified: Secondary | ICD-10-CM | POA: Diagnosis not present

## 2018-12-15 DIAGNOSIS — D631 Anemia in chronic kidney disease: Secondary | ICD-10-CM | POA: Diagnosis not present

## 2018-12-15 DIAGNOSIS — I129 Hypertensive chronic kidney disease with stage 1 through stage 4 chronic kidney disease, or unspecified chronic kidney disease: Secondary | ICD-10-CM | POA: Diagnosis not present

## 2018-12-15 DIAGNOSIS — Z7982 Long term (current) use of aspirin: Secondary | ICD-10-CM | POA: Diagnosis not present

## 2018-12-15 DIAGNOSIS — Z87440 Personal history of urinary (tract) infections: Secondary | ICD-10-CM | POA: Diagnosis not present

## 2018-12-15 DIAGNOSIS — F0390 Unspecified dementia without behavioral disturbance: Secondary | ICD-10-CM | POA: Diagnosis not present

## 2018-12-15 DIAGNOSIS — S51811D Laceration without foreign body of right forearm, subsequent encounter: Secondary | ICD-10-CM | POA: Diagnosis not present

## 2018-12-15 DIAGNOSIS — J9621 Acute and chronic respiratory failure with hypoxia: Secondary | ICD-10-CM | POA: Diagnosis not present

## 2018-12-15 DIAGNOSIS — R1311 Dysphagia, oral phase: Secondary | ICD-10-CM | POA: Diagnosis not present

## 2018-12-20 ENCOUNTER — Other Ambulatory Visit: Payer: Self-pay | Admitting: Family Medicine

## 2018-12-20 MED ORDER — ZINC OXIDE 16 % EX OINT: 1.0000 "application " | TOPICAL_OINTMENT | CUTANEOUS | 6 refills | Status: DC | PRN

## 2018-12-29 ENCOUNTER — Ambulatory Visit: Payer: Self-pay

## 2018-12-29 NOTE — Telephone Encounter (Signed)
Message sent to Dr. Jordan for review. 

## 2018-12-29 NOTE — Telephone Encounter (Signed)
Pt husband called stating that he found a dime size dark blister to his wife's left heel. She has recently returned home after  left hip fraxture. He wanted information on how to treat it. Pt was instructed to keep pressure off the area as much as possible. Look for signs of infection and notify us if any changes to the area. Pt is ambulating per her husband. He states that he has a foam heel dressing on the area. Pt states that [facility] aid will be there in the morning. Husband was offered an appointment but declined for now.   Answer Assessment - Initial Assessment Questions 1. REASON FOR CALL or QUESTION: "What is your reason for calling today?" or "How can I best help you?" or "What question do you have that I can help answer?"     How do I treat a pressure ulcer on left heel.  Protocols used: INFORMATION ONLY CALL-A-AH

## 2018-12-30 DIAGNOSIS — S51811D Laceration without foreign body of right forearm, subsequent encounter: Secondary | ICD-10-CM | POA: Diagnosis not present

## 2018-12-30 DIAGNOSIS — I4891 Unspecified atrial fibrillation: Secondary | ICD-10-CM | POA: Diagnosis not present

## 2018-12-30 DIAGNOSIS — R1311 Dysphagia, oral phase: Secondary | ICD-10-CM | POA: Diagnosis not present

## 2018-12-30 DIAGNOSIS — Z7982 Long term (current) use of aspirin: Secondary | ICD-10-CM | POA: Diagnosis not present

## 2018-12-30 DIAGNOSIS — Z9181 History of falling: Secondary | ICD-10-CM | POA: Diagnosis not present

## 2018-12-30 DIAGNOSIS — N183 Chronic kidney disease, stage 3 (moderate): Secondary | ICD-10-CM | POA: Diagnosis not present

## 2018-12-30 DIAGNOSIS — S7292XD Unspecified fracture of left femur, subsequent encounter for closed fracture with routine healing: Secondary | ICD-10-CM | POA: Diagnosis not present

## 2018-12-30 DIAGNOSIS — Z87440 Personal history of urinary (tract) infections: Secondary | ICD-10-CM | POA: Diagnosis not present

## 2018-12-30 DIAGNOSIS — J9621 Acute and chronic respiratory failure with hypoxia: Secondary | ICD-10-CM | POA: Diagnosis not present

## 2018-12-30 DIAGNOSIS — F329 Major depressive disorder, single episode, unspecified: Secondary | ICD-10-CM | POA: Diagnosis not present

## 2018-12-30 DIAGNOSIS — D631 Anemia in chronic kidney disease: Secondary | ICD-10-CM | POA: Diagnosis not present

## 2018-12-30 DIAGNOSIS — F0390 Unspecified dementia without behavioral disturbance: Secondary | ICD-10-CM | POA: Diagnosis not present

## 2018-12-30 DIAGNOSIS — I129 Hypertensive chronic kidney disease with stage 1 through stage 4 chronic kidney disease, or unspecified chronic kidney disease: Secondary | ICD-10-CM | POA: Diagnosis not present

## 2018-12-30 DIAGNOSIS — I739 Peripheral vascular disease, unspecified: Secondary | ICD-10-CM | POA: Diagnosis not present

## 2018-12-30 DIAGNOSIS — R339 Retention of urine, unspecified: Secondary | ICD-10-CM | POA: Diagnosis not present

## 2018-12-31 NOTE — Telephone Encounter (Signed)
It could be a decubitus ulcer. Keep area clean with soap and water,avoid pressure. Please have Kathryn [name] to evaluate lesion.  Thanks, BJ

## 2018-12-31 NOTE — Telephone Encounter (Signed)
Kathryn [name] given recommendations per Dr. [name] and verbalized understanding.Spoke with Kathryn [name] and he stated that [facility] came out yesterday and said the exact that Dr. [name] had said. He state that it has started to look better since he first noticed it.

## 2019-01-06 DIAGNOSIS — S72009S Fracture of unspecified part of neck of unspecified femur, sequela: Secondary | ICD-10-CM | POA: Diagnosis not present

## 2019-01-06 DIAGNOSIS — S72009 Fracture of unspecified part of neck of unspecified femur: Secondary | ICD-10-CM | POA: Diagnosis not present

## 2019-01-07 ENCOUNTER — Telehealth: Payer: Self-pay | Admitting: Family Medicine

## 2019-01-07 NOTE — Telephone Encounter (Signed)
Can we arrange a virtual visit,so we can discuss palliative care or other options.I do not want to place a referral without first  having discussion with her husband. Thanks, BJ

## 2019-01-07 NOTE — Telephone Encounter (Signed)
Copied from [place] [phone]. Topic: Quick Communication - See Telephone Encounter >> Jan 07, 2019 12:34 PM Blase Mess A wrote: CRM for notification. See Telephone encounter for: 01/07/19.  Duwayne Heck calling from [employer] to home to complete Dexa-scan calling to report the patient has advanced demetia. Recommend a palliative care referral. The patient has a pressure ulcer on her left heell. Please advise CB- [phone]

## 2019-01-07 NOTE — Telephone Encounter (Signed)
Message sent to Dr. Jordan for review and approval. 

## 2019-01-10 NOTE — Telephone Encounter (Signed)
Mr. Mccaffrey informed and scheduled virtual visit for 01/11/2019.

## 2019-01-11 ENCOUNTER — Ambulatory Visit (INDEPENDENT_AMBULATORY_CARE_PROVIDER_SITE_OTHER): Payer: PPO | Admitting: Family Medicine

## 2019-01-11 DIAGNOSIS — N183 Chronic kidney disease, stage 3 unspecified: Secondary | ICD-10-CM

## 2019-01-11 DIAGNOSIS — F0280 Dementia in other diseases classified elsewhere without behavioral disturbance: Secondary | ICD-10-CM | POA: Diagnosis not present

## 2019-01-11 DIAGNOSIS — R5381 Other malaise: Secondary | ICD-10-CM | POA: Diagnosis not present

## 2019-01-11 DIAGNOSIS — G3109 Other frontotemporal dementia: Secondary | ICD-10-CM

## 2019-01-11 DIAGNOSIS — R2681 Unsteadiness on feet: Secondary | ICD-10-CM | POA: Diagnosis not present

## 2019-01-11 DIAGNOSIS — F411 Generalized anxiety disorder: Secondary | ICD-10-CM

## 2019-01-11 DIAGNOSIS — L89622 Pressure ulcer of left heel, stage 2: Secondary | ICD-10-CM | POA: Diagnosis not present

## 2019-01-11 NOTE — Progress Notes (Signed)
Virtual Visit via Telephone Note  I connected with Mylani Gentry Bogart's husband on 01/12/19 at 10:30 AM EDT by telephone and verified that I am speaking with the correct person using two identifiers.   I discussed the limitations, risks, security and privacy concerns of performing an evaluation and management service by telephone and the availability of in person appointments. I also discussed with the patient that there may be a patient responsible charge related to this service. He expressed understanding and agreed to proceed.  Location patient: home Location provider: home office Participants present for the call: patient, provider Patient did not have a visit in the prior 7 days to address this/these issue(s).   History of Present Illness: Kathryn [name] is a 85 yo female with Hx of dementia,anxiety,CKD,HLD,and PAD,whose state has progressively declining.  S/P left hip hemiarthroplasty on 09/29/18, hip fracture after a fall at home. She was discharged to a SNF and back home 10/26/18. Recently a blister with clear fluid on left heel. According to husband,she has already been evaluated,wearing a cast to prevent friction. He has not noted local edema,erythema,or drainage.   Her husband is not able to take care of Kathryn Fassnacht. She needs helps with ADL's at home, he would like to have care at home. He is considering palliative care, requesting referral, he does not want her back to a NH. She has had  Some trouble sleeping, she was receiving medication while in the nursing home that caused confusion,so husband does not want sleep aid.Problem seems to be better. + Fatigue,sleeps most of the time.  She is on exelon 6 mg bid.  HLD and PAD,she is taking Pravastatin 10 mg and Aspirin 81 mg daily. Tolerating medication well.  She fell again Monday am,no seriously injured.  She has had dental issues, has appt with dentist.  Anxiety, she is on Lexapro 10 mg daily. Negative for depressed  mood.  Observations/Objective: Patient reported vitals:N/A  Assessment and Plan:  1. Declining functional status Referral to palliative care placed. - Ambulatory referral to Home Health  2. Other frontotemporal dementia without behavioral disturbance ([place]) Progressively getting worse. For now her husband would like to continue [place] for now. She follows with neurologist.  - Ambulatory referral to [place]  3. Pressure injury of left heel, stage 2 ([place]) Continue wound care, prevent friction on affected and monitor for signs of infection. :Prevention of new lesions with changing positions periodically.  - Ambulatory referral to [place]  4. CKD (chronic kidney disease), stage III (HCC) Adequate hydration, continue avoiding use of NSAID's.  5. Unstable gait Fall prevention discussed. Still having falls,not seriously injure according to pt.  6. Generalized anxiety disorder Stable with Lexapro 10 mg daily. No changes in current management.   Follow Up Instructions: Eventually we will need to consider discontinuing some of her meds, statin and Aspirin. I am not sure how much she is benefiting from [facility] at this stage of disease, husband needs to have discussion with neuro.   I did not refer this patient for an OV in the next 24 hours for this/these issue(s).  I discussed the assessment and treatment plan with the patient. Husband was provided an opportunity to ask questions and all were answered. The patient agreed with the plan and demonstrated an understanding of the instructions.    I provided 21 minutes of non-face-to-face time during this encounter.   Anderson Coppock [name], MD

## 2019-01-12 ENCOUNTER — Telehealth: Payer: Self-pay | Admitting: Family Medicine

## 2019-01-12 ENCOUNTER — Encounter: Payer: Self-pay | Admitting: Family Medicine

## 2019-01-12 NOTE — Telephone Encounter (Signed)
Copied from [place] [id number]. Topic: Quick Communication - See Telephone Encounter >> Jan 12, 2019 12:30 PM Burchel, Abbi R wrote: CRM for notification. See Telephone encounter for: 01/12/19.  Kathryn [name] ([facility]-[phone]) rec'd referral for palliative care wants to confirm that referral needs to be with them not with [facility] ([place]).

## 2019-01-14 ENCOUNTER — Telehealth: Payer: Self-pay | Admitting: Internal Medicine

## 2019-01-14 NOTE — Telephone Encounter (Signed)
Spoke with patient's husband [name] and have scheduled an in-person Palliative Consult for 01/18/19 @ 1:30 PM.

## 2019-01-18 ENCOUNTER — Other Ambulatory Visit: Payer: PPO | Admitting: Internal Medicine

## 2019-01-18 ENCOUNTER — Encounter: Payer: Self-pay | Admitting: Internal Medicine

## 2019-01-18 ENCOUNTER — Other Ambulatory Visit: Payer: Self-pay

## 2019-01-18 DIAGNOSIS — Z515 Encounter for palliative care: Secondary | ICD-10-CM

## 2019-01-18 NOTE — Progress Notes (Signed)
June 16th, 2020 [facility] Palliative Care Consult Note Telephone: [phone]  Fax: [phone]  PATIENT NAME: Kathryn [name] DOB: 1933/12/23 MRN: [id number]  PRIMARY CARE PROVIDER:   [name], [name], MD  REFERRING PROVIDER:  [name], [name], [place],  [place] [address]  RESPONSIBLE PARTY: (Spouse) Marwah Disbro [phone], [phone].  (son) Jerusalem Brownstein [phone], [phone]   IMPRESSION/RECOMENTATIONS: 1.Cognitive / Functional decline: FAST 7a. Spouse noted gradual progressive cognitive decline for approximatly 5 years. Currently patient needs much direction, and cueing (verbal and hands on) to follow simple tasks. Only rarely resistant to directions. She no longer recognizes her husband or home. Her speech is off topic; she is unable to hold a coherent conversation. She holds imaginary conversations with family members who have passed. She is napping more during the day, and is sleeping through the night. Spouse estimates she is awake about 8 hours/d. She snores loudly during the night. She is ambulatory; spouse notes she seems weaker with standing, and now occasionally needs assist to stand. She is unable to cognate how to use assistive devices such as a walker or cane. No recent falls. She is unable to manipulate doors or locks so not a wandering risk. Doesn't tend to wander about the house. Spouse [name] has effectively blocked off the stairs.  Bedroom is on the first floor. Patient has a L heel un-stagable pressure injury (thick scab) without surrounding inflammation; spouse reports improving. Non-draining; protected with a light foam padding. Patient consumes 100% of meals; able to manipulate eating utensils. Her weight has been stable for the last year or so. Patient's Alb (10/06/2018) was 2.4.  She has difficulty picking up large cups d/t arthritic hands. She is incontinent of bowel and bladder, and  no longer understands how to use the toilet.  2. Family Supports:  Spouse is the primary caregiver; he is feeling very socially isolated d/t COVID-19 crises. "We're like 2 rats on a desert island!". Spouse is hoping that when the COVID restrictions ease up, he can bring patient back to adult day care on [address] as he felt she did a little better with more socialization.  Patient has 2 sons and a daughter from a prior marriage, who are not involved in patient's care. Spouse has 3 children from prior marriage who frequently call; closest child is son Remo Lipps who lives near [place] and works in [place]. They stay in weekly contact. Though spouse admits feeling stressed with day to day care, he states he takes it in stride. He saw his dad take care of his mom as she declined, and feels this is just part of his responsibility as a husband. Spouse has already completed funeral plans. He has attended many educational meetings on dementia, and has a good handle on the expected progression of dementia, and what future cognitive and functional decline might look like.   3. Advance Care Directives: I reviewed in some detail the individual sections of the MOST form. Companion patient education material was reviewed and left with spouse. He plans to review with his son Richardson Landry, with whom he shares HCPOA/FPOA .  4. Goals of Care:  -Keep patient home with additional supports as they are needed. -Wishes to establish with [facility] who would make house calls. I gave his the names and numbers of 3 providers he could consider. -Open to medication simplification if future difficulty with patient taking pills.  5. Follow up Palliative Care Visit: Patient wishes to contact me in one month or so; he mentions he will reach out via e-mail   HISTORY OF PRESENT ILLNESS:  Kathryn [name] is a 85 yo female with Hx of frontotemporal dementia (without behavioral disturbances), anxiety, CKD (stage 3; Creatine 10/06/2018: 1.09); GFR 47), HLD, and  PAD, whose state has progressively declining. L hip repair (09/29/2018) after fall. Subsequent SNF [facility]) then returned to home 10/27/2018.[facility] was asked to help address goals of care.   CODE STATUS: Full  PPS: weak 40% HOSPICE ELIGIBILITY/DIAGNOSIS: TBD  PAST MEDICAL HISTORY:  Past Medical History:  Diagnosis Date   Alzheimer's dementia ([place])    Anxiety    Headache    Hypertension     SOCIAL HX:  Social History   Tobacco Use   Smoking status: Former Smoker    Quit date: 1980    Years since quitting: 40.4   Smokeless tobacco: Never Used  Substance Use Topics   Alcohol use: Yes    Alcohol/week: 7.0 - 14.0 standard drinks    Types: 7 - 14 Glasses of wine per week    Comment: white wine    ALLERGIES:  Allergies  Allergen Reactions   Aricept [Donepezil Hcl] Other (See Comments)    "giving stomach and digestive problems"   Sulfonamide Derivatives     REACTION: Swollen joints     PERTINENT MEDICATIONS:  Outpatient Encounter Medications as of 01/18/2019  Medication Sig   aspirin EC 325 MG EC tablet Take 1 tablet (325 mg total) by mouth daily with breakfast.   Cholecalciferol (VITAMIN D3) 2000 UNITS TABS Take 3,000 Units by mouth daily.    Coenzyme Q10 (COQ10 PO) Take by mouth.   escitalopram (LEXAPRO) 10 MG tablet Take 10 mg by mouth daily.   folic acid (FOLVITE) 161 MCG tablet Take 400 mcg by mouth daily.   NON FORMULARY MK-7 90 meq   polyethylene glycol (MIRALAX / GLYCOLAX) packet Take 17 g by mouth daily as needed for mild constipation.   pravastatin (PRAVACHOL) 10 MG tablet Take 1 tablet (10 mg total) by mouth daily.   pyridoxine (B-6) 100 MG tablet Take 100 mg by mouth daily.   rivastigmine (EXELON) 6 MG capsule TAKE 1 CAPSULE (6 MG TOTAL) 2 (TWO) TIMES DAILY BY MOUTH   senna-docusate (SENOKOT-S) 8.6-50 MG tablet Take 1 tablet by mouth at bedtime as needed for mild constipation.   zinc gluconate 50 MG tablet Take 50 mg by mouth  daily.   Zinc Oxide (BOUDREAUXS BUTT PASTE) 16 % OINT Apply 1 application topically as needed.   No facility-administered encounter medications on file as of 01/18/2019.     PHYSICAL EXAM:  Limited d/t  COVID -19 social distancing  General: NAD, frail appearing, thin, Transiently maintaining eye contact; rambling off topic speech. Extremities: no edema, no joint deformities Skin: no rashes Neurological: Overall weakness  Julianne Handler, NP

## 2019-01-20 ENCOUNTER — Telehealth: Payer: Self-pay | Admitting: *Deleted

## 2019-01-20 NOTE — Telephone Encounter (Signed)
Called pt's husband (on [place]) and LVM (ok per DPR) asking for call back to update chart for telephone visit on Monday 01/24/2019 @ 2;00 pm. Left office number in message.

## 2019-01-24 ENCOUNTER — Other Ambulatory Visit: Payer: Self-pay

## 2019-01-24 ENCOUNTER — Encounter: Payer: Self-pay | Admitting: *Deleted

## 2019-01-24 ENCOUNTER — Ambulatory Visit (INDEPENDENT_AMBULATORY_CARE_PROVIDER_SITE_OTHER): Payer: PPO | Admitting: Neurology

## 2019-01-24 DIAGNOSIS — F0280 Dementia in other diseases classified elsewhere without behavioral disturbance: Secondary | ICD-10-CM | POA: Diagnosis not present

## 2019-01-24 DIAGNOSIS — G3109 Other frontotemporal dementia: Secondary | ICD-10-CM | POA: Diagnosis not present

## 2019-01-24 NOTE — Progress Notes (Signed)
[facility]    Provider:  Dr [name] Referring Provider: [name], [name], MD Primary Care Physician:  [name], [name], MD  CC: Dementia  Virtual Visit via Telephone Note  I connected with Kathryn [name] on 01/24/19 at  2:00 PM EDT by telephone and verified that I am speaking with the correct person using two identifiers.  Location: Patient: office Provider: home   I discussed the limitations, risks, security and privacy concerns of performing an evaluation and management service by telephone and the availability of in person appointments. I also discussed with the patient that there may be a patient responsible charge related to this service. The patient expressed understanding and agreed to proceed.   Follow Up Instructions:    I discussed the assessment and treatment plan with the patient. The patient was provided an opportunity to ask questions and all were answered. The patient agreed with the plan and demonstrated an understanding of the instructions.   The patient was advised to call back or seek an in-person evaluation if the symptoms worsen or if the condition fails to improve as anticipated.  I provided 22 minutes of non-face-to-face time during this encounter.   Melvenia Beam, MD    HPI: This is a very lovely 85year old female with memory loss. MoCA 14/30, MMSE 14/30. Likely Alzheimer's type dementia. MRi of the brain showed moderate generalized cortical atrophy that is most pronounced in the mesial temporal lobes and several T2/FLAIR hyperintense foci in the deep and subcortical white matter consistent with chronic microvascular ischemic changes.  Interval history: Cognitive decline since hip fracture, hospitalization and SNF. She jerks at night, when he holds her hand he feels her jerking may be hypnic jerks wake her if she is doing it and make sure she is awake and alert and not having seizures, may be a dream, keep following with pcp.    Interval history 07/22/2018: She was in bed. She was waking up, husband thinks she was trying to put her slippers on. She hit the floor. She has never fallen before. She still has injury aorund her eye, healing ecchymoses. No other significant falls, she had a restless night no sleeping, agitated up and down. Husband has been watching her fluif intake thinks maybe she was dehydrated. She naps and twitches. She badly bruised her hand. She is feeling better.  They have had a home inspection this year for safety. No vision loss, no headaches.    Interval history 06/15/2018: Patient here with husband for follow up. She says she is going "great" and is pleasant. She has improved with speech with speech therapy and is going to [facility] twice a week for adult daycenter. They see her pcp and had difficulty with stomach issue which resolved.  Feels like she gained weight. She is not eating more. She is getting less exercise. Could be a medication, hard to know without stopping. Her mood is fine, will discontinue the Lexapro  Interval history 04/02/2017: Patient is here with husband for follow-up. Husband is asking my opinion on whether patient has capacity to make decisions regarding estate planning. She has difficulty due to dementia expressing but she has a basic understanding when trying to explain POA and beneficiaries - But I do think she understands when asked and explained she grasps the concepts and is able to agree or decline. If you ask her, she is able to express her wishes for inheritance, POA and other legal issues. She is very clear on her  thoughts on inheritance and family members she would like to be included and how her estate should be dispersed. She also is also in agreement with her husband and has no reservations about his decision making and his concern for her well-being.  Interval history 09/04/2016: Patient continues to have headaches. CT of the head was negative for acute intracranial  abnormality. She has daily headaches. She was taking up to 7 tylenol daily and I advised him to stop as this can cause significant side effects including rebound headaches. No falls or head injury. She has a difficult time describing the headaches due to dementia. We stopped Aricept to see if this would help but it has not made any changes. They were using the tylenol and they stopped the last 2 days. The last 2 days she has not taken tylenol and she has not mentioned the headache. No double vision or neck pain. CRP and sed rate were normal. I have advised no OTC medications more than 2-3x a week to avoid rebound headaches. No previous history of migraines. Headache is in the temple areas. No vision changes or other signs of temporal arteritis. They are going to see the eye doctor. She also stopped all supplements and they are slowly adding them back. I advised we don;t exactly know what is in all these supplements and I advise them to stop if there are headaches. She has been better these last 2 days and I advised if it continues/worsens we can order an MRI brain. Headaches are with activity. A home nurse took blood pressure about 140. She has fallen twice she sits in a chair and she got up and turned too fast and fell on the floor. We could also try low dose neurontin. I recommended PT in the home for gait and safety and a home safety study and they declined, I advised safety precautions for fall risks. Husband thinks stress may be causing the headache due to her brother's health problems and recent illness, her son has also been in the hospital twice in the last 3 months for alcoholism. Husband takes notes today.   Interval history: 03/03/2016: Here for follow up of alzheimer's dementia. Repeat moCA 12/30 (last 14/30). She is on Aricept, did not tolerate the Namenda. B12 normal. Last TSH WNL. Here with husband. She does chair yoga. Her blood pressures at home have been normal avg 133/70, elevation in the  officelikely from anxiety. She is sleeping well. There is some stress with patient's daughter which is better. They have sons with substance abuse and this has improved as well. She still sees her high school friends from [place] her HS at [employer] 4x a year, no behavioral prolems, she is lovely.   Interval history 09/04/2015: She is feeling better. She is having more bowel movements with the namenda. No diarrhea. It is not causing her discomfort, she is not dehydrated, she doesn't drink a lot of coffee, she has a great diet full of fiber. Anxiety level is better. She has projects to do at home, she is remaining active daily. She is here with her husband.   Reviewed MRi of the brain: IMPRESSION: This is an abnormal MRI of the brain without contrast showed the following: 1. Moderate generalized cortical atrophy that is most pronounced in the mesial temporal lobes. 2. Several T2/FLAIR hyperintense foci in the deep and subcortical white matter consistent with chronic microvascular ischemic changes. The extent is typical for age.  B12 and folate normal.   HPI: Kathryn  Viona Gilmore [name] is a 85 y.o. female here as a referral from Dr. Justin Mend for memory loss. PMHx anxiety, HTN, fatige, headache. She lives with her husband in a single-family home. Husband is here with her. She functions well at home per patient, she cooks and likes to do yard work. She watches the news dails and enjoys watching movies. Patient says she forgets things at the store. Husband noticed the memory changes which started last October/November when they were dealing with bed bugs. Husband says directions were difficult for patient, he had to watch patient closely to make sure she followed directions. Previous to this the changes were subtle, she would forget little things. There has been a decline in memory since then. Slowly progressive. She cooks, cleans, she drives. One time the stove was left on but otherwise no accidents in the home  or in the car. She doesn't drive far, she doesn't get lost. She knows where to go around her house. She can make dinner fine. One time she forgot to add an ingredient in dinner but that was just an oversight. She is sleeping well. She writes down appointments and keep appointment cards. She has forgotten one appointment a long time ago. Husband does the finances, patient used to do the finances but husband took over because patient couldn't keep up with it. She was forgetting to pay bills. No behavioral changes except sometimes she gets aggravated at herself. No hallucinations or delusions. Mother with Alzheimers at about age 50. Husband is becoming concerned with wife's driving. She goes to yoga every week. No other focal neurologic symptoms. Older memories are intact. More recent memories impaired.   Reviewed notes, labs and imaging from outside physicians, which showed: patient was referred for evaluation of alzheimer's dementia. CMP unremarkable. TSH wnl.   Review of Systems: Patient complains of symptoms per HPI as well as the following symptoms: No fever, no chills, no CP, no sob. Pertinent negatives per HPI. All others negative.   Review of Systems: Patient complains of symptoms per HPI as well as the following symptoms: no agitation. Pertinent negatives and positives per HPI. All others negative.   Social History   Socioeconomic History   Marital status: Married    Spouse name: [name]    Number of children: 3   Years of education: 12   Highest education level: Not on file  Occupational History   Not on file  Social Needs   Financial resource strain: Not on file   Food insecurity    Worry: Not on file    Inability: Not on file   Transportation needs    Medical: Not on file    Non-medical: Not on file  Tobacco Use   Smoking status: Former Smoker    Quit date: 1980    Years since quitting: 40.5   Smokeless tobacco: Never Used  Substance and Sexual Activity    Alcohol use: Not Currently    Alcohol/week: 7.0 - 14.0 standard drinks    Types: 7 - 14 Glasses of wine per week    Comment: white wine; update 01/24/19 pt quit drinking alcohol completely   Drug use: No   Sexual activity: Not on file  Lifestyle   Physical activity    Days per week: Not on file    Minutes per session: Not on file   Stress: Not on file  Relationships   Social connections    Talks on phone: Not on file    Gets together: Not on file  Attends religious service: Not on file    Active member of club or organization: Not on file    Attends meetings of clubs or organizations: Not on file    Relationship status: Not on file   Intimate partner violence    Fear of current or ex partner: Not on file    Emotionally abused: Not on file    Physically abused: Not on file    Forced sexual activity: Not on file  Other Topics Concern   Not on file  Social History Narrative   Lives at home with husband   Caffeine use: Stopped drinking coffee, drinks maybe 8 oz of unsweet tea daily   Right handed   Goes to [facility] for 4 hours twice weekly (update has not been there since 09/28/2018 when she broke her hip)    Family History  Problem Relation Age of Onset   Thyroid disease Mother    Alzheimer's disease Mother    Dementia Mother    Hearing loss Brother    Hearing loss Brother        also developing memory problems   Heart Problems Brother     Past Medical History:  Diagnosis Date   Alzheimer's dementia ([place])    Anxiety    Headache    Hypertension     Past Surgical History:  Procedure Laterality Date   BREAST BIOPSY     HIP ARTHROPLASTY Left 09/29/2018   Procedure: left hip (HEMIARTHROPLASTY);  Surgeon: Marybelle Killings, MD;  Location: [facility];  Service: Orthopedics;  Laterality: Left;    Current Outpatient Medications  Medication Sig Dispense Refill   ASPIRIN 81 PO Take 81 mg by mouth daily.     Cholecalciferol (VITAMIN D3)  2000 UNITS TABS Take 2,000 Units by mouth daily.      Coenzyme Q10 (COQ10 PO) Take 50 mg by mouth daily.      escitalopram (LEXAPRO) 10 MG tablet Take 10 mg by mouth daily.     folic acid (FOLVITE) 409 MCG tablet Take 400 mcg by mouth daily.     polyethylene glycol (MIRALAX / GLYCOLAX) packet Take 17 g by mouth daily as needed for mild constipation. 14 each 0   pravastatin (PRAVACHOL) 10 MG tablet Take 1 tablet (10 mg total) by mouth daily. 90 tablet 1   pyridoxine (B-6) 100 MG tablet Take 100 mg by mouth daily.     rivastigmine (EXELON) 6 MG capsule TAKE 1 CAPSULE (6 MG TOTAL) 2 (TWO) TIMES DAILY BY MOUTH 60 capsule 11   senna-docusate (SENOKOT-S) 8.6-50 MG tablet Take 1 tablet by mouth at bedtime as needed for mild constipation. (Patient not taking: Reported on 01/24/2019)     zinc gluconate 50 MG tablet Take 50 mg by mouth daily.     Zinc Oxide (BOUDREAUXS BUTT PASTE) 16 % OINT Apply 1 application topically as needed. 113 g 6   No current facility-administered medications for this visit.     Allergies as of 01/24/2019 - Review Complete 01/24/2019  Allergen Reaction Noted   Aricept [donepezil hcl] Other (See Comments) 09/04/2016   Sulfonamide derivatives  09/23/2010    Vitals: There were no vitals taken for this visit. Last Weight:  Wt Readings from Last 1 Encounters:  10/06/18 202 lb (91.6 kg)   Last Height:   Ht Readings from Last 1 Encounters:  09/28/18 5\' 2"  (1.575 m)         Cranial Nerves:  The pupils are equal, round,  and reactive to light. Visual fields are full to finger confrontation. Extraocular movements are intact. Trigeminal sensation is intact and the muscles of mastication are normal. The face is symmetric. The palate elevates in the midline. Hearing intact. Voice is normal. Shoulder shrug is normal. The tongue has normal motion without fasciculations.   Motor Observation:  No asymmetry, no atrophy, and no involuntary movements  noted. Tone:  Normal muscle tone.   Posture:  Posture is normal. normal erect   Strength:  Strength is V/V in the upper and lower limbs.    Sensation: intact to LT   Reflex Exam:  DTR's:  Deep tendon reflexes in the upper and lower extremities are normal bilaterally.  Toes: left upgoing Clonus:  Clonus is absent.   MMSE - Mini Mental State Exam 09/14/2017 12/01/2016  Orientation to time 1 1  Orientation to Place 2 3  Registration 3 3  Attention/ Calculation 0 0  Recall 0 0  Language- name 2 objects 1 2  Language- repeat 0 1  Language- follow 3 step command 3 3  Language- read & follow direction 1 1  Write a sentence 0 0  Copy design 0 0  Total score 11 14     Assessment/Plan: This is a very lovely 85year old female with memory loss. MoCA 14/30, MMSE 14/30. Likely Alzheimer's type dementia. MRi of the brain showed moderate generalized cortical atrophy that is most pronounced in the mesial temporal lobes and several T2/FLAIR hyperintense foci in the deep and subcortical white matter consistent with chronic microvascular ischemic changes. The extent is typical for age. B12, folate and TSH WNL. She has several months of headaches in the setting of stress, CT of the head negative.  Progressive decline, step wise after fall and breaking hi[ Continue Exeleon (did not tolerate Aricept) Did not tolerate the Namenda 10mg  twice daily.  No Driving Stopped Lexapro, anxiety improved Headache improved - gabapentin as needed Followup with pcp or in neurology as needed Aphasia improved with therapy and adult daycenter HLD: Recommend a daily baby asa for stroke prevention. Also LDL is elevated, discuss with pcp about a statin for goal < 70. Stroke prevention: daily asa Fall risk, discussed, recent mechanical fall, they decline PT, already had house safety evauated  Prior appointment: - Patient is here with husband for follow-up. Husband is asking my  opinion on whether patient has capacity to make decisions regarding estate planning. She has difficulty due to dementia but she has a basic understanding when trying to explain POA and beneficiaries, I do think she  she grasps the concepts and is able to agree or decline with state planning decisions. She is able to express her wishes for inheritance, POA and other legal issues. She is very clear on her thoughts on inheritance and family members she would like to be included and how her estate should be dispersed. She also is also in agreement with her husband and has no reservations about his decision making and his concern for her well-being.   Discussed dementia, progressive disorder, RTC as needed  Sarina Ill, MD  [facility] [address] [place], [place] [address]  Phone [phone] Fax [phone]  A total of 25 minutes was spent face-to-face with this patient. Over half this time was spent on counseling patient on the  No diagnosis found.  diagnosis and different diagnostic and therapeutic options available.

## 2019-01-24 NOTE — Telephone Encounter (Signed)
Please advise where you want this referral to be sent to see note below . From   [facility] ([phone]) rec'd referral for palliative care wants to confirm that referral needs to be with them not with [facility] ([place]).

## 2019-01-24 NOTE — Addendum Note (Signed)
Addended by: Gildardo Griffes on: 01/24/2019 09:52 AM   Modules accepted: Orders

## 2019-01-24 NOTE — Telephone Encounter (Signed)
I did receive a message a few days after referral asking me about this, I agree with having another agency as well as service can be provided.I think evaluation was already done. Thanks, BJ

## 2019-01-24 NOTE — Telephone Encounter (Signed)
Pt's husband [name] (on [place]) called back, confirmed pt's name & DOB and provided updates to chart. He stated pt has home heath aid for 4 hours x 6 days per week through [employer]. Not complaining much of any pain in quite awhile. She is beginning to sleep good.

## 2019-02-18 ENCOUNTER — Telehealth: Payer: Self-pay | Admitting: *Deleted

## 2019-02-18 ENCOUNTER — Telehealth: Payer: Self-pay | Admitting: Family Medicine

## 2019-02-18 NOTE — Telephone Encounter (Signed)
LM for Mr. Callins to return call to office concerning paperwork.  Copied from [place] [phone]. Topic: General - Inquiry >> Feb 17, 2019  1:41 PM Berneta Levins wrote: Reason for CRM:  Pt's spouse is trying to get pt admitted into long term care facility but has paperwork that Dr. [name] needs to fill out.  [name] wants to know if he can just drop paperwork off at office.  Per [facility] send CRM so Dr. [name] can advise/

## 2019-02-18 NOTE — Telephone Encounter (Signed)
Pt husband stated they received a call earlier and he was just calling back.

## 2019-02-18 NOTE — Telephone Encounter (Signed)
General/Other - call back  The patient called back because someone called to speak about paperwork. Please call the patient back

## 2019-02-21 ENCOUNTER — Telehealth: Payer: Self-pay | Admitting: Family Medicine

## 2019-02-21 NOTE — Telephone Encounter (Signed)
Spoke with Mr. Kathryn [name] and he stated that he did not say anything about putting his wife in a long term care facility, he told them that he needed long term care insurance paperwork filled out for the insurance company. Mr. Kathryn [name] stated that he came by the office to drop off release of information and paperwork last week, but was unable to get in office and he did not have his cell phone to call the number on the sign, he will drop off forms on Wednesday.  Mr. Kathryn [name] stated that insurance company also did a bone density scan at the beginning of July and was supposed to send info to Dr. [name], they were waiting on results. Patient informed that I would check provider's desk and see if it was sent over.

## 2019-02-21 NOTE — Telephone Encounter (Signed)
Returned call to Kathryn [name], lm to return call to clinic.

## 2019-02-21 NOTE — Telephone Encounter (Signed)
Levada Dy states husband is also inquiring about results of bone density scan. Please notify pt's husband when available.

## 2019-02-21 NOTE — Telephone Encounter (Signed)
Levada Dy, RN with [facility], calling on behalf on patient. Levada Dy states that patient's husband has informed her that patient has had multiple falls recently and inquired about [facility] services.   Levada Dy is requesting a call back to discuss further, if needed.

## 2019-02-23 ENCOUNTER — Telehealth: Payer: Self-pay | Admitting: Family Medicine

## 2019-02-23 NOTE — Telephone Encounter (Signed)
Patient husband came in and dropped off teo forms that needs to be filled out. The first form is for ling term disability and the other form is for a health screening. The forms will be in the doctors folder at the front

## 2019-02-23 NOTE — Telephone Encounter (Signed)
Called [facility] and left message for Manuela Schwartz to fax bone density results to office. Patient's husband was inquiring about results and recommendations per Dr. [name].

## 2019-02-25 NOTE — Telephone Encounter (Signed)
Forms placed on providers desk for completion.  

## 2019-03-03 ENCOUNTER — Telehealth: Payer: Self-pay

## 2019-03-03 ENCOUNTER — Other Ambulatory Visit: Payer: Self-pay

## 2019-03-03 NOTE — Telephone Encounter (Signed)
Spoke with Kathryn [name], she states pt's husband is requesting long-term IN HOME care and not facility care. Their insurance covers this. He wants to make sure this is understood and what is being requested.

## 2019-03-03 NOTE — Telephone Encounter (Signed)
Copied from [place] [id number]. Topic: General - Other >> Mar 03, 2019  3:42 PM Rainey Pines A wrote: Army Melia RN called to inform provider that patient stated that he wants long term home health but wants clarification on the paperwork that he turned in is what he is actually getting. Patient wants long term home health. Levada Dy [phone]

## 2019-03-04 DIAGNOSIS — Z0279 Encounter for issue of other medical certificate: Secondary | ICD-10-CM

## 2019-03-08 NOTE — Telephone Encounter (Signed)
I already filled out forms a few days ago. If needed I can make changes. Thanks, BJ

## 2019-03-11 NOTE — Telephone Encounter (Signed)
Forms already completed by Dr. [name].Patient has appointment with Dr. [name] on Tuesday will discuss then. Nothing further needed at this time.

## 2019-03-11 NOTE — Telephone Encounter (Signed)
Noted  

## 2019-03-11 NOTE — Telephone Encounter (Signed)
Patient has appointment with Dr. [name] on Tuesday will discuss then. Nothing further needed at this time.

## 2019-03-15 ENCOUNTER — Encounter: Payer: Self-pay | Admitting: Family Medicine

## 2019-03-15 ENCOUNTER — Ambulatory Visit (INDEPENDENT_AMBULATORY_CARE_PROVIDER_SITE_OTHER): Payer: PPO | Admitting: Family Medicine

## 2019-03-15 ENCOUNTER — Other Ambulatory Visit: Payer: Self-pay

## 2019-03-15 VITALS — BP 120/70 | HR 75 | Temp 97.9°F | Resp 12 | Ht 62.0 in | Wt 123.0 lb

## 2019-03-15 DIAGNOSIS — Z23 Encounter for immunization: Secondary | ICD-10-CM

## 2019-03-15 DIAGNOSIS — N183 Chronic kidney disease, stage 3 unspecified: Secondary | ICD-10-CM

## 2019-03-15 DIAGNOSIS — E7849 Other hyperlipidemia: Secondary | ICD-10-CM

## 2019-03-15 DIAGNOSIS — I739 Peripheral vascular disease, unspecified: Secondary | ICD-10-CM

## 2019-03-15 DIAGNOSIS — F0280 Dementia in other diseases classified elsewhere without behavioral disturbance: Secondary | ICD-10-CM

## 2019-03-15 DIAGNOSIS — G3109 Other frontotemporal dementia: Secondary | ICD-10-CM

## 2019-03-15 DIAGNOSIS — R7401 Elevation of levels of liver transaminase levels: Secondary | ICD-10-CM

## 2019-03-15 DIAGNOSIS — R740 Nonspecific elevation of levels of transaminase and lactic acid dehydrogenase [LDH]: Secondary | ICD-10-CM | POA: Diagnosis not present

## 2019-03-15 DIAGNOSIS — F411 Generalized anxiety disorder: Secondary | ICD-10-CM

## 2019-03-15 LAB — COMPREHENSIVE METABOLIC PANEL
ALT: 12 U/L (ref 0–35)
AST: 19 U/L (ref 0–37)
Albumin: 4.3 g/dL (ref 3.5–5.2)
Alkaline Phosphatase: 85 U/L (ref 39–117)
BUN: 15 mg/dL (ref 6–23)
CO2: 30 mEq/L (ref 19–32)
Calcium: 9.7 mg/dL (ref 8.4–10.5)
Chloride: 102 mEq/L (ref 96–112)
Creatinine, Ser: 0.89 mg/dL (ref 0.40–1.20)
GFR: 60.58 mL/min (ref >60.00)
Glucose, Bld: 87 mg/dL (ref 70–99)
Potassium: 4.6 mEq/L (ref 3.5–5.1)
Sodium: 141 mEq/L (ref 135–145)
Total Bilirubin: 0.8 mg/dL (ref 0.2–1.2)
Total Protein: 7.2 g/dL (ref 6.0–8.3)

## 2019-03-15 NOTE — Telephone Encounter (Signed)
Forms mailed back to insurance company.

## 2019-03-15 NOTE — Progress Notes (Signed)
HPI:   [name] is a 85 y.o. female, who has history of Alzheimer's dementia is here today with her husband and home health aide for chronic disease management.  Mr. [name] provides history today. Since her hip fracture she has had 3 falls,unharmed. She does not want to have a cane or a walker for assistance. Palliative care, she has an aide 4 hours daily x 6, planning on going to 8 hrs.  Her husband is concerned about elevated AST, he would like fasting labs done today.   Lab Results  Component Value Date   ALT 41 10/06/2018   AST 42 (H) 10/06/2018   ALKPHOS 78 10/06/2018   BILITOT 0.8 10/06/2018  CKD III and HTN, she is on non pharmacologic treatment. BP at home has not been checked.  PAD, currently she is on Aspirin 81 mg and pravastatin 10 mg. According to husband, she is having lower extremity myalgias since she has been taking pravastatin. Negative for edema, erythema, or cyanosis.  She has not had gum/nose bleeding,melena,blood in stool,or gross hematuria.  Lab Results  Component Value Date   WBC 13.1 (H) 10/06/2018   HGB 10.9 (L) 10/06/2018   HCT 34.2 (L) 10/06/2018   MCV 98.3 10/06/2018   PLT 527 (H) 10/06/2018   Ulcer left heel noted after SNF discharge. It has improved. No drainage or erythema.  3 falls since 01/2019. No seriously harm. He had to call EMS to help him to get her up. Unstable gait, she doe snot like to use cane or walker.  Urine incontinence , she is wearing pull ups through the night. Occasionally she has had fecal incontinence, not often.  Has had 2 oral surgeries since hip fracture.  In general Kathryn [name] [name] is progressively declining. She followed with neurologist on 02/23/19, she is on Exelon 6 mg daily. She is also on Vit E 400 U.  Anxiety,she is on Lexapro 10 mg daily. His husband thinks medication is still helping. No depressed mood. She is eating "very good."  She also needs assistance with dressing,grooming,and  bathing.  Review of Systems  Constitutional: Positive for activity change. Negative for appetite change, fatigue and fever.  HENT: Negative for mouth sores, nosebleeds and sore throat.   Eyes: Negative for redness.  Respiratory: Negative for cough, shortness of breath and wheezing.   Cardiovascular: Negative for chest pain, palpitations and leg swelling.  Gastrointestinal: Negative for abdominal pain, nausea and vomiting.       Negative for changes in bowel habits.  Genitourinary: Negative for decreased urine volume, dysuria and hematuria.  Neurological: Negative for syncope and headaches.  Psychiatric/Behavioral: Positive for agitation (sometimes.) and confusion. Negative for hallucinations.  Rest see pertinent positives and negatives per HPI.   Current Outpatient Medications on File Prior to Visit  Medication Sig Dispense Refill  . ASPIRIN 81 PO Take 81 mg by mouth daily.    . Cholecalciferol (VITAMIN D3) 2000 UNITS TABS Take 2,000 Units by mouth daily.     . Coenzyme Q10 (COQ10 PO) Take 50 mg by mouth daily.     Marland Kitchen escitalopram (LEXAPRO) 10 MG tablet Take 10 mg by mouth daily.    . folic acid (FOLVITE) 594 MCG tablet Take 400 mcg by mouth daily.    . polyethylene glycol (MIRALAX / GLYCOLAX) packet Take 17 g by mouth daily as needed for mild constipation. 14 each 0  . pyridoxine (B-6) 100 MG tablet Take 100 mg by mouth daily.    Marland Kitchen  rivastigmine (EXELON) 6 MG capsule TAKE 1 CAPSULE (6 MG TOTAL) 2 (TWO) TIMES DAILY BY MOUTH 60 capsule 11  . senna-docusate (SENOKOT-S) 8.6-50 MG tablet Take 1 tablet by mouth at bedtime as needed for mild constipation.    Marland Kitchen zinc gluconate 50 MG tablet Take 50 mg by mouth daily.    . Zinc Oxide (BOUDREAUXS BUTT PASTE) 16 % OINT Apply 1 application topically as needed. 113 g 6   No current facility-administered medications on file prior to visit.      Past Medical History:  Diagnosis Date  . Alzheimer's dementia ([place])   . Anxiety   . Headache   .  Hypertension    Allergies  Allergen Reactions  . Aricept [Donepezil Hcl] Other (See Comments)    "giving stomach and digestive problems"  . Sulfonamide Derivatives     REACTION: Swollen joints    Social History   Socioeconomic History  . Marital status: Married    Spouse name: [name]   . Number of children: 3  . Years of education: 27  . Highest education level: Not on file  Occupational History  . Not on file  Social Needs  . Financial resource strain: Not on file  . Food insecurity    Worry: Not on file    Inability: Not on file  . Transportation needs    Medical: Not on file    Non-medical: Not on file  Tobacco Use  . Smoking status: Former Smoker    Quit date: 1980    Years since quitting: 40.6  . Smokeless tobacco: Never Used  Substance and Sexual Activity  . Alcohol use: Not Currently    Alcohol/week: 7.0 - 14.0 standard drinks    Types: 7 - 14 Glasses of wine per week    Comment: white wine; update 01/24/19 pt quit drinking alcohol completely  . Drug use: No  . Sexual activity: Not on file  Lifestyle  . Physical activity    Days per week: Not on file    Minutes per session: Not on file  . Stress: Not on file  Relationships  . Social Herbalist on phone: Not on file    Gets together: Not on file    Attends religious service: Not on file    Active member of club or organization: Not on file    Attends meetings of clubs or organizations: Not on file    Relationship status: Not on file  Other Topics Concern  . Not on file  Social History Narrative   Lives at home with husband   Caffeine use: Stopped drinking coffee, drinks maybe 8 oz of unsweet tea daily   Right handed   Goes to [address] [facility] day care for 4 hours twice weekly (update has not been there since 09/28/2018 when she broke her hip)    Vitals:   03/15/19 1044  BP: 120/70  Pulse: 75  Resp: 12  Temp: 97.9 F (36.6 C)  SpO2: 94%   Body mass index is 22.5 kg/m.    Physical Exam  Nursing note and vitals reviewed. Constitutional: She appears well-developed and well-nourished. She is cooperative. No distress.  HENT:  Head: Normocephalic and atraumatic.  Mouth/Throat: Oropharynx is clear and moist and mucous membranes are normal.  Eyes: Pupils are equal, round, and reactive to light. Conjunctivae are normal.  Cardiovascular: Normal rate and regular rhythm.  No murmur heard. DP pulses bilateral.  Respiratory: Effort normal and breath sounds normal. No  respiratory distress.  GI: Soft. There is no abdominal tenderness.  Musculoskeletal:        General: No edema.  Lymphadenopathy:    She has no cervical adenopathy.  Neurological: She is alert. She has normal strength.  Non verbal today. Unstable gait,mainly after getting up,assisted by caregiver.  Skin: Skin is warm. No rash noted. No erythema.     Superficial ulcer covered by clear crust,1.5-2 cm. There is not erythema,induration,or tenderness.    Psychiatric: Her mood appears anxious.  Well groomed, good eye contact.    ASSESSMENT AND PLAN:  Kathryn [name] [name] was seen today for fasting labs, sundown and discuss medications.  Diagnoses and all orders for this visit: Lab Results  Component Value Date   ALT 12 03/15/2019   AST 19 03/15/2019   ALKPHOS 85 03/15/2019   BILITOT 0.8 03/15/2019   Lab Results  Component Value Date   CREATININE 0.89 03/15/2019   BUN 15 03/15/2019   NA 141 03/15/2019   K 4.6 03/15/2019   CL 102 03/15/2019   CO2 30 03/15/2019    Elevated AST (SGOT) Mild. Further recommendations will be given according to LFT's results.  CKD (chronic kidney disease), stage III ([place]). Problem has been stable. Cr 1.0-1.11, eGFR in the 40's. er husband wants renal function also check. Further recommendations according to lab results.  -     Comprehensive metabolic panel  Generalized anxiety disorder Stable. No changes in Lexapro 10 mg daily.  Other frontotemporal dementia  without behavioral disturbance ([place]) Declining. Needs 24/7 care. No changes in current management. Follows with neurologist, Dr [name].  Other hyperlipidemia - At this time there is not significant benefit from statin med+ having side effects. Pravastatin discontinued. Continue low fat diet.  PAD (peripheral artery disease) (HCC) Pulses are present. Aspirin and Pravastatin discontinued.  Need for pneumococcal vaccination -     Pneumococcal polysaccharide vaccine 23-valent greater than or equal to 2yo subcutaneous/IM  Given the fact Kathryn [name] [place] is declining I do not recommend invasive procedures. Some medications discontinued today. I do not think CBC needs to be repeated, mild anemia but no signs of bleeding and asymptomatic.    Return in 6 months (on 09/15/2019), or if symptoms worsen or fail to improve.    Jos Cygan G. [name], MD  [facility]. [place] office.

## 2019-03-15 NOTE — Patient Instructions (Signed)
A few things to remember from today's visit:   Elevated AST (SGOT) - Plan: Comprehensive metabolic panel  Generalized anxiety disorder  Other frontotemporal dementia without behavioral disturbance (HCC)  Other hyperlipidemia Today we discontinue pravastatin and Aspirin. She received pneumonia vaccine.   Please be sure medication list is accurate. If a new problem present, please set up appointment sooner than planned today.

## 2019-03-16 ENCOUNTER — Encounter: Payer: Self-pay | Admitting: Family Medicine

## 2019-03-25 ENCOUNTER — Telehealth: Payer: Self-pay

## 2019-03-25 NOTE — Telephone Encounter (Signed)
Received a message to call Tamika with [facility]. Spoke with Tamika who inquired if Palliative Care had seen patient. Tamika updated that NP saw patient in June and her husband would reach out when another visit is needed. Tamika shared that patient has began to wander with attempts to go outside. Husband is possibly considering placement to facility to ensure patient safety. Stanton Kidney NP updated. Plan is to reach out to patient's husband to offer to schedule a visit with Palliative.

## 2019-03-29 ENCOUNTER — Telehealth: Payer: Self-pay

## 2019-03-29 NOTE — Telephone Encounter (Signed)
Phone call placed to patient's husband to check in and to offer to schedule a follow up visit with Palliative Care. VM left

## 2019-03-29 NOTE — Telephone Encounter (Signed)
Received a return call from patient's husband who was open to scheduling a follow up visit with Palliative Visit. Visit scheduled for Friday 04/01/2019

## 2019-04-01 ENCOUNTER — Encounter: Payer: Self-pay | Admitting: Internal Medicine

## 2019-04-01 ENCOUNTER — Other Ambulatory Visit: Payer: PPO | Admitting: Internal Medicine

## 2019-04-01 ENCOUNTER — Other Ambulatory Visit: Payer: Self-pay

## 2019-04-01 DIAGNOSIS — Z515 Encounter for palliative care: Secondary | ICD-10-CM

## 2019-04-01 NOTE — Progress Notes (Signed)
Aug 28th, 2020 [facility] Palliative Care Consult Note Telephone: [phone]  Fax: [phone]   PATIENT NAME: Kathryn [name] DOB: 09-06-1933 MRN: [id number]   PRIMARY CARE PROVIDER:   [name], [name], MD Melvenia Beam, MD  [facility]   REFERRING PROVIDER:  [name], [name], [address] [place],  [place] [address]   RESPONSIBLE PARTY: (Spouse) Zekia Grahek [phone], [phone].  (son) Suraya Rajagopal [phone], (W) [phone] [email or web address]   IMPRESSION / RECOMENTATIONS: 1.Advance Care Planning:  A. Advance Care Directives: Completed DNR and MOST form today. Original copies left in home. I provided some associated patient educational materials for him to review with son.  B.Goals of Care:  -Keep patient home with additional supports as needed. Currently has Aides from [facility]. He is awaiting call back from his long term insurance carries. He would like to have 8hours /day 6 days a week.  -Spouse has completed an application to [facility], so that there is a back up plan in place should home care be insurmountable for spouse to manage.  -Spouse hoping to arrange for doctors making house calls for home visits, as it's exhausting for both spouse and patient to go on trips outside the home. He dislikes use of telehealth.   2.Cognitive / Functional decline:  FAST 7a. Patient pleasantly confused; oriented to self only. Some coherent short sentences but off topic and often work salad. Needs cuing (both verbal and hands on) to follow simple tasks. She can follow simple commands; shakes my offered hand. Able to transfer and ambulate without assistive devices. Needs assist to go up and down a few stairs. She has had 2 falls over the last 2 months, with only mild abrasion. She occasionally needs to be fed. Good appetite of a soft diet. Arthritic hands make it difficult to manage a cup. Current  weight is 126lbs. At height of 5'2" her BMI is 22.3 kg/m2. She has a small callus on L heel; residual from a slowly healing pressure injury. Patient's neurologist has assisted with medication simplification. Trial of small dose Ativan for anxiety was poorly tolerated, with SE excessive somnolence.   3. Family Supports:  Spouse is the primary caregiver. He keeps meticulous records of all things that concern the patient and her care. He is feeling marked caregiver fatigue.   4. Follow up Palliative Care Visit: Spouse wishes to contact me on a prn basis; he has my contact information.    HISTORY OF PRESENT ILLNESS:  [name] is a 85 yo female with Hx of frontotemporal dementia (without behavioral disturbances), anxiety, CKD (stage 3; Creatine 10/06/2018: 1.09); GFR 47), HLD, and PAD, whose state has progressively declining. L hip repair (09/29/2018) after fall. Subsequent SNF [facility]) then returned to home 10/27/2018. This is a f/u Palliative Care visit from 01/18/2019.    CODE STATUS: DNR/MOST completed today and uploaded into [facility] EMR.   PPS: weak 40%  HOSPICE ELIGIBILITY/DIAGNOSIS: TBD  PAST MEDICAL HISTORY:  Past Medical History:  Diagnosis Date  . Alzheimer's dementia ([place])   . Anxiety   . Headache   . Hypertension     SOCIAL HX:  Social History   Tobacco Use  . Smoking status: Former Smoker    Quit date: 1980    Years since quitting: 40.6  . Smokeless tobacco: Never Used  Substance Use Topics  . Alcohol use: Not Currently    Alcohol/week: 7.0 -  14.0 standard drinks    Types: 7 - 14 Glasses of wine per week    Comment: white wine; update 01/24/19 pt quit drinking alcohol completely    ALLERGIES:  Allergies  Allergen Reactions  . Aricept [Donepezil Hcl] Other (See Comments)    "giving stomach and digestive problems"  . Sulfonamide Derivatives     REACTION: Swollen joints     PERTINENT MEDICATIONS:  Outpatient Encounter Medications as of 04/01/2019  Medication Sig  .  Cholecalciferol (VITAMIN D3) 2000 UNITS TABS Take 2,000 Units by mouth daily.   . Coenzyme Q10 (COQ10 PO) Take 50 mg by mouth daily.   Marland Kitchen escitalopram (LEXAPRO) 10 MG tablet Take 10 mg by mouth daily.  . folic acid (FOLVITE) A999333 MCG tablet Take 400 mcg by mouth daily.  . polyethylene glycol (MIRALAX / GLYCOLAX) packet Take 17 g by mouth daily as needed for mild constipation.  Marland Kitchen pyridoxine (B-6) 100 MG tablet Take 100 mg by mouth daily.  Marland Kitchen senna-docusate (SENOKOT-S) 8.6-50 MG tablet Take 1 tablet by mouth at bedtime as needed for mild constipation.  Marland Kitchen zinc gluconate 50 MG tablet Take 50 mg by mouth daily.  . Zinc Oxide (BOUDREAUXS BUTT PASTE) 16 % OINT Apply 1 application topically as needed.  . [DISCONTINUED] ASPIRIN 81 PO Take 81 mg by mouth daily.  . [DISCONTINUED] rivastigmine (EXELON) 6 MG capsule TAKE 1 CAPSULE (6 MG TOTAL) 2 (TWO) TIMES DAILY BY MOUTH   No facility-administered encounter medications on file as of 04/01/2019.     PHYSICAL EXAM:   Husband in attendance. Limited d/t  COVID -19 social distancing  General: NAD, frail appearing, thin. Maintains eye contact; rambling off topic speech. Pleasantly conversant Extremities: no edema, no joint deformities Skin: no rashes. Small scab on heel of left heel Neurological: Overall weakness but non-focal   Julianne Handler, NP

## 2019-04-14 ENCOUNTER — Encounter: Payer: Self-pay | Admitting: Family Medicine

## 2019-04-15 ENCOUNTER — Encounter: Payer: Self-pay | Admitting: Family Medicine

## 2019-04-16 ENCOUNTER — Encounter: Payer: Self-pay | Admitting: Family Medicine

## 2019-04-20 ENCOUNTER — Encounter: Payer: Self-pay | Admitting: Family Medicine

## 2019-04-20 NOTE — Telephone Encounter (Signed)
Patients husband does not need to make an appointment and he said that they think that they figured out the crisis and that he will send Dr. [name] a message.

## 2019-06-22 ENCOUNTER — Other Ambulatory Visit: Payer: Self-pay

## 2019-06-22 ENCOUNTER — Inpatient Hospital Stay (HOSPITAL_COMMUNITY)
Admission: EM | Admit: 2019-06-22 | Discharge: 2019-06-25 | DRG: 101 | Disposition: A | Discharge status: Home Health | Payer: PPO | Attending: Internal Medicine | Admitting: Internal Medicine

## 2019-06-22 ENCOUNTER — Encounter (HOSPITAL_COMMUNITY): Payer: Self-pay | Admitting: Emergency Medicine

## 2019-06-22 ENCOUNTER — Emergency Department (HOSPITAL_COMMUNITY): Payer: PPO

## 2019-06-22 DIAGNOSIS — G309 Alzheimer's disease, unspecified: Secondary | ICD-10-CM | POA: Diagnosis not present

## 2019-06-22 DIAGNOSIS — Z96642 Presence of left artificial hip joint: Secondary | ICD-10-CM | POA: Diagnosis present

## 2019-06-22 DIAGNOSIS — Z882 Allergy status to sulfonamides status: Secondary | ICD-10-CM | POA: Diagnosis not present

## 2019-06-22 DIAGNOSIS — Z888 Allergy status to other drugs, medicaments and biological substances status: Secondary | ICD-10-CM | POA: Diagnosis not present

## 2019-06-22 DIAGNOSIS — Z87440 Personal history of urinary (tract) infections: Secondary | ICD-10-CM | POA: Diagnosis not present

## 2019-06-22 DIAGNOSIS — Z8349 Family history of other endocrine, nutritional and metabolic diseases: Secondary | ICD-10-CM

## 2019-06-22 DIAGNOSIS — I739 Peripheral vascular disease, unspecified: Secondary | ICD-10-CM | POA: Diagnosis not present

## 2019-06-22 DIAGNOSIS — R531 Weakness: Secondary | ICD-10-CM | POA: Diagnosis not present

## 2019-06-22 DIAGNOSIS — N183 Chronic kidney disease, stage 3 unspecified: Secondary | ICD-10-CM | POA: Diagnosis present

## 2019-06-22 DIAGNOSIS — Z66 Do not resuscitate: Secondary | ICD-10-CM | POA: Diagnosis not present

## 2019-06-22 DIAGNOSIS — R569 Unspecified convulsions: Secondary | ICD-10-CM | POA: Diagnosis not present

## 2019-06-22 DIAGNOSIS — N3000 Acute cystitis without hematuria: Secondary | ICD-10-CM | POA: Diagnosis present

## 2019-06-22 DIAGNOSIS — Z515 Encounter for palliative care: Secondary | ICD-10-CM | POA: Diagnosis not present

## 2019-06-22 DIAGNOSIS — R0902 Hypoxemia: Secondary | ICD-10-CM | POA: Diagnosis not present

## 2019-06-22 DIAGNOSIS — G319 Degenerative disease of nervous system, unspecified: Secondary | ICD-10-CM | POA: Diagnosis present

## 2019-06-22 DIAGNOSIS — N390 Urinary tract infection, site not specified: Secondary | ICD-10-CM | POA: Diagnosis present

## 2019-06-22 DIAGNOSIS — R4701 Aphasia: Secondary | ICD-10-CM | POA: Diagnosis not present

## 2019-06-22 DIAGNOSIS — Z87891 Personal history of nicotine dependence: Secondary | ICD-10-CM | POA: Diagnosis not present

## 2019-06-22 DIAGNOSIS — G3109 Other frontotemporal dementia: Secondary | ICD-10-CM | POA: Diagnosis not present

## 2019-06-22 DIAGNOSIS — Z820 Family history of epilepsy and other diseases of the nervous system: Secondary | ICD-10-CM | POA: Diagnosis not present

## 2019-06-22 DIAGNOSIS — E785 Hyperlipidemia, unspecified: Secondary | ICD-10-CM | POA: Diagnosis not present

## 2019-06-22 DIAGNOSIS — R404 Transient alteration of awareness: Secondary | ICD-10-CM | POA: Diagnosis not present

## 2019-06-22 DIAGNOSIS — I129 Hypertensive chronic kidney disease with stage 1 through stage 4 chronic kidney disease, or unspecified chronic kidney disease: Secondary | ICD-10-CM | POA: Diagnosis not present

## 2019-06-22 DIAGNOSIS — F0280 Dementia in other diseases classified elsewhere without behavioral disturbance: Secondary | ICD-10-CM | POA: Diagnosis present

## 2019-06-22 DIAGNOSIS — Z20828 Contact with and (suspected) exposure to other viral communicable diseases: Secondary | ICD-10-CM | POA: Diagnosis present

## 2019-06-22 DIAGNOSIS — I10 Essential (primary) hypertension: Secondary | ICD-10-CM | POA: Diagnosis present

## 2019-06-22 DIAGNOSIS — F0390 Unspecified dementia without behavioral disturbance: Secondary | ICD-10-CM | POA: Diagnosis present

## 2019-06-22 LAB — URINALYSIS, ROUTINE W REFLEX MICROSCOPIC
Bilirubin Urine: NEGATIVE
Glucose, UA: NEGATIVE mg/dL
Hgb urine dipstick: NEGATIVE
Ketones, ur: NEGATIVE mg/dL
Nitrite: POSITIVE — AB
Protein, ur: NEGATIVE mg/dL
Specific Gravity, Urine: 1.011 (ref 1.005–1.030)
pH: 5 (ref 5.0–8.0)

## 2019-06-22 LAB — CBC
HCT: 41.2 % (ref 36.0–46.0)
Hemoglobin: 13.4 g/dL (ref 12.0–15.0)
MCH: 31.1 pg (ref 26.0–34.0)
MCHC: 32.5 g/dL (ref 30.0–36.0)
MCV: 95.6 fL (ref 80.0–100.0)
Platelets: 294 10*3/uL (ref 150–400)
RBC: 4.31 MIL/uL (ref 3.87–5.11)
RDW: 13.2 % (ref 11.5–15.5)
WBC: 15.3 10*3/uL — ABNORMAL HIGH (ref 4.0–10.5)
nRBC: 0 % (ref 0.0–0.2)

## 2019-06-22 LAB — BASIC METABOLIC PANEL
Anion gap: 12 (ref 5–15)
BUN: 15 mg/dL (ref 8–23)
CO2: 23 mmol/L (ref 22–32)
Calcium: 9.1 mg/dL (ref 8.9–10.3)
Chloride: 106 mmol/L (ref 98–111)
Creatinine, Ser: 0.8 mg/dL (ref 0.44–1.00)
GFR calc Af Amer: >60 mL/min (ref >60)
GFR calc non Af Amer: >60 mL/min (ref >60)
Glucose, Bld: 99 mg/dL (ref 70–99)
Potassium: 4 mmol/L (ref 3.5–5.1)
Sodium: 141 mmol/L (ref 135–145)

## 2019-06-22 LAB — PROTIME-INR
INR: 1.1 (ref 0.8–1.2)
Prothrombin Time: 14.2 seconds (ref 11.4–15.2)

## 2019-06-22 MED ORDER — SODIUM CHLORIDE 0.9 % IV SOLN
1.0000 g | Freq: Once | INTRAVENOUS | Status: AC
  Administered 2019-06-22: 1 g via INTRAVENOUS
  Filled 2019-06-22: qty 10

## 2019-06-22 NOTE — ED Provider Notes (Signed)
[facility] EMERGENCY DEPARTMENT Provider Note   CSN: [id number] Arrival date & time: 06/22/19  1354     History   Chief Complaint Chief Complaint  Patient presents with  . Seizures    HPI Kathryn [name] is a 85 y.o. female.     85yo female brought in by EMS from home, husband at bedside.  Husband states patient was under the care of the home health aide when she began to have a seizure, home health aide came and got her husband who witnessed full body tensing lasting about 2 minutes with foaming at the mouth.  Patient is reportedly back at baseline at this time, she is nonverbal due to dementia, no history of prior seizures.  Husband reports patient was slightly more sleepy this morning than usual otherwise nothing significant in regards to her lately.     Past Medical History:  Diagnosis Date  . Alzheimer's dementia ([place])   . Anxiety   . Headache   . Hypertension     Patient Active Problem List   Diagnosis Date Noted  . Pressure injury of skin 10/05/2018  . Hip fracture ([place]) 09/28/2018  . Acute pulmonary edema ([place]) 09/28/2018  . Acute on chronic respiratory failure with hypoxia ([place]) 09/28/2018  . Constipation 09/24/2018  . Generalized osteoarthritis of multiple sites 09/24/2018  . PAD (peripheral artery disease) ([place]) 07/20/2018  . HLD (hyperlipidemia) 06/15/2018  . Unstable gait 02/19/2018  . Aphasia 09/14/2017  . Headache 07/10/2016  . CKD (chronic kidney disease), stage III ([place]) 06/20/2016  . Dementia ([place]) 09/04/2015  . Generalized anxiety disorder 05/29/2015  . HTN (hypertension) 05/29/2015  . Memory loss 05/28/2015    Past Surgical History:  Procedure Laterality Date  . BREAST BIOPSY    . HIP ARTHROPLASTY Left 09/29/2018   Procedure: left hip (HEMIARTHROPLASTY);  Surgeon: Marybelle Killings, MD;  Location: [facility];  Service: Orthopedics;  Laterality: Left;     OB History   No obstetric history on file.      Home Medications    Prior to Admission medications   Medication Sig Start Date End Date Taking? Authorizing Provider  Cholecalciferol (VITAMIN D3) 2000 UNITS TABS Take 2,000 Units by mouth daily.     [provider]  Coenzyme Q10 (COQ10 PO) Take 50 mg by mouth daily.     [provider]  escitalopram (LEXAPRO) 10 MG tablet Take 10 mg by mouth daily. 08/28/18   [provider]  folic acid (FOLVITE) A999333 MCG tablet Take 400 mcg by mouth daily.    [provider]  polyethylene glycol (MIRALAX / GLYCOLAX) packet Take 17 g by mouth daily as needed for mild constipation. 10/01/18   Nita Sells, MD  pyridoxine (B-6) 100 MG tablet Take 100 mg by mouth daily.    [provider]  senna-docusate (SENOKOT-S) 8.6-50 MG tablet Take 1 tablet by mouth at bedtime as needed for mild constipation. 10/01/18   Nita Sells, MD  zinc gluconate 50 MG tablet Take 50 mg by mouth daily.    [provider]  Zinc Oxide (BOUDREAUXS BUTT PASTE) 16 % OINT Apply 1 application topically as needed. 12/20/18   [name], [name], MD    Family History Family History  Problem Relation Age of Onset  . Thyroid disease Mother   . Alzheimer's disease Mother   . Dementia Mother   . Hearing loss Brother   . Hearing loss Brother        also developing  memory problems  . Heart Problems Brother     Social History Social History   Tobacco Use  . Smoking status: Former Smoker    Quit date: 1980    Years since quitting: 40.9  . Smokeless tobacco: Never Used  Substance Use Topics  . Alcohol use: Not Currently    Alcohol/week: 7.0 - 14.0 standard drinks    Types: 7 - 14 Glasses of wine per week    Comment: white wine; update 01/24/19 pt quit drinking alcohol completely  . Drug use: No     Allergies   Aricept [donepezil hcl] and Sulfonamide derivatives   Review of Systems Review of Systems  Unable to perform ROS: Dementia     Physical Exam Updated Vital Signs BP (!)  166/81   Temp 98.2 F (36.8 C) (Axillary)   Resp 16   SpO2 96%   Physical Exam Vitals signs and nursing note reviewed.  Constitutional:      Appearance: Normal appearance.  HENT:     Head: Normocephalic and atraumatic.  Cardiovascular:     Rate and Rhythm: Normal rate and regular rhythm.     Pulses: Normal pulses.     Heart sounds: Normal heart sounds.  Pulmonary:     Effort: Pulmonary effort is normal.     Breath sounds: Normal breath sounds.  Abdominal:     Palpations: Abdomen is soft.     Tenderness: There is no abdominal tenderness.  Musculoskeletal:        General: No tenderness.     Right lower leg: No edema.     Left lower leg: No edema.  Skin:    General: Skin is warm and dry.     Findings: No erythema or rash.  Neurological:     Mental Status: She is alert. Mental status is at baseline.     GCS: GCS eye subscore is 4. GCS verbal subscore is 3. GCS motor subscore is 6.     Comments: Right leg weakness. Equal grip strength.      ED Treatments / Results  Labs (all labs ordered are listed, but only abnormal results are displayed) Labs Reviewed  BASIC METABOLIC PANEL  CBC  PROTIME-INR  URINALYSIS, ROUTINE W REFLEX MICROSCOPIC  CBG MONITORING, ED    EKG EKG Interpretation  Date/Time:  Wednesday June 22 2019 14:13:06 EST Ventricular Rate:  77 PR Interval:    QRS Duration: 87 QT Interval:  394 QTC Calculation: 446 R Axis:   55 Text Interpretation: Sinus rhythm No STEMI Confirmed by Nanda Quinton [phone]) on 06/22/2019 3:04:00 PM   Radiology No results found.  Procedures Procedures (including critical care time)  Medications Ordered in ED Medications - No data to display   Initial Impression / Assessment and Plan / ED Course  I have reviewed the triage vital signs and the nursing notes.  Pertinent labs & imaging results that were available during my care of the patient were reviewed by me and considered in my medical decision making (see  chart for details).  Clinical Course as of Jun 21 1510  Wed Jun 22, 4715  [id number] 85 year old female brought in by MS from home for seizure-like activity followed by postictal state now back to baseline.  No history of prior seizures.  On exam patient has equal grip strength, does seem to have some right leg weakness, husband states she is normally ambulatory with assistance of the aide.  Care signed out pending work-up.   [LM]    Clinical  Course User Index [LM] Tacy Learn, PA-C      Final Clinical Impressions(s) / ED Diagnoses   Final diagnoses:  None    ED Discharge Orders    None       Tacy Learn, PA-C 06/22/19 1511    Long, Wonda Old[facility], MD 06/23/19 1410

## 2019-06-22 NOTE — ED Triage Notes (Signed)
Pt arrives via [facility] from home for c/o seizure like activity that was witnessed by patient's husband and home health aide. Reports pt was sitting on the cough when her entire body stiffened up for about 2 minutes. Pt appeared post ictal upon ems arrival.  Pt has hx of advanced dementia and only follows some commands, no seizure hx, ems reports pt moves all extremities. Pt alert, ems vss: 156/83, hr 80, rr 20, O2 sat 95% on room air. cbg 99, temp 97.7.

## 2019-06-22 NOTE — H&P (Signed)
History and Physical    [name] [id number] DOB: [birth year] DOA: 06/22/2019  PCP: System, Pcp Not In    Patient coming from: home   Chief Complaint: Patient was past medical history of Alzheimer, hypertension, anxiety who is nonverbal t2 minutes of convulsion and foaming at the mouth.      HPI: Kathryn [name] is a 85 y.o. female with medical history significant of for severe dementia hypertension per her husband has an episode of convulsion at home with foaming at the mouth.  Husband reports patient was more sleepy this morning than usual..  Paramedics came and brought her to emergency room by the time she came here she was back to normal.  ED Course:   Patient is alert and makes eye contact but answers a question She has a white count of 15.3 hemoglobin 13.4 hematocrit 41 platelets 94 urinalysis positive for UTI EKG normal sinus no ST-T changes. CT of the head negative  Review of Systems: As per HPI otherwise 10 point review of systems negative.  Except for seizures  Past Medical History:  Diagnosis Date  . Alzheimer's dementia ([place])   . Anxiety   . Headache   . Hypertension     Past Surgical History:  Procedure Laterality Date  . BREAST BIOPSY    . HIP ARTHROPLASTY Left 09/29/2018   Procedure: left hip (HEMIARTHROPLASTY);  Surgeon: Marybelle Killings, MD;  Location: [facility];  Service: Orthopedics;  Laterality: Left;     reports that she quit smoking about 40 years ago. She has never used smokeless tobacco. She reports previous alcohol use of about 7.0 - 14.0 standard drinks of alcohol per week. She reports that she does not use drugs.  Allergies  Allergen Reactions  . Aricept [Donepezil Hcl] Other (See Comments)    "giving stomach and digestive problems"  . Sulfonamide Derivatives     REACTION: Swollen joints    Family History  Problem Relation Age of Onset  . Thyroid disease Mother   . Alzheimer's disease Mother   . Dementia Mother   . Hearing loss  Brother   . Hearing loss Brother        also developing memory problems  . Heart Problems Brother      Prior to Admission medications   Medication Sig Start Date End Date Taking? Authorizing Provider  Cholecalciferol (VITAMIN D3) 2000 UNITS TABS Take 2,000 Units by mouth daily.    Yes [provider]  Coenzyme Q10 (COQ10 PO) Take 50 mg by mouth daily.    Yes [provider]  escitalopram (LEXAPRO) 10 MG tablet Take 10 mg by mouth daily. 08/28/18  Yes [provider]  folic acid (FOLVITE) A999333 MCG tablet Take 400 mcg by mouth daily.   Yes [provider]  pyridoxine (B-6) 100 MG tablet Take 100 mg by mouth daily.   Yes [provider]  QUEtiapine (SEROQUEL) 25 MG tablet Take 8 mg by mouth at bedtime. Pt family cuts tablet in 4 parts.   Yes [provider]  senna-docusate (SENOKOT-S) 8.6-50 MG tablet Take 1 tablet by mouth at bedtime as needed for mild constipation. 10/01/18  Yes Nita Sells, MD  zinc gluconate 50 MG tablet Take 50 mg by mouth daily.   Yes [provider]  polyethylene glycol (MIRALAX / GLYCOLAX) packet Take 17 g by mouth daily as needed for mild constipation. Patient not taking: Reported on 06/22/2019 10/01/18   Nita Sells, MD  Zinc Oxide (BOUDREAUXS BUTT PASTE)  16 % OINT Apply 1 application topically as needed. Patient not taking: Reported on 06/22/2019 12/20/18   [name], [name], MD    Physical Exam: Vitals:   06/22/19 2015 06/22/19 2030 06/22/19 2033 06/22/19 2100  BP: (!) 145/74 (!) 133/103  (!) 145/93  Pulse: 70 75 71 71  Resp: 16 17 17 15   Temp:      TempSrc:      SpO2: 93% 90% 92% 91%    Constitutional: NAD, calm, comfortable Vitals:   06/22/19 2015 06/22/19 2030 06/22/19 2033 06/22/19 2100  BP: (!) 145/74 (!) 133/103  (!) 145/93  Pulse: 70 75 71 71  Resp: 16 17 17 15   Temp:      TempSrc:      SpO2: 93% 90% 92% 91%   Eyes: PERRL, lids and conjunctivae normal ENMT: Mucous  membranes are moist. Posterior pharynx clear of any exudate or lesions.Normal dentition.  Neck: normal, supple, no masses, no thyromegaly Respiratory: clear to auscultation bilaterally, no wheezing, no crackles. Normal respiratory effort. No accessory muscle use.  Cardiovascular: Regular rate and rhythm, no murmurs / rubs / gallops. No extremity edema. 2+ pedal pulses. No carotid bruits.  Abdomen: no tenderness, no masses palpated. No hepatosplenomegaly. Bowel sounds positive.  Musculoskeletal: no clubbing / cyanosis. No joint deformity upper and lower extremities. Good ROM, no contractures. Normal muscle tone.  Skin: no rashes, lesions, ulcers. No induration Neurologic: CN 2-12 grossly intact. Sensation intact, DTR normal. Strength 5/5 in all 4.  Psychiatric: Unable to perform because of mental status l   Labs on Admission: I have personally reviewed following labs and imaging studies  CBC: Recent Labs  Lab 06/22/19 1505  WBC 15.3*  HGB 13.4  HCT 41.2  MCV 95.6  PLT [id number]   Basic Metabolic Panel: Recent Labs  Lab 06/22/19 1505  NA 141  K 4.0  CL 106  CO2 23  GLUCOSE 99  BUN 15  CREATININE 0.80  CALCIUM 9.1   GFR: CrCl cannot be calculated (Unknown ideal weight.). Liver Function Tests: No results for input(s): AST, ALT, ALKPHOS, BILITOT, PROT, ALBUMIN in the last 168 hours. No results for input(s): LIPASE, AMYLASE in the last 168 hours. No results for input(s): AMMONIA in the last 168 hours. Coagulation Profile: Recent Labs  Lab 06/22/19 1505  INR 1.1   Cardiac Enzymes: No results for input(s): CKTOTAL, CKMB, CKMBINDEX, TROPONINI in the last 168 hours. BNP (last 3 results) No results for input(s): PROBNP in the last 8760 hours. HbA1C: No results for input(s): HGBA1C in the last 72 hours. CBG: No results for input(s): GLUCAP in the last 168 hours. Lipid Profile: No results for input(s): CHOL, HDL, LDLCALC, TRIG, CHOLHDL, LDLDIRECT in the last 72 hours. Thyroid  Function Tests: No results for input(s): TSH, T4TOTAL, FREET4, T3FREE, THYROIDAB in the last 72 hours. Anemia Panel: No results for input(s): VITAMINB12, FOLATE, FERRITIN, TIBC, IRON, RETICCTPCT in the last 72 hours. Urine analysis:    Component Value Date/Time   COLORURINE YELLOW 06/22/2019 1822   APPEARANCEUR CLEAR 06/22/2019 1822   LABSPEC 1.011 06/22/2019 1822   PHURINE 5.0 06/22/2019 1822   GLUCOSEU NEGATIVE 06/22/2019 1822   GLUCOSEU NEGATIVE 06/29/2018 1551   HGBUR NEGATIVE 06/22/2019 1822   BILIRUBINUR NEGATIVE 06/22/2019 1822   BILIRUBINUR neg 09/24/2018 1212   KETONESUR NEGATIVE 06/22/2019 1822   PROTEINUR NEGATIVE 06/22/2019 1822   UROBILINOGEN 0.2 09/24/2018 1212   UROBILINOGEN 0.2 06/29/2018 1551   NITRITE POSITIVE (A) 06/22/2019 1822   LEUKOCYTESUR TRACE (  A) 06/22/2019 1822    Radiological Exams on Admission: Ct Head Wo Contrast  Result Date: 06/22/2019 CLINICAL DATA:  Seizure EXAM: CT HEAD WITHOUT CONTRAST TECHNIQUE: Contiguous axial images were obtained from the base of the skull through the vertex without intravenous contrast. COMPARISON:  July 20, 2018 FINDINGS: Brain: No evidence of acute territorial infarction, hemorrhage, hydrocephalus,extra-axial collection or mass lesion/mass effect. There is dilatation the ventricles and sulci consistent with age-related atrophy. Low-attenuation changes in the deep white matter consistent with small vessel ischemia. Vascular: No hyperdense vessel or unexpected calcification. Skull: The skull is intact. No fracture or focal lesion identified. Sinuses/Orbits: The visualized paranasal sinuses and mastoid air cells are clear. The orbits and globes intact. Other: None IMPRESSION: No acute intracranial abnormality. Findings consistent with age related atrophy and chronic small vessel ischemia Electronically Signed   By: Prudencio Pair M.D.   On: 06/22/2019 15:32    EKG: Independently reviewed.  Normal sinus no acute ST-T changes   Assessment/Plan Active Problems:   HTN (hypertension)   Dementia ([facility])   CKD (chronic kidney disease), stage III ([facility])   Aphasia   HLD (hyperlipidemia)   PAD (peripheral artery disease) ([facility])   Generalized-onset seizures ([facility])   Acute lower UTI   Seizures ([facility])    Seizures New onset ER physician discussed the case with neurologist Suggestion seizures secondary to UTI CT of the head negative Plan MRI  EEG  Urinary tract infection Urine culture Rocephin  Severe dementia With aphasia Plan resume home medication   DVT prophylaxis: Lovenox Code Status: DNR Family Communication: With her husband Disposition Plan: Discharge home Consults called: no Admission status: Full admission   Amy Gothard G Mackinley Kiehn MD [facility]  If 7PM-7AM, please contact night-coverage [email or web address]   06/22/2019, 9:40 PM

## 2019-06-22 NOTE — ED Provider Notes (Signed)
Care assumed from Kathryn Broad, PA-C at shift change with labs and CT pending.   In brief, this patient is a pleasant history of Alzheimer's dementia brought in by EMS for evaluation of concern for seizure-like activity.  Per husband, home health aide at home, got husband regarding concerns of shaking.  He states that he noticed about 2 minutes of full body tensing with foaming at the mouth.  She has no baseline history of seizures and is on any seizure medications.  On ED arrival, he does report that she is back at baseline.  He does state that she is nonverbal secondary to dementia.  Patient's husband states that she was slightly more sleepy this morning but otherwise has not been experiencing any recent sicknesses, fevers.  At baseline, she walks with the assistance of an aide.  Please see note from previous provider for full history/physical exam.   Physical Exam  BP (!) 143/77   Pulse 74   Temp 98.2 F (36.8 C) (Axillary)   Resp 13   SpO2 92%   Physical Exam  ED Course/Procedures   Clinical Course as of Jun 21 2025  Wed Jun 21, 6429  2666 85 year old female brought in by MS from home for seizure-like activity followed by postictal state now back to baseline.  No history of prior seizures.  On exam patient has equal grip strength, does seem to have some right leg weakness, husband states she is normally ambulatory with assistance of the aide.  Care signed out pending work-up.   [LM]    Clinical Course User Index [LM] Tacy Learn, PA-C    Procedures   Results for orders placed or performed during the hospital encounter of 06/22/19 (from the past 24 hour(s))  Basic metabolic panel - if new onset seizures     Status: None   Collection Time: 06/22/19  3:05 PM  Result Value Ref Range   Sodium 141 135 - 145 mmol/L   Potassium 4.0 3.5 - 5.1 mmol/L   Chloride 106 98 - 111 mmol/L   CO2 23 22 - 32 mmol/L   Glucose, Bld 99 70 - 99 mg/dL   BUN 15 8 - 23 mg/dL   Creatinine, Ser 0.80  0.44 - 1.00 mg/dL   Calcium 9.1 8.9 - 10.3 mg/dL   GFR calc non Af Amer >60 >60 mL/min   GFR calc Af Amer >60 >60 mL/min   Anion gap 12 5 - 15  CBC - if new onset seizures     Status: Abnormal   Collection Time: 06/22/19  3:05 PM  Result Value Ref Range   WBC 15.3 (H) 4.0 - 10.5 K/uL   RBC 4.31 3.87 - 5.11 MIL/uL   Hemoglobin 13.4 12.0 - 15.0 g/dL   HCT 41.2 36.0 - 46.0 %   MCV 95.6 80.0 - 100.0 fL   MCH 31.1 26.0 - 34.0 pg   MCHC 32.5 30.0 - 36.0 g/dL   RDW 13.2 11.5 - 15.5 %   Platelets 294 150 - 400 K/uL   nRBC 0.0 0.0 - 0.2 %  Protime-INR     Status: None   Collection Time: 06/22/19  3:05 PM  Result Value Ref Range   Prothrombin Time 14.2 11.4 - 15.2 seconds   INR 1.1 0.8 - 1.2  Urinalysis, Routine w reflex microscopic     Status: Abnormal   Collection Time: 06/22/19  6:22 PM  Result Value Ref Range   Color, Urine YELLOW YELLOW   APPearance CLEAR  CLEAR   Specific Gravity, Urine 1.011 1.005 - 1.030   pH 5.0 5.0 - 8.0   Glucose, UA NEGATIVE NEGATIVE mg/dL   Hgb urine dipstick NEGATIVE NEGATIVE   Bilirubin Urine NEGATIVE NEGATIVE   Ketones, ur NEGATIVE NEGATIVE mg/dL   Protein, ur NEGATIVE NEGATIVE mg/dL   Nitrite POSITIVE (A) NEGATIVE   Leukocytes,Ua TRACE (A) NEGATIVE   RBC / HPF 0-5 0 - 5 RBC/hpf   WBC, UA 0-5 0 - 5 WBC/hpf   Bacteria, UA MANY (A) NONE SEEN   Mucus PRESENT      MDM    MDM:  Leukocytosis of 15.3.  BMP is unremarkable.  UA is positive for nitrites, leukocytes with bacteria.  We will plan to give Rocephin.  Discussed results with husband.  He states that she was recently started on Seroquel about 5 weeks ago.  She takes one fourth of a 25 mg pill a day.  Discussed patient with Dr. Cheral Marker (Neurology).  He feels that seizure may be related to infectious etiology as indicated by leukocytosis.  He recommends medicine admission with plans to get an MRI and EEG.  Neuro will consult as needed.  Discussed with hospitalist who agrees for admission.     1. Acute cystitis without hematuria   2. Seizure-like activity ([place])      Portions of this note were generated with [employer]. Dictation errors may occur despite best attempts at proofreading.   Volanda Napoleon, PA-C 06/22/19 2026    Hayden Rasmussen, MD 06/23/19 [phone]

## 2019-06-22 NOTE — ED Notes (Signed)
ED TO INPATIENT HANDOFF REPORT  ED Nurse Name and Phone #: [phone] Lauree Chandler., RN  S Name/Age/Gender Kathryn [name] 85 y.o. female Room/Bed: 035C/035C  Code Status   Code Status: Prior  Home/SNF/Other Home Disoriented x4 Is this baseline? Yes   Triage Complete: Triage complete  Chief Complaint SZ  Triage Note Pt arrives via [facility] from home for c/o seizure like activity that was witnessed by patient's husband and home health aide. Reports pt was sitting on the cough when her entire body stiffened up for about 2 minutes. Pt appeared post ictal upon ems arrival.  Pt has hx of advanced dementia and only follows some commands, no seizure hx, ems reports pt moves all extremities. Pt alert, ems vss: 156/83, hr 80, rr 20, O2 sat 95% on room air. cbg 99, temp 97.7.    Allergies Allergies  Allergen Reactions  . Aricept [Donepezil Hcl] Other (See Comments)    "giving stomach and digestive problems"  . Sulfonamide Derivatives     REACTION: Swollen joints    Level of Care/Admitting Diagnosis ED Disposition    ED Disposition Condition [facility] Area: [place] [[id number]]  Level of Care: Telemetry Medical [104]  Covid Evaluation: Asymptomatic Screening Protocol (No Symptoms)  Diagnosis: Seizures [facility]) [id number]  Admitting Physician: Assunta Found [id number]  Attending Physician: Assunta Found [id number]  Estimated length of stay: 3 - 4 days  Certification:: I certify this patient will need inpatient services for at least 2 midnights  PT Class (Do Not Modify): Inpatient [101]  PT Acc Code (Do Not Modify): Private [1]       B Medical/Surgery History Past Medical History:  Diagnosis Date  . Alzheimer's dementia ([place])   . Anxiety   . Headache   . Hypertension    Past Surgical History:  Procedure Laterality Date  . BREAST BIOPSY    . HIP ARTHROPLASTY Left 09/29/2018   Procedure: left hip (HEMIARTHROPLASTY);  Surgeon: Marybelle Killings, MD;  Location: [facility];  Service: Orthopedics;  Laterality: Left;     A IV Location/Drains/Wounds Patient Lines/Drains/Airways Status   Active Line/Drains/Airways    Name:   Placement date:   Placement time:   Site:   Days:   Peripheral IV 06/22/19 Left Antecubital   06/22/19    1416    Antecubital   less than 1   Incision (Closed) 09/29/18 Hip Left   09/29/18    1553     266   Pressure Injury 10/03/18 Stage II -  Partial thickness loss of dermis presenting as a shallow open ulcer with a red, pink wound bed without slough.   10/03/18    1200     262          Intake/Output Last 24 hours  Intake/Output Summary (Last 24 hours) at 06/22/2019 2352 Last data filed at 06/22/2019 2113 Gross per 24 hour  Intake 92.21 ml  Output -  Net 92.21 ml    Labs/Imaging Results for orders placed or performed during the hospital encounter of 06/22/19 (from the past 48 hour(s))  Basic metabolic panel - if new onset seizures     Status: None   Collection Time: 06/22/19  3:05 PM  Result Value Ref Range   Sodium 141 135 - 145 mmol/L   Potassium 4.0 3.5 - 5.1 mmol/L   Chloride 106 98 - 111 mmol/L   CO2 23 22 - 32 mmol/L   Glucose, Bld 99 70 -  99 mg/dL   BUN 15 8 - 23 mg/dL   Creatinine, Ser 0.80 0.44 - 1.00 mg/dL   Calcium 9.1 8.9 - 10.3 mg/dL   GFR calc non Af Amer >60 >60 mL/min   GFR calc Af Amer >60 >60 mL/min   Anion gap 12 5 - 15    Comment: Performed at [facility] [address] [place], [place] [address]  CBC - if new onset seizures     Status: Abnormal   Collection Time: 06/22/19  3:05 PM  Result Value Ref Range   WBC 15.3 (H) 4.0 - 10.5 K/uL   RBC 4.31 3.87 - 5.11 MIL/uL   Hemoglobin 13.4 12.0 - 15.0 g/dL   HCT 41.2 36.0 - 46.0 %   MCV 95.6 80.0 - 100.0 fL   MCH 31.1 26.0 - 34.0 pg   MCHC 32.5 30.0 - 36.0 g/dL   RDW 13.2 11.5 - 15.5 %   Platelets 294 150 - 400 K/uL   nRBC 0.0 0.0 - 0.2 %    Comment: Performed at [facility], [place] [address], [place],  [place] [address]  Protime-INR     Status: None   Collection Time: 06/22/19  3:05 PM  Result Value Ref Range   Prothrombin Time 14.2 11.4 - 15.2 seconds   INR 1.1 0.8 - 1.2    Comment: (NOTE) INR goal varies based on device and disease states. Performed at [facility], [place] [address], [place], [place] [address]   Urinalysis, Routine w reflex microscopic     Status: Abnormal   Collection Time: 06/22/19  6:22 PM  Result Value Ref Range   Color, Urine YELLOW YELLOW   APPearance CLEAR CLEAR   Specific Gravity, Urine 1.011 1.005 - 1.030   pH 5.0 5.0 - 8.0   Glucose, UA NEGATIVE NEGATIVE mg/dL   Hgb urine dipstick NEGATIVE NEGATIVE   Bilirubin Urine NEGATIVE NEGATIVE   Ketones, ur NEGATIVE NEGATIVE mg/dL   Protein, ur NEGATIVE NEGATIVE mg/dL   Nitrite POSITIVE (A) NEGATIVE   Leukocytes,Ua TRACE (A) NEGATIVE   RBC / HPF 0-5 0 - 5 RBC/hpf   WBC, UA 0-5 0 - 5 WBC/hpf   Bacteria, UA MANY (A) NONE SEEN   Mucus PRESENT     Comment: Performed at [facility] [address] [place], [place] [address]   Ct Head Wo Contrast  Result Date: 06/22/2019 CLINICAL DATA:  Seizure EXAM: CT HEAD WITHOUT CONTRAST TECHNIQUE: Contiguous axial images were obtained from the base of the skull through the vertex without intravenous contrast. COMPARISON:  July 20, 2018 FINDINGS: Brain: No evidence of acute territorial infarction, hemorrhage, hydrocephalus,extra-axial collection or mass lesion/mass effect. There is dilatation the ventricles and sulci consistent with age-related atrophy. Low-attenuation changes in the deep white matter consistent with small vessel ischemia. Vascular: No hyperdense vessel or unexpected calcification. Skull: The skull is intact. No fracture or focal lesion identified. Sinuses/Orbits: The visualized paranasal sinuses and mastoid air cells are clear. The orbits and globes intact. Other: None IMPRESSION: No acute intracranial abnormality. Findings consistent with age related  atrophy and chronic small vessel ischemia Electronically Signed   By: Prudencio Pair M.D.   On: 06/22/2019 15:32    Pending Labs Unresulted Labs (From admission, onward)    Start     Ordered   06/22/19 1926  SARS CORONAVIRUS 2 (TAT 6-24 HRS) Nasopharyngeal Nasopharyngeal Swab  (Asymptomatic/Tier 2 Patients Labs)  Once,   STAT  Question Answer Comment  Is this test for diagnosis or screening Screening   Symptomatic for COVID-19 as defined by CDC No   Hospitalized for COVID-19 No   Admitted to ICU for COVID-19 No   Previously tested for COVID-19 No   Resident in a congregate (group) care setting No   Employed in healthcare setting No   Pregnant No      06/22/19 1925   06/22/19 1924  Urine culture  ONCE - STAT,   STAT    Question:  Patient immune status  Answer:  Normal   06/22/19 1923   Signed and Held  Basic metabolic panel  Tomorrow morning,   R     Signed and Held   Signed and Held  CBC  Tomorrow morning,   R     Signed and Held   Signed and Held  TSH  Once,   R     Signed and Held          Vitals/Pain Today's Vitals   06/22/19 2200 06/22/19 2230 06/22/19 2300 06/22/19 2330  BP: 132/73 (!) 144/69 101/87 (!) 147/73  Pulse: (!) 58 70 71 70  Resp: (!) 21 19 20 17   Temp:      TempSrc:      SpO2:  92%  92%    Isolation Precautions No active isolations  Medications Medications  cefTRIAXone (ROCEPHIN) 1 g in sodium chloride 0.9 % 100 mL IVPB (0 g Intravenous Stopped 06/22/19 2113)    Mobility walks with device High fall risk   Focused Assessments Neuro Assessment Handoff:  Swallow screen pass? N/A         Neuro Assessment: Exceptions to WDL Neuro Checks:      Last Documented NIHSS Modified Score:   Has TPA been given? No If patient is a Neuro Trauma and patient is going to OR before floor call report to [place] nurse: [phone] or [phone]     R Recommendations: See Admitting Provider Note  Report given to:   Additional Notes:

## 2019-06-22 NOTE — ED Notes (Signed)
The pt is alert and makes eye contact but does not answer questions asked  The pts husband at bedside reportts that she has been this way  For 1 1./2 years  Since February she has been getting worse  He cares for her at home  Blanket given

## 2019-06-23 ENCOUNTER — Inpatient Hospital Stay (HOSPITAL_COMMUNITY): Payer: PPO

## 2019-06-23 DIAGNOSIS — R569 Unspecified convulsions: Principal | ICD-10-CM

## 2019-06-23 LAB — URINE CULTURE: Special Requests: NORMAL

## 2019-06-23 LAB — BASIC METABOLIC PANEL
Anion gap: 10 (ref 5–15)
BUN: 15 mg/dL (ref 8–23)
CO2: 24 mmol/L (ref 22–32)
Calcium: 8.7 mg/dL — ABNORMAL LOW (ref 8.9–10.3)
Chloride: 107 mmol/L (ref 98–111)
Creatinine, Ser: 0.97 mg/dL (ref 0.44–1.00)
GFR calc Af Amer: >60 mL/min (ref >60)
GFR calc non Af Amer: 54 mL/min — ABNORMAL LOW (ref >60)
Glucose, Bld: 96 mg/dL (ref 70–99)
Potassium: 3.7 mmol/L (ref 3.5–5.1)
Sodium: 141 mmol/L (ref 135–145)

## 2019-06-23 LAB — CBC
HCT: 38.3 % (ref 36.0–46.0)
Hemoglobin: 12.8 g/dL (ref 12.0–15.0)
MCH: 31.4 pg (ref 26.0–34.0)
MCHC: 33.4 g/dL (ref 30.0–36.0)
MCV: 93.9 fL (ref 80.0–100.0)
Platelets: 262 10*3/uL (ref 150–400)
RBC: 4.08 MIL/uL (ref 3.87–5.11)
RDW: 13.2 % (ref 11.5–15.5)
WBC: 8.7 10*3/uL (ref 4.0–10.5)
nRBC: 0 % (ref 0.0–0.2)

## 2019-06-23 LAB — TSH: TSH: 0.961 u[IU]/mL (ref 0.350–4.500)

## 2019-06-23 LAB — SARS CORONAVIRUS 2 (TAT 6-24 HRS): SARS Coronavirus 2: NEGATIVE

## 2019-06-23 MED ORDER — ENOXAPARIN SODIUM 40 MG/0.4ML ~~LOC~~ SOLN
40.0000 mg | Freq: Every day | SUBCUTANEOUS | Status: DC
  Administered 2019-06-23: 40 mg via SUBCUTANEOUS
  Filled 2019-06-23 (×2): qty 0.4

## 2019-06-23 MED ORDER — DIVALPROEX SODIUM 125 MG PO CSDR
250.0000 mg | DELAYED_RELEASE_CAPSULE | Freq: Three times a day (TID) | ORAL | Status: DC
  Administered 2019-06-23 – 2019-06-25 (×6): 250 mg via ORAL
  Filled 2019-06-23 (×6): qty 2

## 2019-06-23 MED ORDER — LEVETIRACETAM IN NACL 1000 MG/100ML IV SOLN
1000.0000 mg | Freq: Once | INTRAVENOUS | Status: AC
  Administered 2019-06-23: 1000 mg via INTRAVENOUS
  Filled 2019-06-23: qty 100

## 2019-06-23 MED ORDER — GADOBUTROL 1 MMOL/ML IV SOLN
5.5000 mL | Freq: Once | INTRAVENOUS | Status: AC | PRN
  Administered 2019-06-23: 5.5 mL via INTRAVENOUS

## 2019-06-23 MED ORDER — HALOPERIDOL LACTATE 5 MG/ML IJ SOLN: 2.0000 mg | Freq: Four times a day (QID) | INTRAMUSCULAR | Status: DC | PRN

## 2019-06-23 MED ORDER — SODIUM CHLORIDE 0.9 % IV SOLN
1.0000 g | INTRAVENOUS | Status: DC
  Administered 2019-06-23 – 2019-06-24 (×2): 1 g via INTRAVENOUS
  Filled 2019-06-23: qty 1
  Filled 2019-06-23: qty 10
  Filled 2019-06-23: qty 1

## 2019-06-23 MED ORDER — ACETAMINOPHEN 650 MG RE SUPP: 650.0000 mg | Freq: Four times a day (QID) | RECTAL | Status: DC | PRN

## 2019-06-23 MED ORDER — ESCITALOPRAM OXALATE 10 MG PO TABS
10.0000 mg | ORAL_TABLET | Freq: Every day | ORAL | Status: DC
  Administered 2019-06-23 – 2019-06-24 (×2): 10 mg via ORAL
  Filled 2019-06-23 (×3): qty 1

## 2019-06-23 MED ORDER — SODIUM CHLORIDE 0.9% FLUSH
3.0000 mL | Freq: Two times a day (BID) | INTRAVENOUS | Status: DC
  Administered 2019-06-23 – 2019-06-24 (×3): 3 mL via INTRAVENOUS

## 2019-06-23 MED ORDER — QUETIAPINE FUMARATE 25 MG PO TABS: 8.0000 mg | ORAL_TABLET | Freq: Every day | ORAL | Status: DC

## 2019-06-23 MED ORDER — DEXTROSE-NACL 5-0.45 % IV SOLN
INTRAVENOUS | Status: DC
  Administered 2019-06-23: 01:00:00 via INTRAVENOUS

## 2019-06-23 MED ORDER — ACETAMINOPHEN 325 MG PO TABS: 650.0000 mg | ORAL_TABLET | Freq: Four times a day (QID) | ORAL | Status: DC | PRN

## 2019-06-23 MED ORDER — SODIUM CHLORIDE 0.9% FLUSH: 3.0000 mL | INTRAVENOUS | Status: DC | PRN

## 2019-06-23 MED ORDER — QUETIAPINE 12.5 MG HALF TABLET
12.5000 mg | ORAL_TABLET | Freq: Every day | ORAL | Status: DC
  Administered 2019-06-23 – 2019-06-24 (×2): 12.5 mg via ORAL
  Filled 2019-06-23 (×3): qty 1

## 2019-06-23 MED ORDER — SODIUM CHLORIDE 0.9 % IV SOLN: 250.0000 mL | INTRAVENOUS | Status: DC | PRN

## 2019-06-23 NOTE — Progress Notes (Signed)
SLP Cancellation Note  Patient Details Name: Kathryn [name] MRN: [id number] DOB: 06-06-1934   Cancelled treatment:       Reason Eval/Treat Not Completed: SLP screened, no needs identified, will sign off   SLP provided screen. Pt's husband present and provides all information. At baseline, pt consumes minced diet (similar to dysphagia 2). Husband provides great detail on aspiration precautions that he imposes to prevent any risk. Pt appears at baseline ability and as such would recommend dysphagia 2 diet. No acute needs identified. ST to sign off.    Keston Seever 06/23/2019, 11:59 AM

## 2019-06-23 NOTE — Progress Notes (Signed)
PROGRESS NOTE    Kathryn [name]  [id number] DOB: 1933-11-17 DOA: 06/22/2019 PCP: System, Pcp Not In    Brief Narrative:  Patient with history of advanced dementia, hypertension anxiety, mostly nonverbal brought to the emergency room with an episode of convulsion and foaming at the mouth evidenced by patient's caretaker and husband at home. Patient always not able to talk. She is always incontinent, however they did not notice any incontinence during the episode. In the emergency room she was alert and makes eye contact. Electrolytes were normal. Urinalysis suspected to have UTI. CT head was normal.   Assessment & Plan:   Active Problems:   HTN (hypertension)   Dementia (HCC)   CKD (chronic kidney disease), stage III (HCC)   Aphasia   HLD (hyperlipidemia)   PAD (peripheral artery disease) (HCC)   Generalized-onset seizures (HCC)   Acute lower UTI   Seizures (HCC)  Convulsions/generalized seizure in a patient with advanced dementia and cerebral atrophy: MRI shows advanced cerebral atrophy. EEG with no evidence of epileptiform focus, has generalized slowing. Case discussed with neurology. With her very advanced cerebral atrophy, this will be treated as structural brain lesion. Loading with Keppra 1 g once Depakote to 50 mg 3 times a day, will need ammonia level and follow-up, will send to her neurologist on discharge. Seizure precautions. Ativan as needed.  Acute UTI present on admission: Started on Rocephin. Continue until final cultures.  Advanced dementia: Very severe symptoms. On dysphagia 2 diet. DNR/DNI. At home with caretakers. Symptomatic treatment. Takes Lexapro in the morning, Seroquel at night that we will continue.   DVT prophylaxis: Lovenox subcu Code Status: DNR/DNI Family Communication: Husband at the bedside Disposition Plan: Home. Anticipate tomorrow if is stable. Work with PT OT.   Consultants:   Neurology, curbside consult  Procedures:   EEG   Antimicrobials:   Rocephin, 06/22/2019---   Subjective: Patient seen and examined. No overnight events. Husband at the bedside. She is awake but confused and not following any commands. Was agitated later in the afternoon.  Objective: Vitals:   06/23/19 0120 06/23/19 0352 06/23/19 0800 06/23/19 1141  BP: (!) 156/71  (!) 150/81 139/78  Pulse: 81  72 80  Resp: 20  12 16   Temp:  98.6 F (37 C) 98.7 F (37.1 C) 98.3 F (36.8 C)  TempSrc:  Oral Oral Oral  SpO2: 90%  95% 92%    Intake/Output Summary (Last 24 hours) at 06/23/2019 1422 Last data filed at 06/23/2019 0600 Gross per 24 hour  Intake 341.87 ml  Output 125 ml  Net 216.87 ml   There were no vitals filed for this visit.  Examination:  General exam: Appears calm and comfortable, pleasantly confused. Unable to follow any commands. Respiratory system: Clear to auscultation. Respiratory effort normal. Cardiovascular system: S1 & S2 heard, RRR. No JVD, murmurs, rubs, gallops or clicks. No pedal edema. Gastrointestinal system: Abdomen is nondistended, soft and nontender. No organomegaly or masses felt. Normal bowel sounds heard. Central nervous system: Alert not confused. Moves all extremities.  Skin: No rashes, lesions or ulcers   Data Reviewed: I have personally reviewed following labs and imaging studies  CBC: Recent Labs  Lab 06/22/19 1505 06/23/19 0521  WBC 15.3* 8.7  HGB 13.4 12.8  HCT 41.2 38.3  MCV 95.6 93.9  PLT 294 [id number]   Basic Metabolic Panel: Recent Labs  Lab 06/22/19 1505 06/23/19 0521  NA 141 141  K 4.0 3.7  CL 106 107  CO2 23 24  GLUCOSE 99 96  BUN 15 15  CREATININE 0.80 0.97  CALCIUM 9.1 8.7*   GFR: CrCl cannot be calculated (Unknown ideal weight.). Liver Function Tests: No results for input(s): AST, ALT, ALKPHOS, BILITOT, PROT, ALBUMIN in the last 168 hours. No results for input(s): LIPASE, AMYLASE in the last 168 hours. No results for input(s): AMMONIA in the last 168 hours.  Coagulation Profile: Recent Labs  Lab 06/22/19 1505  INR 1.1   Cardiac Enzymes: No results for input(s): CKTOTAL, CKMB, CKMBINDEX, TROPONINI in the last 168 hours. BNP (last 3 results) No results for input(s): PROBNP in the last 8760 hours. HbA1C: No results for input(s): HGBA1C in the last 72 hours. CBG: No results for input(s): GLUCAP in the last 168 hours. Lipid Profile: No results for input(s): CHOL, HDL, LDLCALC, TRIG, CHOLHDL, LDLDIRECT in the last 72 hours. Thyroid Function Tests: Recent Labs    06/23/19 0521  TSH 0.961   Anemia Panel: No results for input(s): VITAMINB12, FOLATE, FERRITIN, TIBC, IRON, RETICCTPCT in the last 72 hours. Sepsis Labs: No results for input(s): PROCALCITON, LATICACIDVEN in the last 168 hours.  Recent Results (from the past 240 hour(s))  SARS CORONAVIRUS 2 (TAT 6-24 HRS) Nasopharyngeal Nasopharyngeal Swab     Status: None   Collection Time: 06/22/19  8:34 PM   Specimen: Nasopharyngeal Swab  Result Value Ref Range Status   SARS Coronavirus 2 NEGATIVE NEGATIVE Final    Comment: (NOTE) SARS-CoV-2 target nucleic acids are NOT DETECTED. The SARS-CoV-2 RNA is generally detectable in upper and lower respiratory specimens during the acute phase of infection. Negative results do not preclude SARS-CoV-2 infection, do not rule out co-infections with other pathogens, and should not be used as the sole basis for treatment or other patient management decisions. Negative results must be combined with clinical observations, patient history, and epidemiological information. The expected result is Negative. Fact Sheet for Patients: SugarRoll.be Fact Sheet for Healthcare Providers: https://www.woods-mathews.com/ This test is not yet approved or cleared by the [place] FDA and  has been authorized for detection and/or diagnosis of SARS-CoV-2 by FDA under an Emergency Use Authorization (EUA). This EUA will  remain  in effect (meaning this test can be used) for the duration of the COVID-19 declaration under Section 56 4(b)(1) of the Act, 21 U.S.C. section 360bbb-3(b)(1), unless the authorization is terminated or revoked sooner. Performed at [facility], [place] [address], [place], [place] [address]          Radiology Studies: Ct Head Wo Contrast  Result Date: 06/22/2019 CLINICAL DATA:  Seizure EXAM: CT HEAD WITHOUT CONTRAST TECHNIQUE: Contiguous axial images were obtained from the base of the skull through the vertex without intravenous contrast. COMPARISON:  July 20, 2018 FINDINGS: Brain: No evidence of acute territorial infarction, hemorrhage, hydrocephalus,extra-axial collection or mass lesion/mass effect. There is dilatation the ventricles and sulci consistent with age-related atrophy. Low-attenuation changes in the deep white matter consistent with small vessel ischemia. Vascular: No hyperdense vessel or unexpected calcification. Skull: The skull is intact. No fracture or focal lesion identified. Sinuses/Orbits: The visualized paranasal sinuses and mastoid air cells are clear. The orbits and globes intact. Other: None IMPRESSION: No acute intracranial abnormality. Findings consistent with age related atrophy and chronic small vessel ischemia Electronically Signed   By: Prudencio Pair M.D.   On: 06/22/2019 15:32   Mr Jeri Cos [id number] Contrast  Result Date: 06/23/2019 CLINICAL DATA:  Seizure, new, nontraumatic. Abnormal neuro exam. Dissociated and conversion disorder EXAM: MRI HEAD WITHOUT  AND WITH CONTRAST TECHNIQUE: Multiplanar, multiecho pulse sequences of the brain and surrounding structures were obtained without and with intravenous contrast. CONTRAST:  5.28mL GADAVIST GADOBUTROL 1 MMOL/ML IV SOLN COMPARISON:  Head CT from yesterday FINDINGS: Brain: No acute infarction, hemorrhage, hydrocephalus, extra-axial collection or mass lesion. Advanced brain atrophy especially of the temporal  lobes. Secondary ventriculomegaly. Mild chronic small vessel ischemic type change in the periventricular white matter for age. No cortical finding to explain seizure. Vascular: Normal flow voids. Skull and upper cervical spine: Negative for marrow lesion Sinuses/Orbits: Negative Other: Motion degradation which could easily obscure pathology, best obtainable in this setting. IMPRESSION: 1. No acute finding. 2. Advanced brain atrophy in keeping with history of Alzheimer's dementia. 3. Significant motion degradation. Electronically Signed   By: Monte Fantasia M.D.   On: 06/23/2019 05:57        Scheduled Meds: . divalproex  250 mg Oral Q8H  . enoxaparin (LOVENOX) injection  40 mg Subcutaneous Daily  . escitalopram  10 mg Oral Daily  . QUEtiapine  12.5 mg Oral QHS  . sodium chloride flush  3 mL Intravenous Q12H   Continuous Infusions: . sodium chloride    . cefTRIAXone (ROCEPHIN)  IV       LOS: 1 day    Time spent: 30 minutes    Barb Merino, MD [facility] Pager [phone]

## 2019-06-23 NOTE — Procedures (Signed)
Patient Name: Kathryn [name]  MRN: [id number]  Epilepsy Attending: Lora Havens  Referring Physician/Provider: Dr. Binnie Kand Date: 06/23/2019 Duration: 23.29 minutes  Patient history: 85 year old female with history of dementia presented with seizure-like episode.  EEG to evaluate for seizures.  Level of alertness: Awake  AEDs during EEG study: None  Technical aspects: This EEG study was done with scalp electrodes positioned according to the 10-20 International system of electrode placement. Electrical activity was acquired at a sampling rate of 500Hz  and reviewed with a high frequency filter of 70Hz  and a low frequency filter of 1Hz . EEG data were recorded continuously and digitally stored.   Description: During awake state, no clear posterior dominant rhythm was seen.  EEG showed continuous generalized 2 to 5 Hz theta-delta slowing which at times appeared rhythmic.  Hyperventilation and photic stimulation were not performed.  Abnormality -Continuous slow, generalized  IMPRESSION: This study is suggestive of mild to moderate diffuse encephalopathy, nonspecific etiology. No seizures or epileptiform discharges were seen throughout the recording.     Anthany Thornhill Barbra Sarks

## 2019-06-23 NOTE — Progress Notes (Signed)
EEG complete - results pending 

## 2019-06-24 NOTE — Progress Notes (Signed)
PROGRESS NOTE    Kathryn [name]  IPJ:[id number] DOB: 11-20-1933 DOA: 06/22/2019 PCP: System, Pcp Not In    Brief Narrative:  Patient with history of advanced dementia, hypertension, anxiety, mostly nonverbal brought to the emergency room with an episode of convulsion and foaming at the mouth evidenced by patient's caretaker and husband at home. Patient always not able to talk. She is always incontinent, however they did not notice any incontinence during the episode. In the emergency room, she was alert and makes eye contact. Electrolytes were normal. Urinalysis suspected to have UTI. CT head was normal.   Assessment & Plan:   Active Problems:   HTN (hypertension)   Dementia (HCC)   CKD (chronic kidney disease), stage III (HCC)   Aphasia   HLD (hyperlipidemia)   PAD (peripheral artery disease) (HCC)   Generalized-onset seizures (HCC)   Acute lower UTI   Seizures (HCC)  Convulsions/generalized seizure in a patient with advanced dementia and cerebral atrophy: MRI showed advanced cerebral atrophy. EEG with no evidence of epileptiform focus, has generalized slowing. Case discussed with neurology. With her very advanced cerebral atrophy, this will be treated as structural brain lesion. Loaded with Keppra 1 g once. Depakote to 250 mg 3 times a day, will need ammonia level and follow-up, will send to her neurologist on discharge. Seizure precautions. Ativan as needed.  Acute UTI present on admission: Started on Rocephin.  Urine cultures with multiple organisms.  Will treat with total of 3 days of antibiotics.   Advanced dementia: Very severe symptoms. On dysphagia 2 diet. DNR/DNI. At home with caretakers. Symptomatic treatment. Takes Lexapro in the morning, Seroquel at night that we will continue.  Patient remains in very poor clinical status.  Lives at home.  Met with patient's husband at the bedside to discuss about care. He is well aware that she has very advanced dementia and we are  not hoping additional recovery. Will work with PT OT.  Husband wants to see that she can walk before she can go home with him.  He does have some caregiver support at home. I will also consult palliative medicine. If patient is able to ambulate today, she can be discharged home.  If not able to ambulate, she may need placement or inpatient hospice.  DVT prophylaxis: Lovenox subcu Code Status: DNR/DNI Family Communication: Husband at the bedside Disposition Plan: Pending clinical improvement and mobility.   Consultants:   Neurology, curbside consult  Procedures:   EEG  Antimicrobials:   Rocephin, 06/22/2019---   Subjective: Patient seen and examined.  She had some agitation yesterday.  Last night she got her regular dose of Seroquel and slept all night.  No more agitation.  She is still sleepy.  Husband tells me that she sleeps from 8:00 at night to 10:00 in the morning.  He is very worried about her mobility and taking care at home. Objective: Vitals:   06/23/19 2042 06/24/19 0105 06/24/19 0422 06/24/19 0739  BP: 136/67 (!) 168/87  130/73  Pulse: 85 81 68 65  Resp:  (!) 21 (!) 22 (!) 21  Temp: 97.9 F (36.6 C) 98.5 F (36.9 C) 97.6 F (36.4 C) 98.6 F (37 C)  TempSrc: Oral Axillary Oral Axillary  SpO2:    95%    Intake/Output Summary (Last 24 hours) at 06/24/2019 1126 Last data filed at 06/24/2019 0424 Gross per 24 hour  Intake -  Output 200 ml  Net -200 ml   There were no vitals filed for this  visit.  Examination:  General exam: Appears calm and comfortable, sleepy.  On room air.  Unable to follow any commands. Respiratory system: Clear to auscultation. Respiratory effort normal. Cardiovascular system: S1 & S2 heard, RRR. No JVD, murmurs, rubs, gallops or clicks. No pedal edema. Gastrointestinal system: Abdomen is nondistended, soft and nontender. No organomegaly or masses felt. Normal bowel sounds heard. Central nervous system: Sleepy.  Moves all extremities.  Skin: No rashes, lesions or ulcers   Data Reviewed: I have personally reviewed following labs and imaging studies  CBC: Recent Labs  Lab 06/22/19 1505 06/23/19 0521  WBC 15.3* 8.7  HGB 13.4 12.8  HCT 41.2 38.3  MCV 95.6 93.9  PLT 294 767   Basic Metabolic Panel: Recent Labs  Lab 06/22/19 1505 06/23/19 0521  NA 141 141  K 4.0 3.7  CL 106 107  CO2 23 24  GLUCOSE 99 96  BUN 15 15  CREATININE 0.80 0.97  CALCIUM 9.1 8.7*   GFR: CrCl cannot be calculated (Unknown ideal weight.). Liver Function Tests: No results for input(s): AST, ALT, ALKPHOS, BILITOT, PROT, ALBUMIN in the last 168 hours. No results for input(s): LIPASE, AMYLASE in the last 168 hours. No results for input(s): AMMONIA in the last 168 hours. Coagulation Profile: Recent Labs  Lab 06/22/19 1505  INR 1.1   Cardiac Enzymes: No results for input(s): CKTOTAL, CKMB, CKMBINDEX, TROPONINI in the last 168 hours. BNP (last 3 results) No results for input(s): PROBNP in the last 8760 hours. HbA1C: No results for input(s): HGBA1C in the last 72 hours. CBG: No results for input(s): GLUCAP in the last 168 hours. Lipid Profile: No results for input(s): CHOL, HDL, LDLCALC, TRIG, CHOLHDL, LDLDIRECT in the last 72 hours. Thyroid Function Tests: Recent Labs    06/23/19 0521  TSH 0.961   Anemia Panel: No results for input(s): VITAMINB12, FOLATE, FERRITIN, TIBC, IRON, RETICCTPCT in the last 72 hours. Sepsis Labs: No results for input(s): PROCALCITON, LATICACIDVEN in the last 168 hours.  Recent Results (from the past 240 hour(s))  Urine culture     Status: Abnormal   Collection Time: 06/22/19  1:18 AM   Specimen: Urine, Random  Result Value Ref Range Status   Specimen Description URINE, RANDOM  Final   Special Requests   Final    Normal Performed at [facility], [address], [place], [place] [address]    Culture MULTIPLE SPECIES PRESENT, SUGGEST RECOLLECTION (A)  Final   Report Status 06/23/2019  FINAL  Final  SARS CORONAVIRUS 2 (TAT 6-24 HRS) Nasopharyngeal Nasopharyngeal Swab     Status: None   Collection Time: 06/22/19  8:34 PM   Specimen: Nasopharyngeal Swab  Result Value Ref Range Status   SARS Coronavirus 2 NEGATIVE NEGATIVE Final    Comment: (NOTE) SARS-CoV-2 target nucleic acids are NOT DETECTED. The SARS-CoV-2 RNA is generally detectable in upper and lower respiratory specimens during the acute phase of infection. Negative results do not preclude SARS-CoV-2 infection, do not rule out co-infections with other pathogens, and should not be used as the sole basis for treatment or other patient management decisions. Negative results must be combined with clinical observations, patient history, and epidemiological information. The expected result is Negative. Fact Sheet for Patients: SugarRoll.be Fact Sheet for Healthcare Providers: https://www.woods-mathews.com/ This test is not yet approved or cleared by the [place] FDA and  has been authorized for detection and/or diagnosis of SARS-CoV-2 by FDA under an Emergency Use Authorization (EUA). This EUA will remain  in effect (meaning this test can be used) for the duration of the COVID-19 declaration under Section 56 4(b)(1) of the Act, 21 U.S.C. section 360bbb-3(b)(1), unless the authorization is terminated or revoked sooner. Performed at [facility], [place] [address] [place], [place] [address]          Radiology Studies: Ct Head Wo Contrast  Result Date: 06/22/2019 CLINICAL DATA:  Seizure EXAM: CT HEAD WITHOUT CONTRAST TECHNIQUE: Contiguous axial images were obtained from the base of the skull through the vertex without intravenous contrast. COMPARISON:  July 20, 2018 FINDINGS: Brain: No evidence of acute territorial infarction, hemorrhage, hydrocephalus,extra-axial collection or mass lesion/mass effect. There is dilatation the ventricles and sulci consistent  with age-related atrophy. Low-attenuation changes in the deep white matter consistent with small vessel ischemia. Vascular: No hyperdense vessel or unexpected calcification. Skull: The skull is intact. No fracture or focal lesion identified. Sinuses/Orbits: The visualized paranasal sinuses and mastoid air cells are clear. The orbits and globes intact. Other: None IMPRESSION: No acute intracranial abnormality. Findings consistent with age related atrophy and chronic small vessel ischemia Electronically Signed   By: Prudencio Pair M.D.   On: 06/22/2019 15:32   Mr Jeri Cos SR Contrast  Result Date: 06/23/2019 CLINICAL DATA:  Seizure, new, nontraumatic. Abnormal neuro exam. Dissociated and conversion disorder EXAM: MRI HEAD WITHOUT AND WITH CONTRAST TECHNIQUE: Multiplanar, multiecho pulse sequences of the brain and surrounding structures were obtained without and with intravenous contrast. CONTRAST:  5.22m GADAVIST GADOBUTROL 1 MMOL/ML IV SOLN COMPARISON:  Head CT from yesterday FINDINGS: Brain: No acute infarction, hemorrhage, hydrocephalus, extra-axial collection or mass lesion. Advanced brain atrophy especially of the temporal lobes. Secondary ventriculomegaly. Mild chronic small vessel ischemic type change in the periventricular white matter for age. No cortical finding to explain seizure. Vascular: Normal flow voids. Skull and upper cervical spine: Negative for marrow lesion Sinuses/Orbits: Negative Other: Motion degradation which could easily obscure pathology, best obtainable in this setting. IMPRESSION: 1. No acute finding. 2. Advanced brain atrophy in keeping with history of Alzheimer's dementia. 3. Significant motion degradation. Electronically Signed   By: JMonte FantasiaM.D.   On: 06/23/2019 05:57        Scheduled Meds: . divalproex  250 mg Oral Q8H  . enoxaparin (LOVENOX) injection  40 mg Subcutaneous Daily  . escitalopram  10 mg Oral Daily  . QUEtiapine  12.5 mg Oral QHS  . sodium chloride  flush  3 mL Intravenous Q12H   Continuous Infusions: . sodium chloride    . cefTRIAXone (ROCEPHIN)  IV 1 g (06/23/19 2138)     LOS: 2 days    Time spent: 30 minutes    KBarb Merino MD [facility] Pager [phone]

## 2019-06-24 NOTE — TOC Initial Note (Signed)
Transition of Care (TOC) - Initial/Assessment Note  Marvetta Gibbons RN,BSN Transitions of Care Unit 4NP (non trauma) - RN Case Manager [phone]   Patient Details  Name: Kathryn [name] MRN: [id number] Date of Birth: 19-Jun-1934  Transition of Care [facility]) CM/SW Contact:    Dawayne Patricia, RN Phone Number: 06/24/2019, 11:25 AM  Clinical Narrative:                 Pt with hx advanced dementia from home with spouse. Call made to spouse to discuss care at home- per TC spouse reports he has all needed DME and plans to transport home himself with Lucianne Lei. Pt has current in home care 8hr/day private duty covered under LTC plan- services provided through [facility] for ADLs. Pt also has remote health with [facility] with [facility]. No TOC needs noted- plan to return home with spouse and current home services.   Expected Discharge Plan: Home/Self Care Barriers to Discharge: No Barriers Identified   Patient Goals and CMS Choice Patient states their goals for this hospitalization and ongoing recovery are:: to return home with current care(spouse)   Choice offered to / list presented to : Spouse  Expected Discharge Plan and Services Expected Discharge Plan: Home/Self Care       Living arrangements for the past 2 months: Single Family Home                                      Prior Living Arrangements/Services Living arrangements for the past 2 months: Single Family Home Lives with:: Spouse Patient language and need for interpreter reviewed:: Yes        Need for Family Participation in Patient Care: Yes (Comment) Care giver support system in place?: Yes (comment) Current home services: DME, Homehealth aide(private duty with LTC plan) Criminal Activity/Legal Involvement Pertinent to Current Situation/Hospitalization: No - Comment as needed  Activities of Daily Living      Permission Sought/Granted                  Emotional  Assessment Appearance:: Appears stated age Attitude/Demeanor/Rapport: Unable to Assess(hx dementia) Affect (typically observed): Unable to Assess     Psych Involvement: No (comment)  Admission diagnosis:  Acute cystitis without hematuria [N30.00] Seizure-like activity ([place]) [R56.9] Patient Active Problem List   Diagnosis Date Noted  . Generalized-onset seizures ([place]) 06/22/2019  . Acute lower UTI 06/22/2019  . Seizures ([place]) 06/22/2019  . Pressure injury of skin 10/05/2018  . Hip fracture ([place]) 09/28/2018  . Acute pulmonary edema ([place]) 09/28/2018  . Acute on chronic respiratory failure with hypoxia ([place]) 09/28/2018  . Constipation 09/24/2018  . Generalized osteoarthritis of multiple sites 09/24/2018  . PAD (peripheral artery disease) ([place]) 07/20/2018  . HLD (hyperlipidemia) 06/15/2018  . Unstable gait 02/19/2018  . Aphasia 09/14/2017  . Headache 07/10/2016  . CKD (chronic kidney disease), stage III ([place]) 06/20/2016  . Dementia ([place]) 09/04/2015  . Generalized anxiety disorder 05/29/2015  . HTN (hypertension) 05/29/2015  . Memory loss 05/28/2015   PCP:  System, Pcp Not In Pharmacy:   [id number] - [place], [place]. [address] [place] [address] Phone: [phone] Fax: [phone]     Social Determinants of Health (SDOH) Interventions    Readmission Risk Interventions Readmission Risk Prevention Plan 06/24/2019  Post Dischage Appt Complete  Medication Screening Complete  Transportation Screening Complete  Some recent data might be hidden

## 2019-06-24 NOTE — Evaluation (Signed)
Occupational Therapy Evaluation Patient Details Name: Kathryn [name] MRN: [id number] DOB: [birth year] Today's Date: 06/24/2019    History of Present Illness Pt is an 85 y.o. female with PMH of advanced dementia, hypertension, anxiety, and mostly nonverbal. She was brought to the emergency room with an episode of convulsion and foaming at the mouth evidenced by patient's caretaker and husband at home.   Clinical Impression   Pt PTA: Pt living with spouse with baseline advanced dementia. Pt taken care of by caregivers or spouse with 24/7 care in home; pt dependent for ADL and IADL. Pt mobile with +1-2 assist with no AD. Pt currently minguardA +2 with hand held A for mobility and guiding pt to initiate tasks. Pt continues to be nonverbal mostly, but able to follow most commands. Spouse present for eval; able to inform OT/PT that pt close to her baseline function. Pt does not require continued OT skilled services as pt at her functional baseline with no progress to be made with dementia. OT signing off.      Follow Up Recommendations  No OT follow up;Supervision/Assistance - 24 hour    Equipment Recommendations  None recommended by OT    Recommendations for Other Services       Precautions / Restrictions Precautions Precautions: Fall;Other (comment) Precaution Comments: fall in the past 6 mos; advanced dementia Restrictions Weight Bearing Restrictions: No      Mobility Bed Mobility Overal bed mobility: Needs Assistance Bed Mobility: Supine to Sit     Supine to sit: HOB elevated;+2 for physical assistance;Mod assist     General bed mobility comments: increased assist required due to pt sleeping on entry  Transfers Overall transfer level: Needs assistance Equipment used: 1 person hand held assist;2 person hand held assist Transfers: Sit to/from [facility] Sit to Stand: Min assist;+2 physical assistance Stand pivot transfers: Min assist;+2 physical  assistance       General transfer comment: cueing and physical guidance to initiate to transfer to [facility] and recliner    Balance Overall balance assessment: Needs assistance Sitting-balance support: No upper extremity supported;Feet supported Sitting balance-Leahy Scale: Fair     Standing balance support: Single extremity supported;During functional activity Standing balance-Leahy Scale: Poor Standing balance comment: reliant on external support                           ADL either performed or assessed with clinical judgement   ADL Overall ADL's : At baseline                                       General ADL Comments: Dependent for ADL and IADL due to dementia. Caregivers 8 hours daily through insurance.     Vision Baseline Vision/History: No visual deficits Vision Assessment?: No apparent visual deficits     Perception     Praxis      Pertinent Vitals/Pain Pain Assessment: Faces Faces Pain Scale: No hurt     Hand Dominance Right   Extremity/Trunk Assessment Upper Extremity Assessment Upper Extremity Assessment: Generalized weakness;RUE deficits/detail;LUE deficits/detail RUE Deficits / Details: AROM, WFL below shoulder and fair grip strength LUE Deficits / Details: AROM, WFL below shoulder and fair grip strength   Lower Extremity Assessment Lower Extremity Assessment: Defer to PT evaluation   Cervical / Trunk Assessment Cervical / Trunk Assessment: Kyphotic   Communication Communication Communication: Expressive difficulties  Cognition Arousal/Alertness: Awake/alert Behavior During Therapy: Flat affect Overall Cognitive Status: History of cognitive impairments - at baseline                                 General Comments: h/o advanced dementia, mostly nonverbal. Responds well to simple commands.   General Comments  Spouse present for eval; able to inform OT/PT that pt close to her baseline function     Exercises     Shoulder Instructions      Home Living Family/patient expects to be discharged to:: Private residence Living Arrangements: Spouse/significant other Available Help at Discharge: Family;Available 24 hours/day;Personal care attendant Type of Home: House Home Access: Stairs to enter Entrance Stairs-Number of Steps: 1   Home Layout: Two level;Able to live on main level with bedroom/bathroom Alternate Level Stairs-Number of Steps: 2(grab bar there)   Bathroom Shower/Tub: Occupational psychologist: Standard     Home Equipment: Other (comment);Shower seat;Grab bars - toilet;Grab bars - tub/shower;Hand held shower head;Adaptive equipment(gait belt)          Prior Functioning/Environment Level of Independence: Needs assistance  Gait / Transfers Assistance Needed: wallking with someone with no AD ADL's / Homemaking Assistance Needed: Pt requires near [place] for ADL; spouse/aid provide 24/7 care and perform IADL. Communication / Swallowing Assistance Needed: says Hi and okay          OT Problem List: Decreased activity tolerance      OT Treatment/Interventions:      OT Goals(Current goals can be found in the care plan section) Acute Rehab OT Goals Patient Stated Goal: home per husband  OT Frequency:     Barriers to D/C:            Co-evaluation PT/OT/SLP Co-Evaluation/Treatment: Yes Reason for Co-Treatment: Complexity of the patient's impairments (multi-system involvement) PT goals addressed during session: Mobility/safety with mobility;Balance OT goals addressed during session: ADL's and self-care      AM-PAC OT "6 Clicks" Daily Activity     Outcome Measure Help from another person eating meals?: Total Help from another person taking care of personal grooming?: Total Help from another person toileting, which includes using toliet, bedpan, or urinal?: Total Help from another person bathing (including washing, rinsing, drying)?: Total Help from  another person to put on and taking off regular upper body clothing?: Total Help from another person to put on and taking off regular lower body clothing?: Total 6 Click Score: 6   End of Session Equipment Utilized During Treatment: Gait belt Nurse Communication: Mobility status  Activity Tolerance: Patient tolerated treatment well Patient left: in chair;with call bell/phone within reach;with chair alarm set;with family/visitor present  OT Visit Diagnosis: Unsteadiness on feet (R26.81)                Time: [id number] OT Time Calculation (min): 36 min Charges:  OT General Charges $OT Visit: 1 Visit OT Evaluation $OT Eval Moderate Complexity: 1 Mod  Darryl Nestle) Marsa Aris OTR/L Acute Rehabilitation Services Pager: [phone] Office: [place] 06/24/2019, 3:37 PM

## 2019-06-24 NOTE — Progress Notes (Signed)
Family meeting: I met patient's husband at the bedside.  Patient had worked with PT OT.  She woke up.  Patient has only eaten 1 small cup of food today since morning.  We discussed discharge planning.  Patient's husband still wants to take her home, he will have caretaker help tomorrow morning at home.  Will discharge tomorrow morning with her long-term medications, added Depakote for seizure.  She does have primary care physician and they see her at home. I discussed with patient that she will benefit with home hospice/home palliative care.  We will make a referral for patient to be seen at home with palliative care medicine.  Patient does have end stage dementia.

## 2019-06-24 NOTE — Evaluation (Signed)
Physical Therapy Evaluation Patient Details Name: Kathryn [name] MRN: [id number] DOB: 1933-08-19 Today's Date: 06/24/2019   History of Present Illness  Pt is an 85 y.o. female with PMH of advanced dementia, hypertension, anxiety, and mostly nonverbal. She was brought to the emergency room with an episode of convulsion and foaming at the mouth evidenced by patient's caretaker and husband at home.    Clinical Impression  PT eval complete. Pt required +2 mod assist bed mobility, +1-2 min assist transfers and +1-2 min/min guard assist ambulation 300' HHA. +2 min assist required for ascend/descend 2 steps without rails. Increased assist level needed compared to baseline due to unfamiliar environment. Husband present during session and relays mobility appear at/near baseline. PT to follow acutely to maximize mobility for decreased burden of care upon discharge. No follow up services indicated. Husband reports patient has all needed DME.    Follow Up Recommendations No PT follow up;Supervision/Assistance - 24 hour    Equipment Recommendations  None recommended by PT    Recommendations for Other Services       Precautions / Restrictions Precautions Precautions: Fall;Other (comment) Precaution Comments: fall in the past 6 mos; advanced dementia Restrictions Weight Bearing Restrictions: No      Mobility  Bed Mobility Overal bed mobility: Needs Assistance Bed Mobility: Supine to Sit     Supine to sit: HOB elevated;+2 for physical assistance;Mod assist     General bed mobility comments: increased assist required due to pt sleeping on entry  Transfers Overall transfer level: Needs assistance Equipment used: 1 person hand held assist;2 person hand held assist Transfers: Sit to/from [employer] Transfers Sit to Stand: Min assist;+2 physical assistance Stand pivot transfers: +2 physical assistance;Min assist       General transfer comment: cueing to  initiate  Ambulation/Gait Ambulation/Gait assistance: +2 safety/equipment;Min assist;Min guard Gait Distance (Feet): 300 Feet Assistive device: 1 person hand held assist;2 person hand held assist Gait Pattern/deviations: Decreased stride length;Narrow base of support;Step-through pattern Gait velocity: decreased Gait velocity interpretation: <1.31 ft/sec, indicative of household ambulator General Gait Details: alternating between 1 person and 2 person HHA. More fluid gait pattern with increased distance.  Stairs Stairs: Yes Stairs assistance: Min assist;+2 physical assistance Stair Management: No rails Number of Stairs: 2 General stair comments: cues for sequencing  Wheelchair Mobility    Modified Rankin (Stroke Patients Only)       Balance Overall balance assessment: Needs assistance Sitting-balance support: No upper extremity supported;Feet supported Sitting balance-Leahy Scale: Fair     Standing balance support: Single extremity supported;During functional activity Standing balance-Leahy Scale: Poor Standing balance comment: reliant on external support                             Pertinent Vitals/Pain Pain Assessment: Faces Faces Pain Scale: No hurt    Home Living Family/patient expects to be discharged to:: Private residence Living Arrangements: Spouse/significant other Available Help at Discharge: Family;Available 24 hours/day;Personal care attendant Type of Home: House Home Access: Stairs to enter   Entrance Stairs-Number of Steps: 1 Home Layout: Two level;Able to live on main level with bedroom/bathroom Home Equipment: Other (comment);Shower seat;Grab bars - toilet;Grab bars - tub/shower;Hand held shower head      Prior Function Level of Independence: Needs assistance   Gait / Transfers Assistance Needed: wallking with someone with no AD  ADL's / Homemaking Assistance Needed: Pt requires near [place] for ADL; spouse/aid provide 24/7 care and  perform  IADL.        Hand Dominance   Dominant Hand: Right    Extremity/Trunk Assessment   Upper Extremity Assessment Upper Extremity Assessment: Defer to OT evaluation    Lower Extremity Assessment Lower Extremity Assessment: Overall WFL for tasks assessed    Cervical / Trunk Assessment Cervical / Trunk Assessment: Kyphotic  Communication   Communication: Expressive difficulties  Cognition Arousal/Alertness: Awake/alert Behavior During Therapy: Flat affect Overall Cognitive Status: History of cognitive impairments - at baseline                                 General Comments: h/o advanced dementia, mostly nonverbal. Responds well to simple commands.      General Comments General comments (skin integrity, edema, etc.): Husband present during session. Reports pt mobilizing at/near baseline.    Exercises     Assessment/Plan    PT Assessment Patient needs continued PT services  PT Problem List Decreased mobility;Decreased safety awareness;Decreased activity tolerance;Decreased balance;Decreased cognition       PT Treatment Interventions Therapeutic activities;Gait training;Patient/family education;Stair training;Balance training;Functional mobility training    PT Goals (Current goals can be found in the Care Plan section)  Acute Rehab PT Goals Patient Stated Goal: home per husband PT Goal Formulation: With family Time For Goal Achievement: 07/08/19 Potential to Achieve Goals: Fair    Frequency Min 2X/week   Barriers to discharge        Co-evaluation PT/OT/SLP Co-Evaluation/Treatment: Yes Reason for Co-Treatment: For patient/therapist safety;Complexity of the patient's impairments (multi-system involvement) PT goals addressed during session: Mobility/safety with mobility;Balance OT goals addressed during session: ADL's and self-care       AM-PAC PT "6 Clicks" Mobility  Outcome Measure Help needed turning from your back to your side while  in a flat bed without using bedrails?: A Little Help needed moving from lying on your back to sitting on the side of a flat bed without using bedrails?: A Lot Help needed moving to and from a bed to a chair (including a wheelchair)?: A Little Help needed standing up from a chair using your arms (e.g., wheelchair or bedside chair)?: A Little Help needed to walk in hospital room?: A Little Help needed climbing 3-5 steps with a railing? : A Little 6 Click Score: 17    End of Session Equipment Utilized During Treatment: Gait belt Activity Tolerance: Patient tolerated treatment well Patient left: in bed;with call bell/phone within reach;with bed alarm set;with family/visitor present Nurse Communication: Mobility status PT Visit Diagnosis: Difficulty in walking, not elsewhere classified (R26.2)    Time: [id number] PT Time Calculation (min) (ACUTE ONLY): 43 min   Charges:   PT Evaluation $PT Eval Low Complexity: 1 Low PT Treatments $Gait Training: 8-22 mins        Lorrin Goodell, PT  Office # [phone] Pager [phone]   Lorriane Shire 06/24/2019, 1:31 PM

## 2019-06-25 DIAGNOSIS — Z515 Encounter for palliative care: Secondary | ICD-10-CM

## 2019-06-25 MED ORDER — QUETIAPINE FUMARATE 25 MG PO TABS: 12.5000 mg | ORAL_TABLET | Freq: Every day | ORAL | 0 refills | Status: AC

## 2019-06-25 MED ORDER — LORAZEPAM 2 MG/ML PO CONC: 1.0000 mg | Freq: Every day | ORAL | 0 refills | Status: AC | PRN

## 2019-06-25 MED ORDER — DIVALPROEX SODIUM 125 MG PO CSDR: 250.0000 mg | DELAYED_RELEASE_CAPSULE | Freq: Three times a day (TID) | ORAL | 0 refills | Status: AC

## 2019-06-25 NOTE — Progress Notes (Signed)
CSW contacted [facility] regarding current services provided and if patient is still enrolled. CSW was informed by [facility] they will need to follow up and CSW provided contact information. CSW will update care team once follow-up occurs.   Daphine Deutscher, LCSW, [place] [facility], [place], [place] [phone]

## 2019-06-25 NOTE — Discharge Instructions (Signed)
Seizure, Adult A seizure is a sudden burst of abnormal electrical activity in the brain. Seizures usually last from 30 seconds to 2 minutes. They can cause many different symptoms. Usually, seizures are not harmful unless they last a long time. What are the causes? Common causes of this condition include:  Fever or infection.  Conditions that affect the brain, such as: ? A brain abnormality that you were born with. ? A brain or head injury. ? Bleeding in the brain. ? A tumor. ? Stroke. ? Brain disorders such as autism or cerebral palsy.  Low blood sugar.  Conditions that are passed from parent to child (are inherited).  Problems with substances, such as: ? Having a reaction to a drug or a medicine. ? Suddenly stopping the use of a substance (withdrawal). In some cases, the cause may not be known. A person who has repeated seizures over time without a clear cause has a condition called epilepsy. What increases the risk? You are more likely to get this condition if you have:  A family history of epilepsy.  Had a seizure in the past.  A brain disorder.  A history of head injury, lack of oxygen at birth, or strokes. What are the signs or symptoms? There are many types of seizures. The symptoms vary depending on the type of seizure you have. Examples of symptoms during a seizure include:  Shaking (convulsions).  Stiffness in the body.  Passing out (losing consciousness).  Head nodding.  Staring.  Not responding to sound or touch.  Loss of bladder control and bowel control. Some people have symptoms right before and right after a seizure happens. Symptoms before a seizure may include:  Fear.  Worry (anxiety).  Feeling like you may vomit (nauseous).  Feeling like the room is spinning (vertigo).  Feeling like you saw or heard something before (dj vu).  Odd tastes or smells.  Changes in how you see. You may see flashing lights or spots. Symptoms after a  seizure happens can include:  Confusion.  Sleepiness.  Headache.  Weakness on one side of the body. How is this treated? Most seizures will stop on their own in under 5 minutes. In these cases, no treatment is needed. Seizures that last longer than 5 minutes will usually need treatment. Treatment can include:  Medicines given through an IV tube.  Avoiding things that are known to cause your seizures. These can include medicines that you take for another condition.  Medicines to treat epilepsy.  Surgery to stop the seizures. This may be needed if medicines do not help. Follow these instructions at home: Medicines  Take over-the-counter and prescription medicines only as told by your doctor.  Do not eat or drink anything that may keep your medicine from working, such as alcohol. Activity  Do not do any activities that would be dangerous if you had another seizure, like driving or swimming. Wait until your doctor says it is safe for you to do them.  If you live in the [place] ask your local DMV (department of motor vehicles) when you can drive.  Get plenty of rest. Teaching others Teach friends and family what to do when you have a seizure. They should:  Lay you on the ground.  Protect your head and body.  Loosen any tight clothing around your neck.  Turn you on your side.  Not hold you down.  Not put anything into your mouth.  Know whether or not you need emergency care.  Stay   with you until you are better.  General instructions  Contact your doctor each time you have a seizure.  Avoid anything that gives you seizures.  Keep a seizure diary. Write down: ? What you think caused each seizure. ? What you remember about each seizure.  Keep all follow-up visits as told by your doctor. This is important. Contact a doctor if:  You have another seizure.  You have seizures more often.  There is any change in what happens during your seizures.  You keep having  seizures with treatment.  You have symptoms of being sick or having an infection. Get help right away if:  You have a seizure that: ? Lasts longer than 5 minutes. ? Is different than seizures you had before. ? Makes it harder to breathe. ? Happens after you hurt your head.  You have any of these symptoms after a seizure: ? Not being able to speak. ? Not being able to use a part of your body. ? Confusion. ? A bad headache.  You have two or more seizures in a row.  You do not wake up right after a seizure.  You get hurt during a seizure. These symptoms may be an emergency. Do not wait to see if the symptoms will go away. Get medical help right away. Call your local emergency services (911 in the [place] Do not drive yourself to the hospital. Summary  Seizures usually last from 30 seconds to 2 minutes. Usually, they are not harmful unless they last a long time.  Do not eat or drink anything that may keep your medicine from working, such as alcohol.  Teach friends and family what to do when you have a seizure.  Contact your doctor each time you have a seizure. This information is not intended to replace advice given to you by your health care provider. Make sure you discuss any questions you have with your health care provider. Document Released: 01/07/2008 Document Revised: 10/08/2018 Document Reviewed: 10/08/2018 [employer] Patient Education  2020 [employer]  

## 2019-06-25 NOTE — Discharge Summary (Addendum)
Discharge Summary  Kathryn [name] [id number] DOB: 1934/02/10  PCP: System, Pcp Not In  Admit date: 06/22/2019 Discharge date: 06/25/2019  Time spent: 35 minutes  Recommendations for Outpatient Follow-up:  1. Follow-up with neurology 2. Follow-up with your primary care provider 3. Take your medications as prescribed 4. Do not drive or operate heavy machinery/take bath alone, swim alone for the next 6 months or until cleared by neurology.  Discharge Diagnoses:  Active Hospital Problems   Diagnosis Date Noted   Generalized-onset seizures ([place]) 06/22/2019   Acute lower UTI 06/22/2019   Seizures ([place]) 06/22/2019   PAD (peripheral artery disease) ([place]) 07/20/2018   HLD (hyperlipidemia) 06/15/2018   Aphasia 09/14/2017   CKD (chronic kidney disease), stage III ([place]) 06/20/2016   Dementia ([place]) 09/04/2015   HTN (hypertension) 05/29/2015    Resolved Hospital Problems  No resolved problems to display.    Discharge Condition: Stable  Diet recommendation: Resume previous diet  Vitals:   06/25/19 0407 06/25/19 0753  BP: (!) 134/56 (!) 120/55  Pulse: 71 73  Resp: 19 18  Temp: 97.8 F (36.6 C) 98.1 F (36.7 C)  SpO2: 95% 96%    History of present illness:   Patient with history of advanced dementia, hypertension, anxiety, mostly nonverbal brought to the emergency room with an episode of convulsion and foaming at the mouth evidenced by patient's caretaker and husband at home. Patient always not able to talk. She is always incontinent, however they did not notice any incontinence during the episode. In the emergency room, she was alert and makes eye contact. Electrolytes were normal. Urinalysis suspected to have UTI. CT head was normal.  06/25/19: Patient was seen and examined at her bedside this morning.  She is somnolent but arousable.  Vital signs stable.  Discussed with her husband via phone who wants her to come home.  Stating he has home health services and care  set up at home.   Hospital Course:  Active Problems:   HTN (hypertension)   Dementia (HCC)   CKD (chronic kidney disease), stage III (HCC)   Aphasia   HLD (hyperlipidemia)   PAD (peripheral artery disease) (H[facility])   Generalized-onset seizures (HCC)   Acute lower UTI   Seizures (HCC)  Newly diagnosed Convulsions/generalized seizure in a patient with advanced dementia and cerebral atrophy: MRI showed advanced cerebral atrophy. EEG with no evidence of epileptiform focus, has generalized slowing. Case discussed with neurology. With her very advanced cerebral atrophy, this will be treated as structural brain lesion. Loaded with Keppra 1 g once. Depakote to 250 mg 3 times a day, will need ammonia level and follow-up, will send to her neurologist on discharge. Continue seizure precautions at home. Do not drive or operate heavy machinery/ do not take bath or swim alone until cleared by neurology If seizure recurs call 911 and give 1 mg liquid ativan once Follow-up with neurology outpatient- please call to make an appointment.  Presumptive UTI present on admission: Started on Rocephin.  Urine cultures with multiple organisms.  Will treat with total of 3 days of antibiotics.    Completed Rocephin on 06/25/2019.  Advanced dementia: Very severe symptoms.  Continue dysphagia 2 diet. DNR/DNI.  Husband prefers discharge to home.  States he has home health services and care set up at home.  Takes Lexapro in the morning, Seroquel at night that we will continue.  Physical debility/ambulatory dysfunction Evaluated by PT OT with no PT or OT follow-up and recommendation for 24-hour supervision/assistance.  Continue  fall precautions  Goals of care: Patient remains in very poor clinical status.  Lives at home.    Discussed with husband via phone with plan of care.  He prefers that she comes home.  Ongoing discussion with palliative care team.    DVT prophylaxis: Lovenox subcu Code Status:  DNR/DNI Family Communication: Husband at the bedside Disposition Plan: Pending clinical improvement and mobility.   Consultants:   Neurology, curbside consult  Procedures:   EEG  Antimicrobials:   Rocephin, 06/22/2019---   Discharge Exam: BP (!) 120/55 (BP Location: Right Arm)    Pulse 73    Temp 98.1 F (36.7 C) (Oral)    Resp 18    SpO2 96%   General: 85 y.o. year-old female well developed well nourished in no acute distress.  Somnolent but arousable.  Cardiovascular: Regular rate and rhythm with no rubs or gallops.  No thyromegaly or JVD noted.    Respiratory: Clear to auscultation with no wheezes or rales. Good inspiratory effort.  Abdomen: Soft nontender nondistended with normal bowel sounds x4 quadrants.  Musculoskeletal: No lower extremity edema. 2/4 pulses in all 4 extremities.  Psychiatry: Unable to assess mood due to somnolence.   Discharge Instructions You were cared for by a hospitalist during your hospital stay. If you have any questions about your discharge medications or the care you received while you were in the hospital after you are discharged, you can call the unit and asked to speak with the hospitalist on call if the hospitalist that took care of you is not available. Once you are discharged, your primary care physician will handle any further medical issues. Please note that NO REFILLS for any discharge medications will be authorized once you are discharged, as it is imperative that you return to your primary care physician (or establish a relationship with a primary care physician if you do not have one) for your aftercare needs so that they can reassess your need for medications and monitor your lab values.   Allergies as of 06/25/2019      Reactions   Aricept [donepezil Hcl] Other (See Comments)   "giving stomach and digestive problems"   Sulfonamide Derivatives    REACTION: Swollen joints      Medication List    STOP taking these  medications   polyethylene glycol 17 g packet Commonly known as: MIRALAX / GLYCOLAX   Zinc Oxide 16 % Oint Commonly known as: Boudreauxs Butt Paste     TAKE these medications   COQ10 PO Take 50 mg by mouth daily.   divalproex 125 MG capsule Commonly known as: DEPAKOTE SPRINKLE Take 2 capsules (250 mg total) by mouth every 8 (eight) hours.   escitalopram 10 MG tablet Commonly known as: LEXAPRO Take 10 mg by mouth daily.   folic acid A999333 MCG tablet Commonly known as: FOLVITE Take 400 mcg by mouth daily.   LORazepam 2 MG/ML concentrated solution Commonly known as: ATIVAN Take 0.5 mLs (1 mg total) by mouth daily as needed for up to 5 days for seizure.   pyridoxine 100 MG tablet Commonly known as: B-6 Take 100 mg by mouth daily.   QUEtiapine 25 MG tablet Commonly known as: SEROQUEL Take 0.5 tablets (12.5 mg total) by mouth at bedtime. What changed:   how much to take  additional instructions   senna-docusate 8.6-50 MG tablet Commonly known as: Senokot-S Take 1 tablet by mouth at bedtime as needed for mild constipation.   Vitamin D3 50 MCG (2000 UT)  Tabs Take 2,000 Units by mouth daily.   zinc gluconate 50 MG tablet Take 50 mg by mouth daily.      Allergies  Allergen Reactions   Aricept [Donepezil Hcl] Other (See Comments)    "giving stomach and digestive problems"   Sulfonamide Derivatives     REACTION: Swollen joints   Follow-up Information    [facility]. Call in 1 day(s).   Why: Please call for a post hospital follow-up appointment. Contact information: [address]     [place] [phone]           The results of significant diagnostics from this hospitalization (including imaging, microbiology, ancillary and laboratory) are listed below for reference.    Significant Diagnostic Studies: Ct Head Wo Contrast  Result Date: 06/22/2019 CLINICAL DATA:  Seizure EXAM: CT HEAD  WITHOUT CONTRAST TECHNIQUE: Contiguous axial images were obtained from the base of the skull through the vertex without intravenous contrast. COMPARISON:  July 20, 2018 FINDINGS: Brain: No evidence of acute territorial infarction, hemorrhage, hydrocephalus,extra-axial collection or mass lesion/mass effect. There is dilatation the ventricles and sulci consistent with age-related atrophy. Low-attenuation changes in the deep white matter consistent with small vessel ischemia. Vascular: No hyperdense vessel or unexpected calcification. Skull: The skull is intact. No fracture or focal lesion identified. Sinuses/Orbits: The visualized paranasal sinuses and mastoid air cells are clear. The orbits and globes intact. Other: None IMPRESSION: No acute intracranial abnormality. Findings consistent with age related atrophy and chronic small vessel ischemia Electronically Signed   By: Prudencio Pair M.D.   On: 06/22/2019 15:32   Mr Jeri Cos [id number] Contrast  Result Date: 06/23/2019 CLINICAL DATA:  Seizure, new, nontraumatic. Abnormal neuro exam. Dissociated and conversion disorder EXAM: MRI HEAD WITHOUT AND WITH CONTRAST TECHNIQUE: Multiplanar, multiecho pulse sequences of the brain and surrounding structures were obtained without and with intravenous contrast. CONTRAST:  5.61mL GADAVIST GADOBUTROL 1 MMOL/ML IV SOLN COMPARISON:  Head CT from yesterday FINDINGS: Brain: No acute infarction, hemorrhage, hydrocephalus, extra-axial collection or mass lesion. Advanced brain atrophy especially of the temporal lobes. Secondary ventriculomegaly. Mild chronic small vessel ischemic type change in the periventricular white matter for age. No cortical finding to explain seizure. Vascular: Normal flow voids. Skull and upper cervical spine: Negative for marrow lesion Sinuses/Orbits: Negative Other: Motion degradation which could easily obscure pathology, best obtainable in this setting. IMPRESSION: 1. No acute finding. 2. Advanced brain atrophy  in keeping with history of Alzheimer's dementia. 3. Significant motion degradation. Electronically Signed   By: Monte Fantasia M.D.   On: 06/23/2019 05:57    Microbiology: Recent Results (from the past 240 hour(s))  Urine culture     Status: Abnormal   Collection Time: 06/22/19  1:18 AM   Specimen: Urine, Random  Result Value Ref Range Status   Specimen Description URINE, RANDOM  Final   Special Requests   Final    Normal Performed at [facility], [address], [place], [place] [address]    Culture MULTIPLE SPECIES PRESENT, SUGGEST RECOLLECTION (A)  Final   Report Status 06/23/2019 FINAL  Final  SARS CORONAVIRUS 2 (TAT 6-24 HRS) Nasopharyngeal Nasopharyngeal Swab     Status: None   Collection Time: 06/22/19  8:34 PM   Specimen: Nasopharyngeal Swab  Result Value Ref Range Status   SARS Coronavirus 2 NEGATIVE NEGATIVE Final    Comment: (NOTE) SARS-CoV-2 target nucleic acids are NOT DETECTED. The SARS-CoV-2 RNA is generally detectable in upper  and lower respiratory specimens during the acute phase of infection. Negative results do not preclude SARS-CoV-2 infection, do not rule out co-infections with other pathogens, and should not be used as the sole basis for treatment or other patient management decisions. Negative results must be combined with clinical observations, patient history, and epidemiological information. The expected result is Negative. Fact Sheet for Patients: SugarRoll.be Fact Sheet for Healthcare Providers: https://www.woods-mathews.com/ This test is not yet approved or cleared by the [place] FDA and  has been authorized for detection and/or diagnosis of SARS-CoV-2 by FDA under an Emergency Use Authorization (EUA). This EUA will remain  in effect (meaning this test can be used) for the duration of the COVID-19 declaration under Section 56 4(b)(1) of the Act, 21 U.S.C. section 360bbb-3(b)(1), unless the  authorization is terminated or revoked sooner. Performed at [facility], [place] [address], [place], [place] [address]      Labs: Basic Metabolic Panel: Recent Labs  Lab 06/22/19 1505 06/23/19 0521  NA 141 141  K 4.0 3.7  CL 106 107  CO2 23 24  GLUCOSE 99 96  BUN 15 15  CREATININE 0.80 0.97  CALCIUM 9.1 8.7*   Liver Function Tests: No results for input(s): AST, ALT, ALKPHOS, BILITOT, PROT, ALBUMIN in the last 168 hours. No results for input(s): LIPASE, AMYLASE in the last 168 hours. No results for input(s): AMMONIA in the last 168 hours. CBC: Recent Labs  Lab 06/22/19 1505 06/23/19 0521  WBC 15.3* 8.7  HGB 13.4 12.8  HCT 41.2 38.3  MCV 95.6 93.9  PLT 294 262   Cardiac Enzymes: No results for input(s): CKTOTAL, CKMB, CKMBINDEX, TROPONINI in the last 168 hours. BNP: BNP (last 3 results) Recent Labs    09/28/18 1714  BNP 158.5*    ProBNP (last 3 results) No results for input(s): PROBNP in the last 8760 hours.  CBG: No results for input(s): GLUCAP in the last 168 hours.     Signed:  Kayleen Memos, MD [facility] 06/25/2019, 9:39 AM

## 2019-06-25 NOTE — Consult Note (Signed)
Consultation Note Date: 06/25/2019   Patient Name: Kathryn [name]  DOB: 29-Sep-1933  MRN: [id number]  Age / Sex: 85 y.o., female  PCP: System, Pcp Not In Referring Physician: Barb Merino, MD  Reason for Consultation: Hospice Evaluation and Psychosocial/spiritual support  HPI/Patient Profile: 85 y.o. female  with past medical history of alzheimer's dementia, UTIs, CKD 3, incontinence, and PAD who was admitted on 06/22/2019 with generalized seizures.   Clinical Assessment and Goals of Care:  I have reviewed medical records including EPIC notes, labs and imaging, received report from the care team, assessed the patient and then met at the bedside along with her husband [name]  to discuss diagnosis prognosis, [place], EOL wishes, disposition and options.  I introduced Palliative Medicine as specialized medical care for people living with serious illness. It focuses on providing relief from the symptoms and stress of a serious illness. The goal is to improve quality of life for both the patient and the family.  [name] is Mrs. Fonseca's 24 hour care taker.  He has an aide 8 hours a day thru [employer].  He has [facility]" thru his insurance company that sends RNs to the house every two weeks.  He would very much like to keep her at home rather than in a nursing facility.  The Komar's have 1 daughter who works in Corporate treasurer.  As far as functional and nutritional status she eats well but has to be fed.  She is unable to feed herself.  She is able to walk with assistance.  She rarely speaks.  Per Mr. Frese the patient sleeps from 8 pm to 10 am most nights.  We discussed her current illness and what it means in the larger context of her on-going co-morbidities.  Natural disease trajectory and expectations at EOL were discussed.  Mr. Rote understands that seizures are an uncommon symptom of advancing alzheimer's  dementia.  Initially he was concerned about the Depakote as he feared it made her agitated, but when he realized she had had 6 doses of it and was calm, he felt better.   He expressed a concern, "What happen's if we go home and she has another seizure?".  We discussed having an emergency dose of liquid ativan to give to his wife in an eyedropper under her tongue - while he calls 911.  I attempted to elicit values and goals of care important to the patient.  Mrs. Teel's comfort and quality of life is most important to Mr. Jobst.  She is a DNR.  Mr. Lueth is firm there will never be a feeding tube.  He wants her given the best care possible, but does not want her life artificially extended.  Hospice and Palliative Care services outpatient were explained and offered.  Unfortunately Mrs. Krogh does not yet qualify for hospice outpatient under the diagnosis of dementia.  She is able to ambulate and is eating well (albumin 3.6). However, should she have refractory seizures or recurrent hospitalizations (for any reason) she will likely qualify very soon.  I explained this to Mr. Otterson.  He is familiar with Hospice from other family members and would welcome their support when she qualifies.  Questions and concerns were addressed.   The family was encouraged to call with questions or concerns.    Primary Decision Maker:  NEXT OF KIN Husband Charles.      SUMMARY OF RECOMMENDATIONS     Patient is being monitored by [facility] (RNs and MDs) thru their insurance company.    Husband feels this covers Palliative needs.  Patient will likely qualify for Hospice very soon - next hospitalization or when ambulation decreases and or eating decreases.  Would recommend a small amount of liquid lorazepam be sent home with patient in order for husband to give it ONLY if she has a seizure.  Code Status/Advance Care Planning:  DNR/DNI  No feeding tube.   Symptom Management:   Per primary team.  Patient was  recently started on Seroquel for agitation (2 weeks prior to hospitalization)  Palliative Prophylaxis:   Aspiration and Delirium Protocol  Psycho-social/Spiritual:   Desire for further Chaplaincy support:  Welcomed   Prognosis:   Less than 1 year given advanced dementia recurrent UTIs and now seizures.   Discharge Planning: Home with Home Health      Primary Diagnoses: Present on Admission: . HTN (hypertension) . Dementia ([place]) . CKD (chronic kidney disease), stage III ([place]) . Aphasia . HLD (hyperlipidemia) . PAD (peripheral artery disease) ([place]) . Acute lower UTI   I have reviewed the medical record, interviewed the patient and family, and examined the patient. The following aspects are pertinent.  Past Medical History:  Diagnosis Date  . Alzheimer's dementia ([place])   . Anxiety   . Headache   . Hypertension    Social History   Socioeconomic History  . Marital status: Married    Spouse name: [name]   . Number of children: 3  . Years of education: 23  . Highest education level: Not on file  Occupational History  . Not on file  Social Needs  . Financial resource strain: Not on file  . Food insecurity    Worry: Not on file    Inability: Not on file  . Transportation needs    Medical: Not on file    Non-medical: Not on file  Tobacco Use  . Smoking status: Former Smoker    Quit date: 1980    Years since quitting: 40.9  . Smokeless tobacco: Never Used  Substance and Sexual Activity  . Alcohol use: Not Currently    Alcohol/week: 7.0 - 14.0 standard drinks    Types: 7 - 14 Glasses of wine per week    Comment: white wine; update 01/24/19 pt quit drinking alcohol completely  . Drug use: No  . Sexual activity: Not on file  Lifestyle  . Physical activity    Days per week: Not on file    Minutes per session: Not on file  . Stress: Not on file  Relationships  . Social Herbalist on phone: Not on file    Gets together: Not on file    Attends  religious service: Not on file    Active member of club or organization: Not on file    Attends meetings of clubs or organizations: Not on file    Relationship status: Not on file  Other Topics Concern  . Not on file  Social History Narrative   Lives at home with husband   Caffeine  use: Stopped drinking coffee, drinks maybe 8 oz of unsweet tea daily   Right handed   Goes to [address] [facility] day care for 4 hours twice weekly (update has not been there since 09/28/2018 when she broke her hip)   Family History  Problem Relation Age of Onset  . Thyroid disease Mother   . Alzheimer's disease Mother   . Dementia Mother   . Hearing loss Brother   . Hearing loss Brother        also developing memory problems  . Heart Problems Brother    Scheduled Meds: . divalproex  250 mg Oral Q8H  . enoxaparin (LOVENOX) injection  40 mg Subcutaneous Daily  . escitalopram  10 mg Oral Daily  . QUEtiapine  12.5 mg Oral QHS  . sodium chloride flush  3 mL Intravenous Q12H   Continuous Infusions: . sodium chloride    . cefTRIAXone (ROCEPHIN)  IV 1 g (06/24/19 1944)   PRN Meds:.sodium chloride, acetaminophen **OR** acetaminophen, haloperidol lactate, sodium chloride flush Allergies  Allergen Reactions  . Aricept [Donepezil Hcl] Other (See Comments)    "giving stomach and digestive problems"  . Sulfonamide Derivatives     REACTION: Swollen joints   Review of Systems patient awake but not speaking  Physical Exam  Well developed female, calm, non-verbal  CV rrr, no m/r/g Resp CTA no w/c/r Abdomen soft, nt, nd, +Bs LE no edema.  Vital Signs: BP (!) 120/55 (BP Location: Right Arm)   Pulse 73   Temp 98.1 F (36.7 C) (Oral)   Resp 18   SpO2 96%  Pain Scale: PAINAD POSS *See Group Information*: 1-Acceptable,Awake and alert     SpO2: SpO2: 96 % O2 Device:SpO2: 96 % O2 Flow Rate: .   IO: Intake/output summary:   Intake/Output Summary (Last 24 hours) at 06/25/2019 0910 Last data filed at  06/25/2019 4801 Gross per 24 hour  Intake 223 ml  Output 200 ml  Net 23 ml    LBM:   Baseline Weight:   Most recent weight:       Palliative Assessment/Data: 50%     Time In: 9:30 Time Out: 10:20 Time Total: 50 min. Visit consisted of counseling and education dealing with the complex and emotionally intense issues surrounding the need for palliative care and symptom management in the setting of serious and potentially life-threatening illness. Greater than 50%  of this time was spent counseling and coordinating care related to the above assessment and plan.  Signed by: Florentina Jenny, PA-C Palliative Medicine Pager: [phone]  Please contact Palliative Medicine Team phone at [phone] for questions and concerns.  For individual provider: See Shea Evans

## 2019-06-25 NOTE — TOC Transition Note (Signed)
Transition of Care [facility]) - CM/SW Discharge Note   Patient Details  Name: Kathryn [name] MRN: [id number] Date of Birth: August 30, 1933  [facility] CM/SW Contact:  Claudie Leach, RN Phone Number: [phone] 06/25/2019, 11:08 AM   Clinical Narrative:    Discussed dc plan with husband due to continued questions regarding palliative or home health services.  He states their private caregivers will meet them at home upon arrival.  He states patient receives nursing/medical care through insurance from "[facility]". Husband states he understands what palliative/hospice services entail and does not need those services at the present time.  He states she is not receiving therapy and does not think it's necessary since she can walk.  PT/OT did not recommend HH.    Husband relays that he is ready to bring her home and does not need further services or phone calls regarding available services.      Final next level of care: Home/Self Care Barriers to Discharge: No Barriers Identified   Patient Goals and CMS Choice Patient states their goals for this hospitalization and ongoing recovery are:: to return home with current care(spouse)   Choice offered to / list presented to : Spouse   Readmission Risk Interventions Readmission Risk Prevention Plan 06/24/2019  Post Dischage Appt Complete  Medication Screening Complete  Transportation Screening Complete  Some recent data might be hidden

## 2019-06-27 ENCOUNTER — Telehealth: Payer: Self-pay | Admitting: Neurology

## 2019-06-27 NOTE — Telephone Encounter (Signed)
Pts husband called in and stated pt had a seizure and is currently admitted to the hospital and he would like her chart reviewed to make sure everything is going ok

## 2019-06-27 NOTE — Telephone Encounter (Signed)
I spoke with the patient's husband [name] (on [place]). He stated the pt was d/c on Saturday. He stated he was told to call us and this an FYI. He said the pt is difficult to arouse and he cannot get her take anything orally. He said the pt does wake up briefly. He reports she is in a medicine induced stupor. He also reported the pt has been the same since discharge and she was seen by a nurse today at 1:30. He stated the pt is being seen by an NP and MD from [facility]. He does not feel an appt with Dr. [name] is needed at this time, but I encouraged him to please let us know if he changes his mind. I asked him to call us back if needed. I advised Dr. [name] is currently out of the office but work-in MD available. He verbalized appreciation.

## 2019-06-28 ENCOUNTER — Telehealth: Payer: Self-pay | Admitting: *Deleted

## 2019-06-28 NOTE — Telephone Encounter (Signed)
Transition Care Management Follow-up Telephone Call   Date discharged? 11.21.2020   How have you been since you were released from the hospital? "She has been real drowsy cause of her med but we got them adjusted"    Do you understand why you were in the hospital? Per husband he understood why    Do you understand the discharge instructions? yes   Where were you discharged to? Home    Items Reviewed:  Medications reviewed: yes  Allergies reviewed: yes  Dietary changes reviewed: no  Referrals reviewed: N/A    Functional Questionnaire:   Activities of Daily Living (ADLs):   She states they are independent in the following: None at the moment  States they require assistance with the following: ambulation, bathing and hygiene, feeding, continence, grooming, toileting and dressing   Any transportation issues/concerns?: no   Any patient concerns? no   Confirmed importance and date/time of follow-up visits scheduled yes  Provider Appointment booked with Per husband does not want to make appt. Patient has [facility] who is supposed to be communicating with PCP.   Confirmed with patient if condition begins to worsen call PCP or go to the ER.  Patient was given the office number and encouraged to call back with question or concerns.  : yes

## 2019-07-08 ENCOUNTER — Other Ambulatory Visit: Payer: Self-pay | Admitting: Neurology

## 2019-07-10 IMAGING — CR DG HAND COMPLETE 3+V*R*
3 series · 3 of 3 positions shown · non-contrast
Comparison: None.

CLINICAL DATA: Fell from bed today with laceration of the hand.

EXAM:
RIGHT HAND - COMPLETE 3+ VIEW

[x hand pa right]
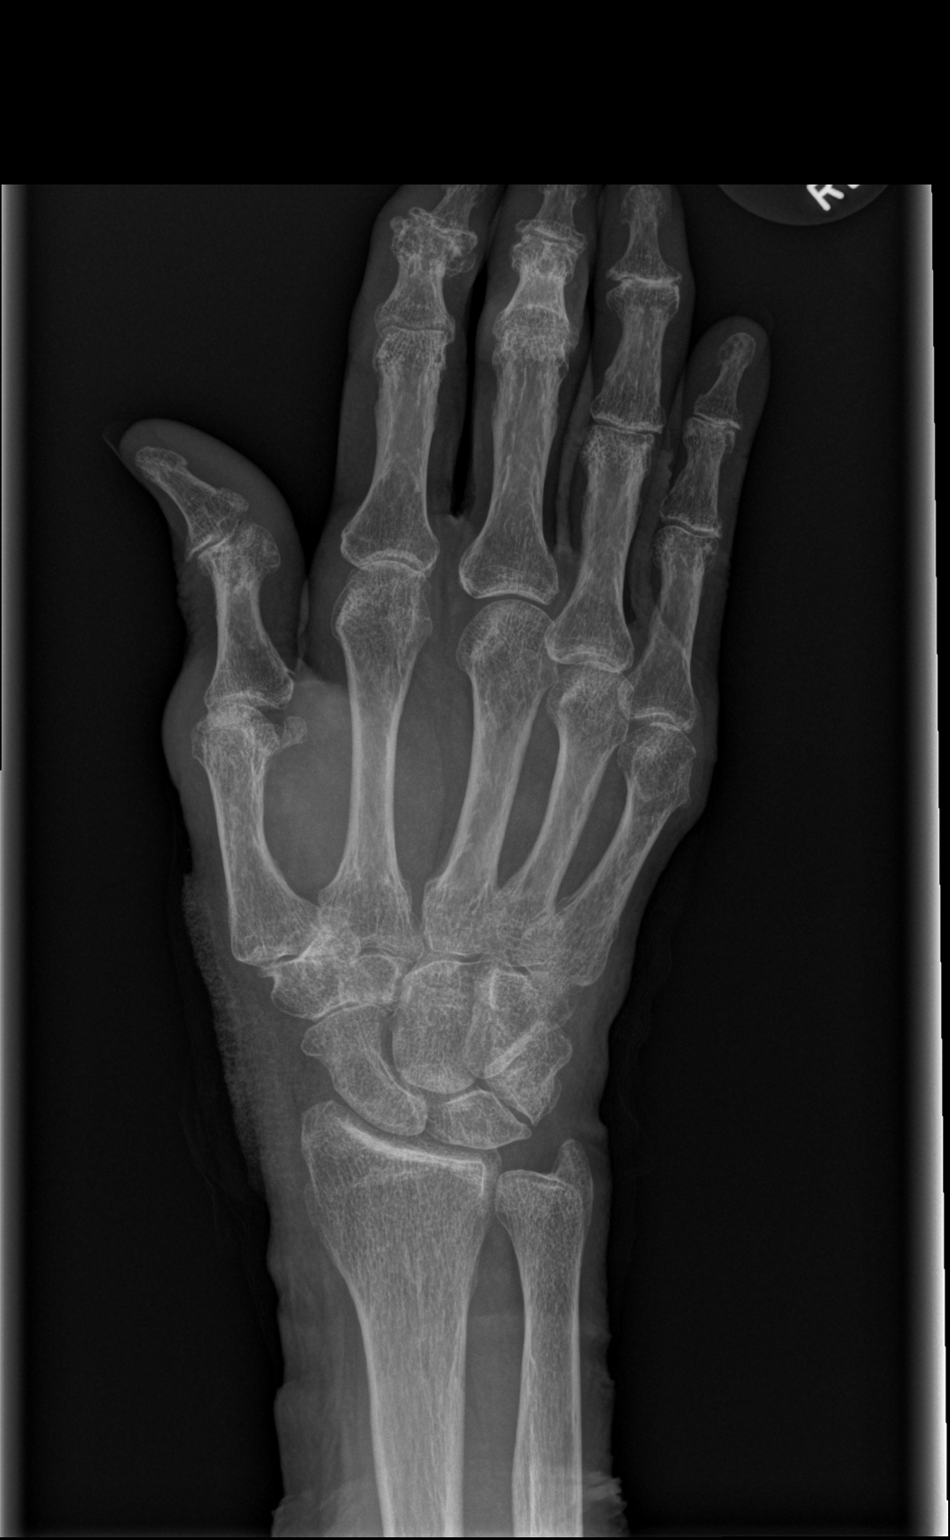

[x hand obl right]
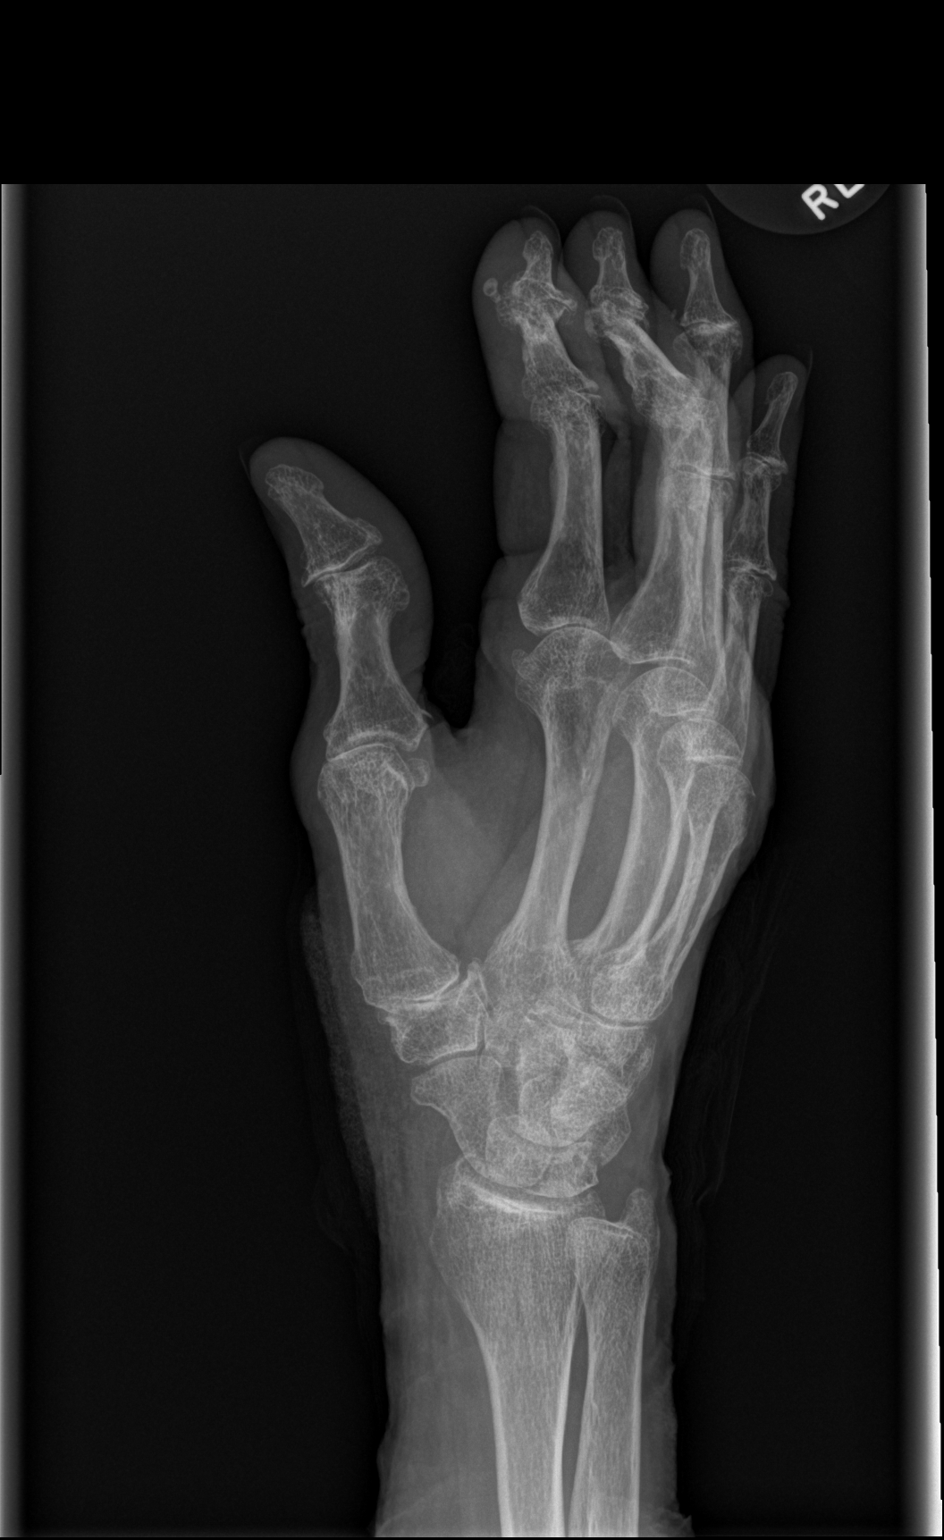

[x hand lat right]
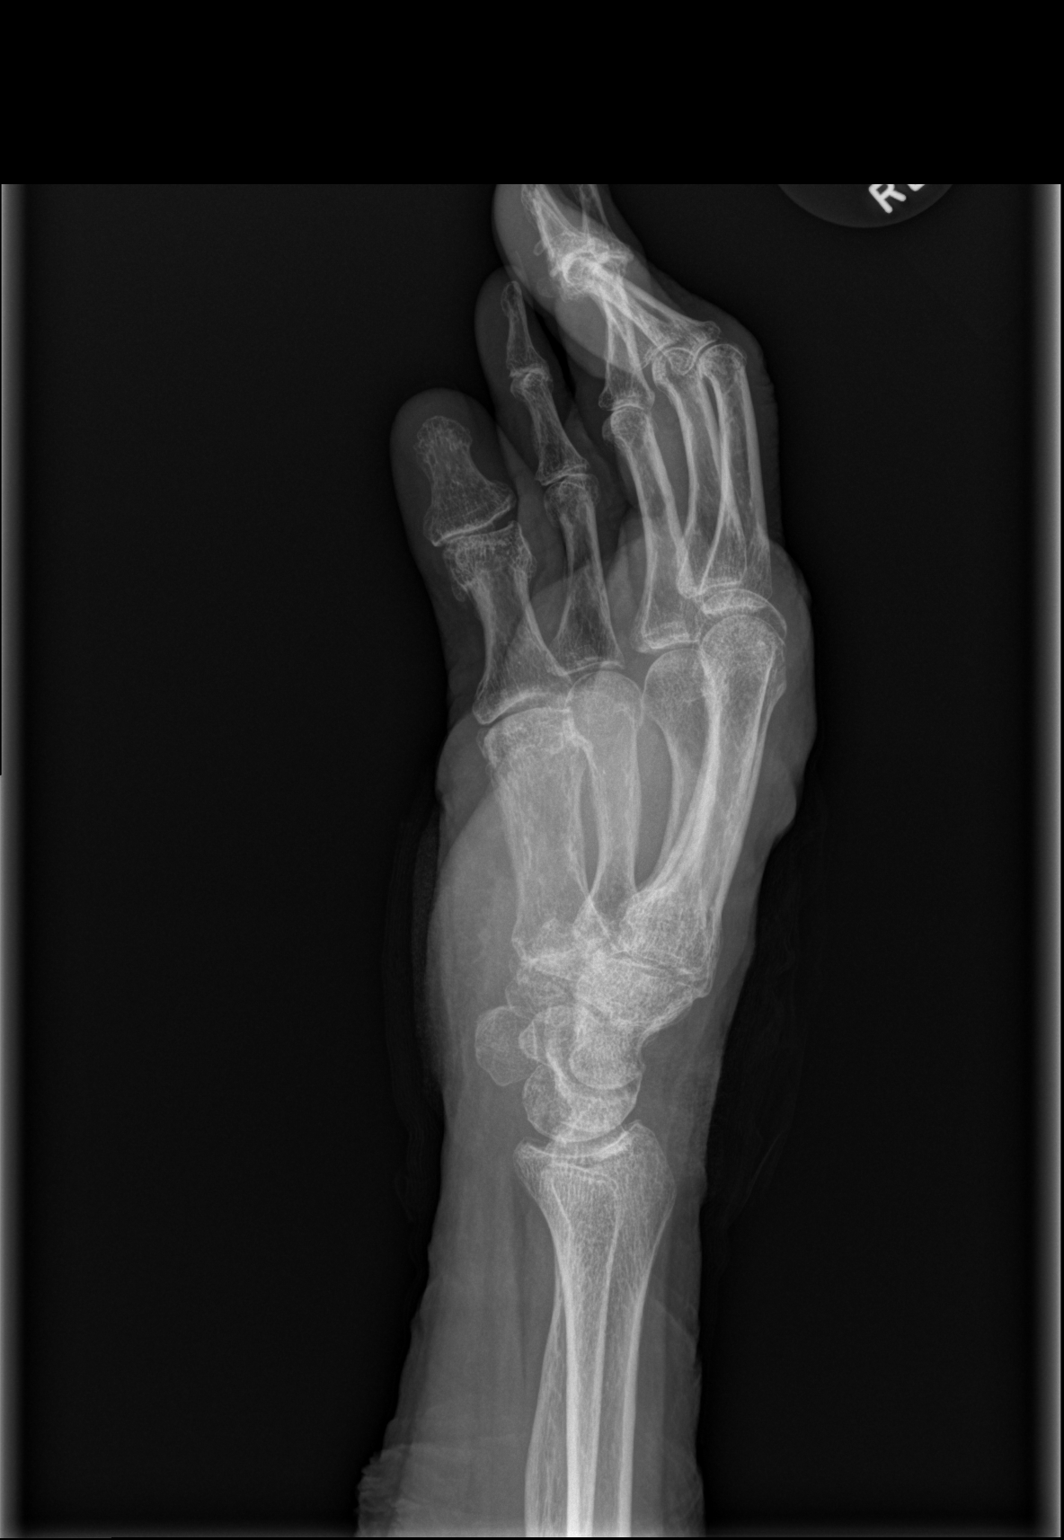

[3 of 3 positions shown; findings below may reference images not displayed]

FINDINGS: Advanced arthritis of the inter phalangeal joints. Osteoarthritis of
the first carpometacarpal joint. No evidence of acute fracture or
dislocation. No sign of radiopaque foreign object.
IMPRESSION: No acute or traumatic finding. Osteoarthritis of the interphalangeal
joints and first carpometacarpal joint.

## 2019-07-25 DIAGNOSIS — K5900 Constipation, unspecified: Secondary | ICD-10-CM | POA: Diagnosis not present

## 2019-07-25 DIAGNOSIS — Z7689 Persons encountering health services in other specified circumstances: Secondary | ICD-10-CM | POA: Diagnosis not present

## 2019-09-18 IMAGING — CR DG CHEST 1V
1 series · 1 of 1 positions shown · non-contrast
Comparison: 09/11/2016, 02/04/2007.

CLINICAL DATA: 84-year-old who fell this morning and injured the
LEFT hip. Generalized weakness. Current history of Alzheimer's
dementia.

EXAM:
CHEST  1 VIEW

[x chest ap]
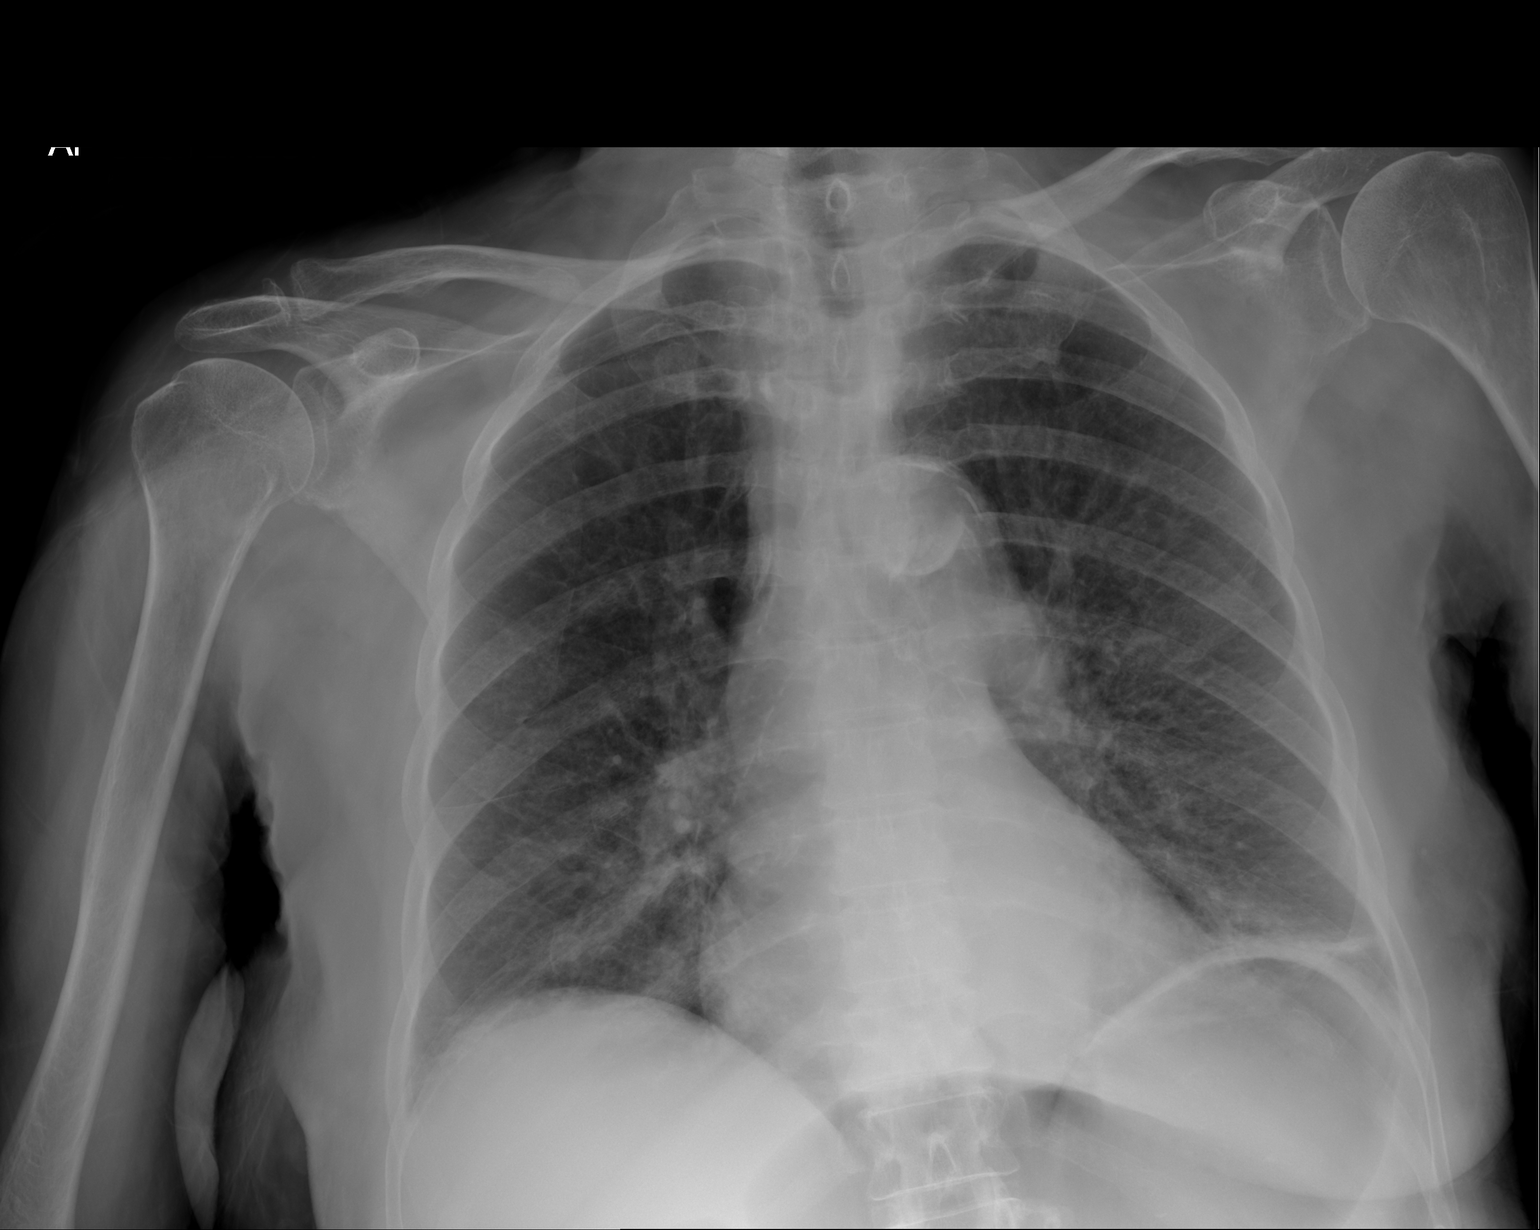

[1 of 1 positions shown; findings below may reference images not displayed]

FINDINGS: AP SEMI-ERECT view demonstrates borderline to mild cardiac
enlargement, unchanged. Thoracic aorta tortuous and atherosclerotic.
Hilar and mediastinal contours otherwise unremarkable. Stable linear
scarring at the lung bases. Diffuse interstitial pulmonary opacities
which are new since the most recent prior examination. No confluent
airspace consolidation. No visible pleural effusions.
IMPRESSION: Acute mild CHF and/or fluid overload with interstitial pulmonary
edema (favored over interstitial pneumonitis).

## 2019-09-19 IMAGING — DX DG PORTABLE PELVIS
1 series · 2 of 2 positions shown · non-contrast
Comparison: Left hip CT 09/28/2018

CLINICAL DATA: Left hip replacement

EXAM:
PORTABLE PELVIS 1-2 VIEWS

[Series 1: pelvis · 0.14mm/px · 2 of 2 slices shown]
[im 1/2]
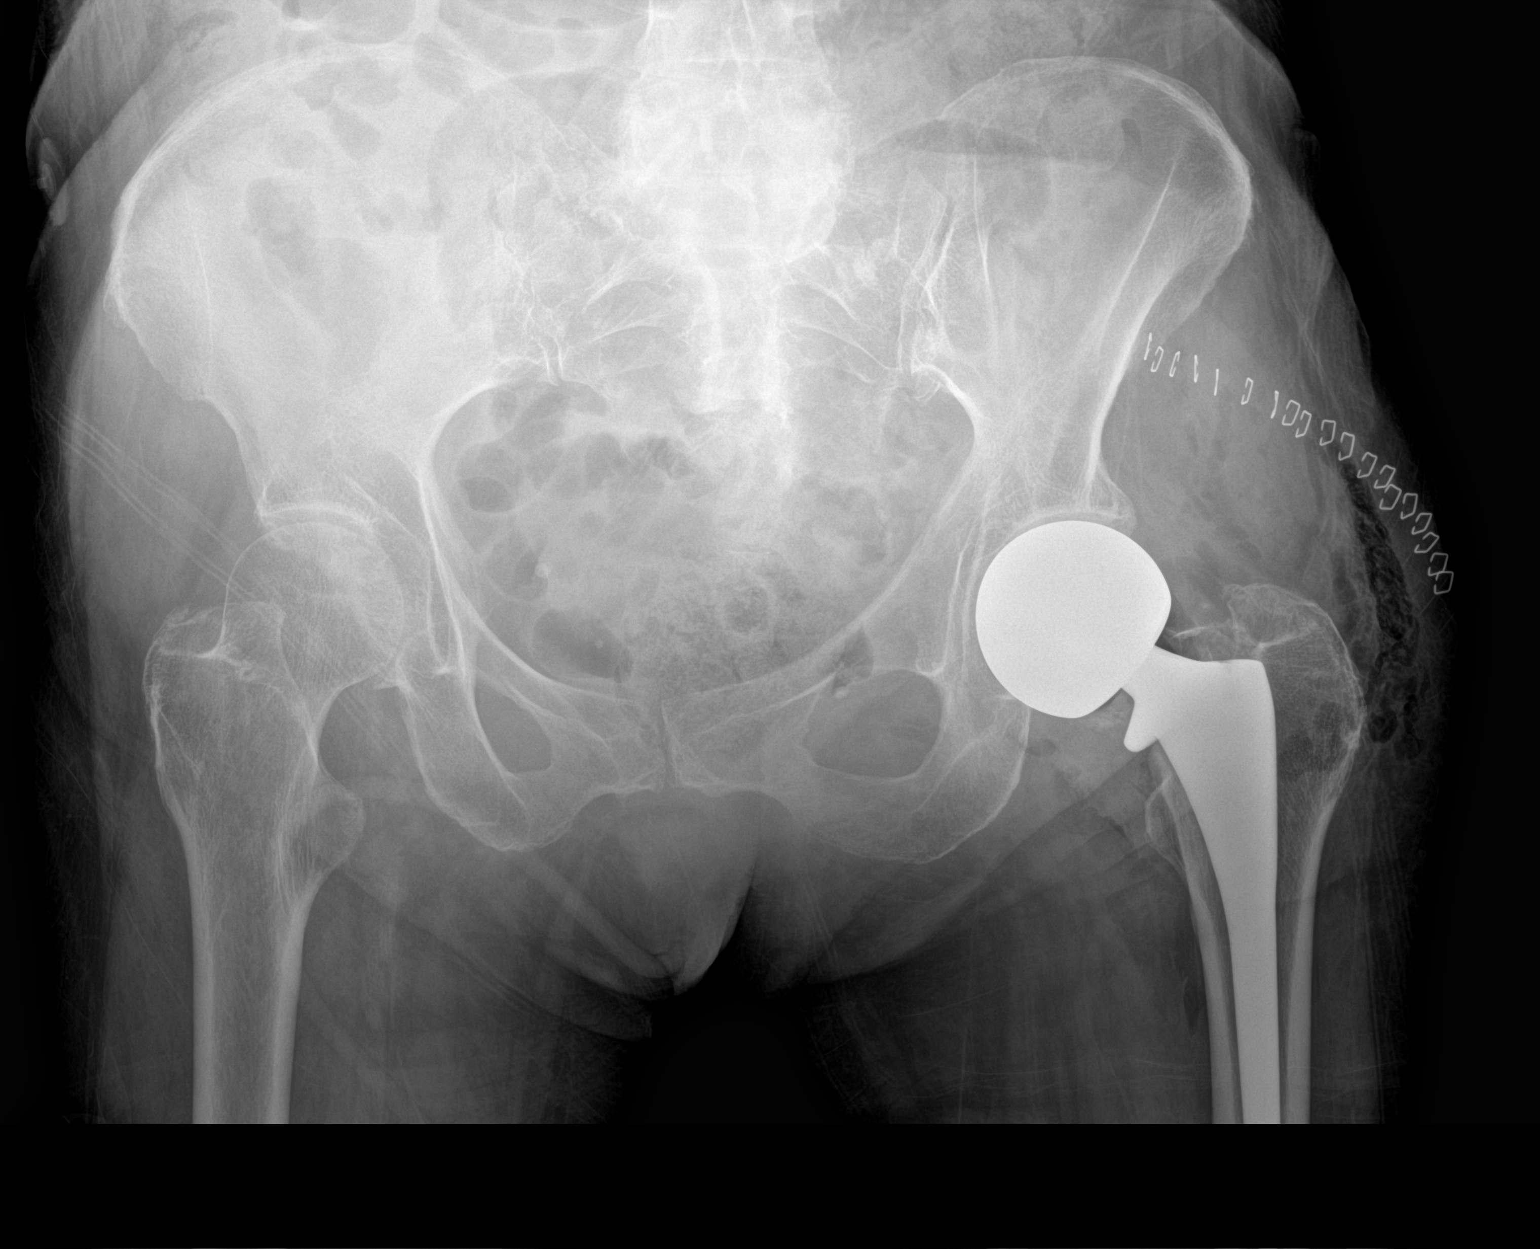
[im 2/2]
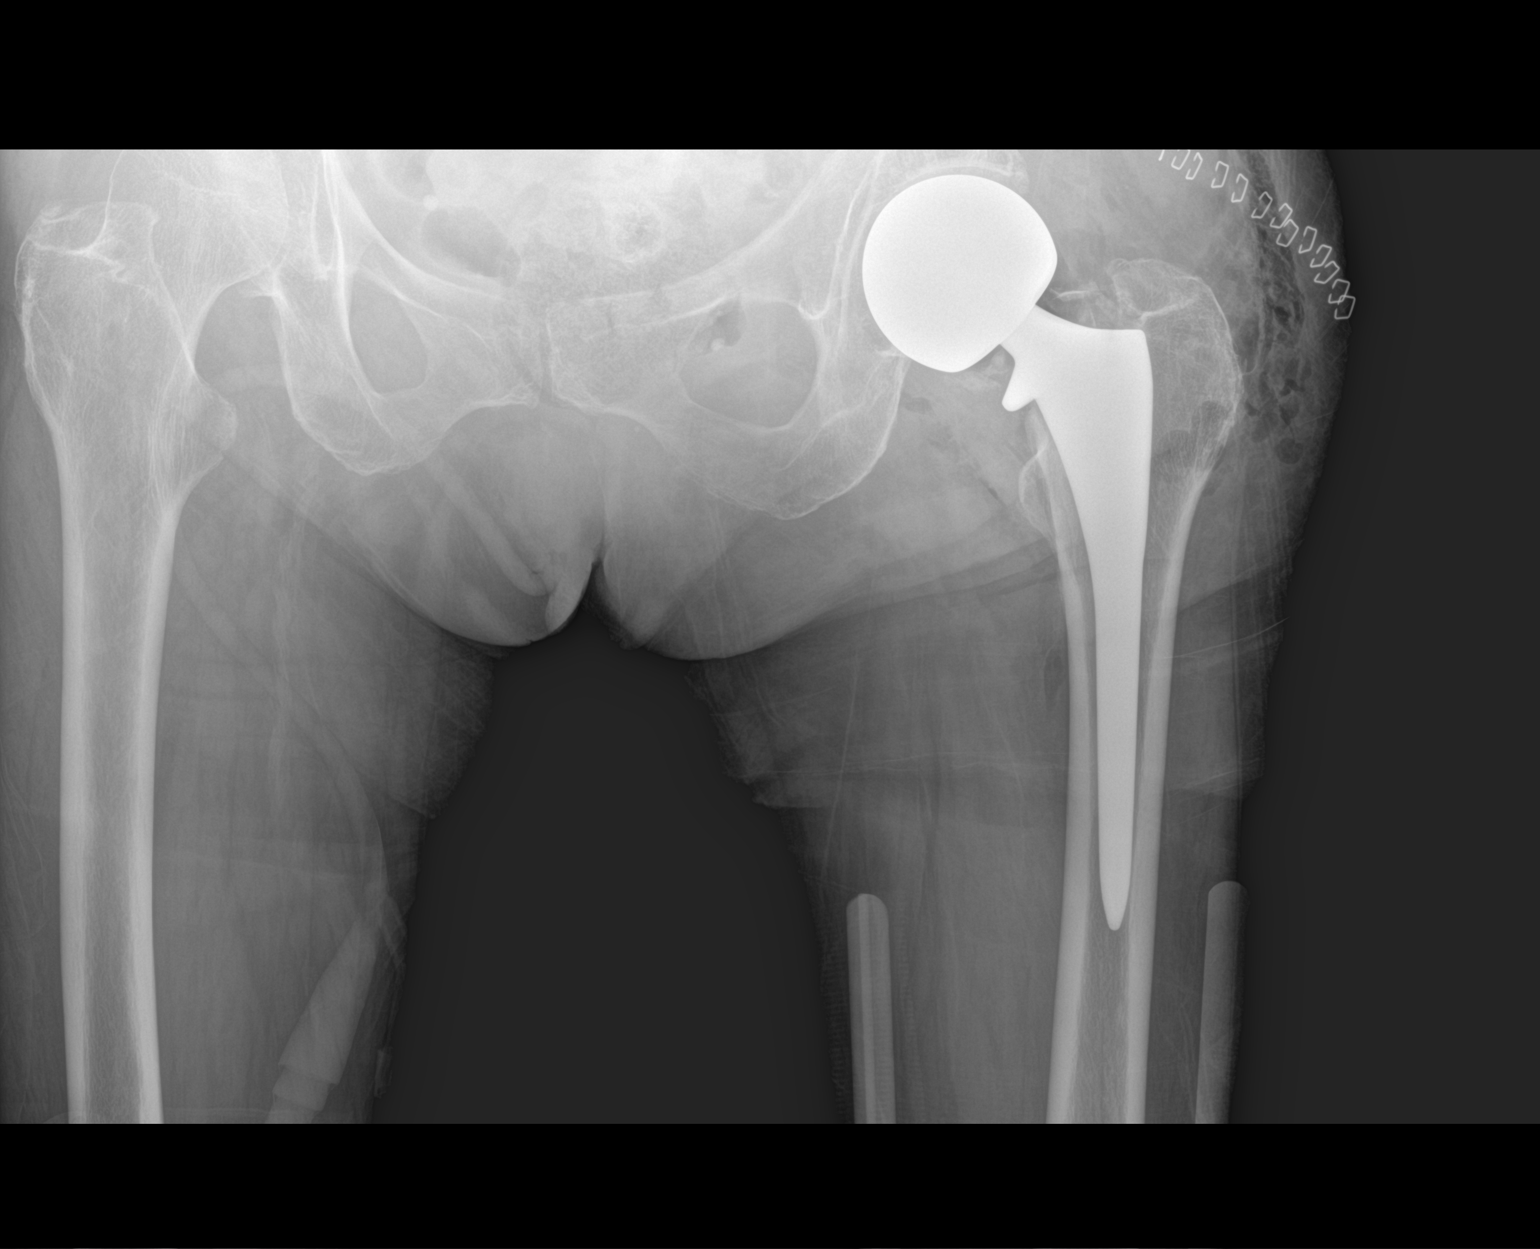

[2 of 2 positions shown; findings below may reference images not displayed]

FINDINGS: There is a left hip hemiarthroplasty now present. Alignment is
normal. There is expected postoperative gas overlying the left
greater trochanter.
IMPRESSION: Expected postoperative appearance of left hip hemiarthroplasty.

## 2019-09-20 IMAGING — DX DG CHEST 2V
2 series · 2 of 2 positions shown · non-contrast
Comparison: September 28, 2018

CLINICAL DATA: Heart failure

EXAM:
CHEST - 2 VIEW

[chest lat]
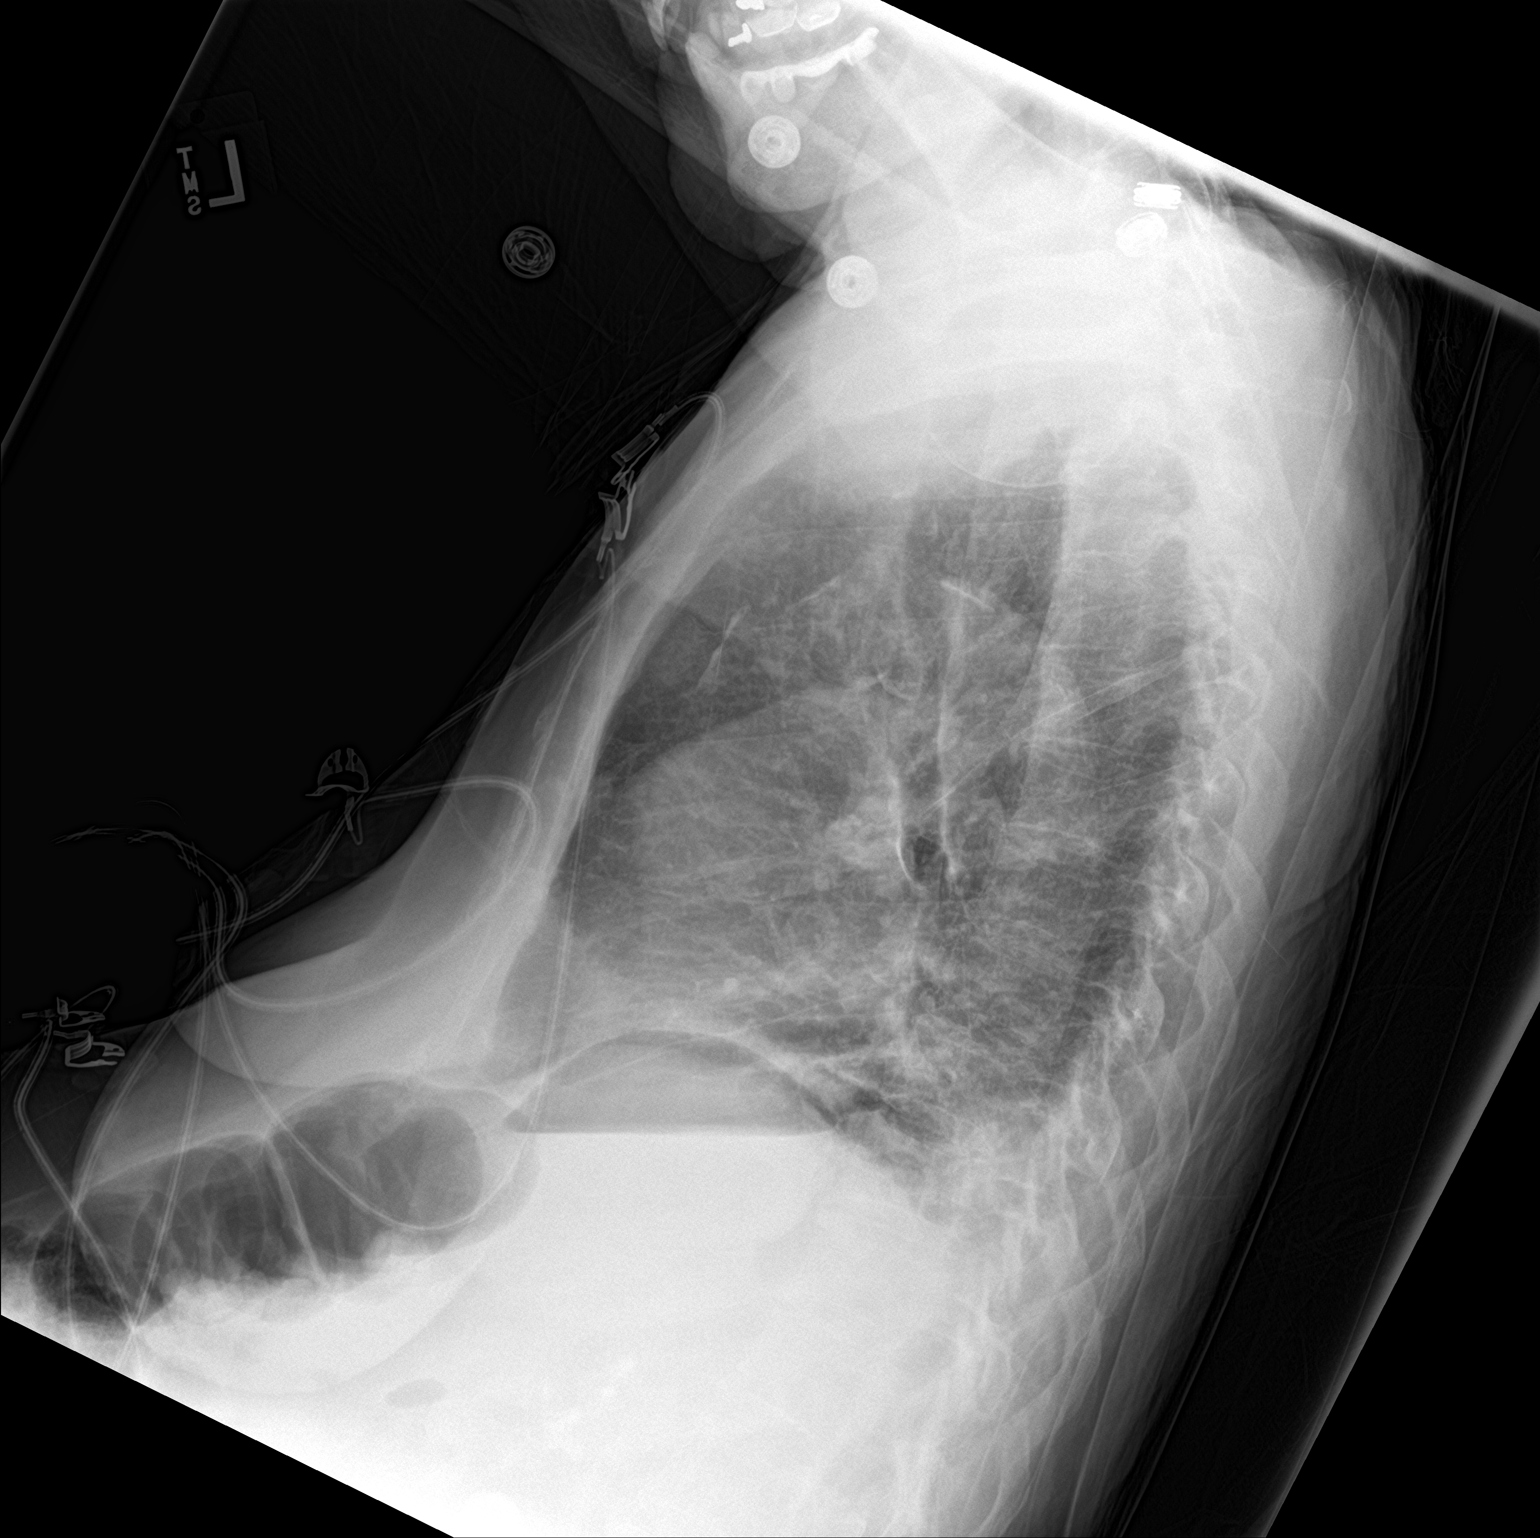

[chest ap]
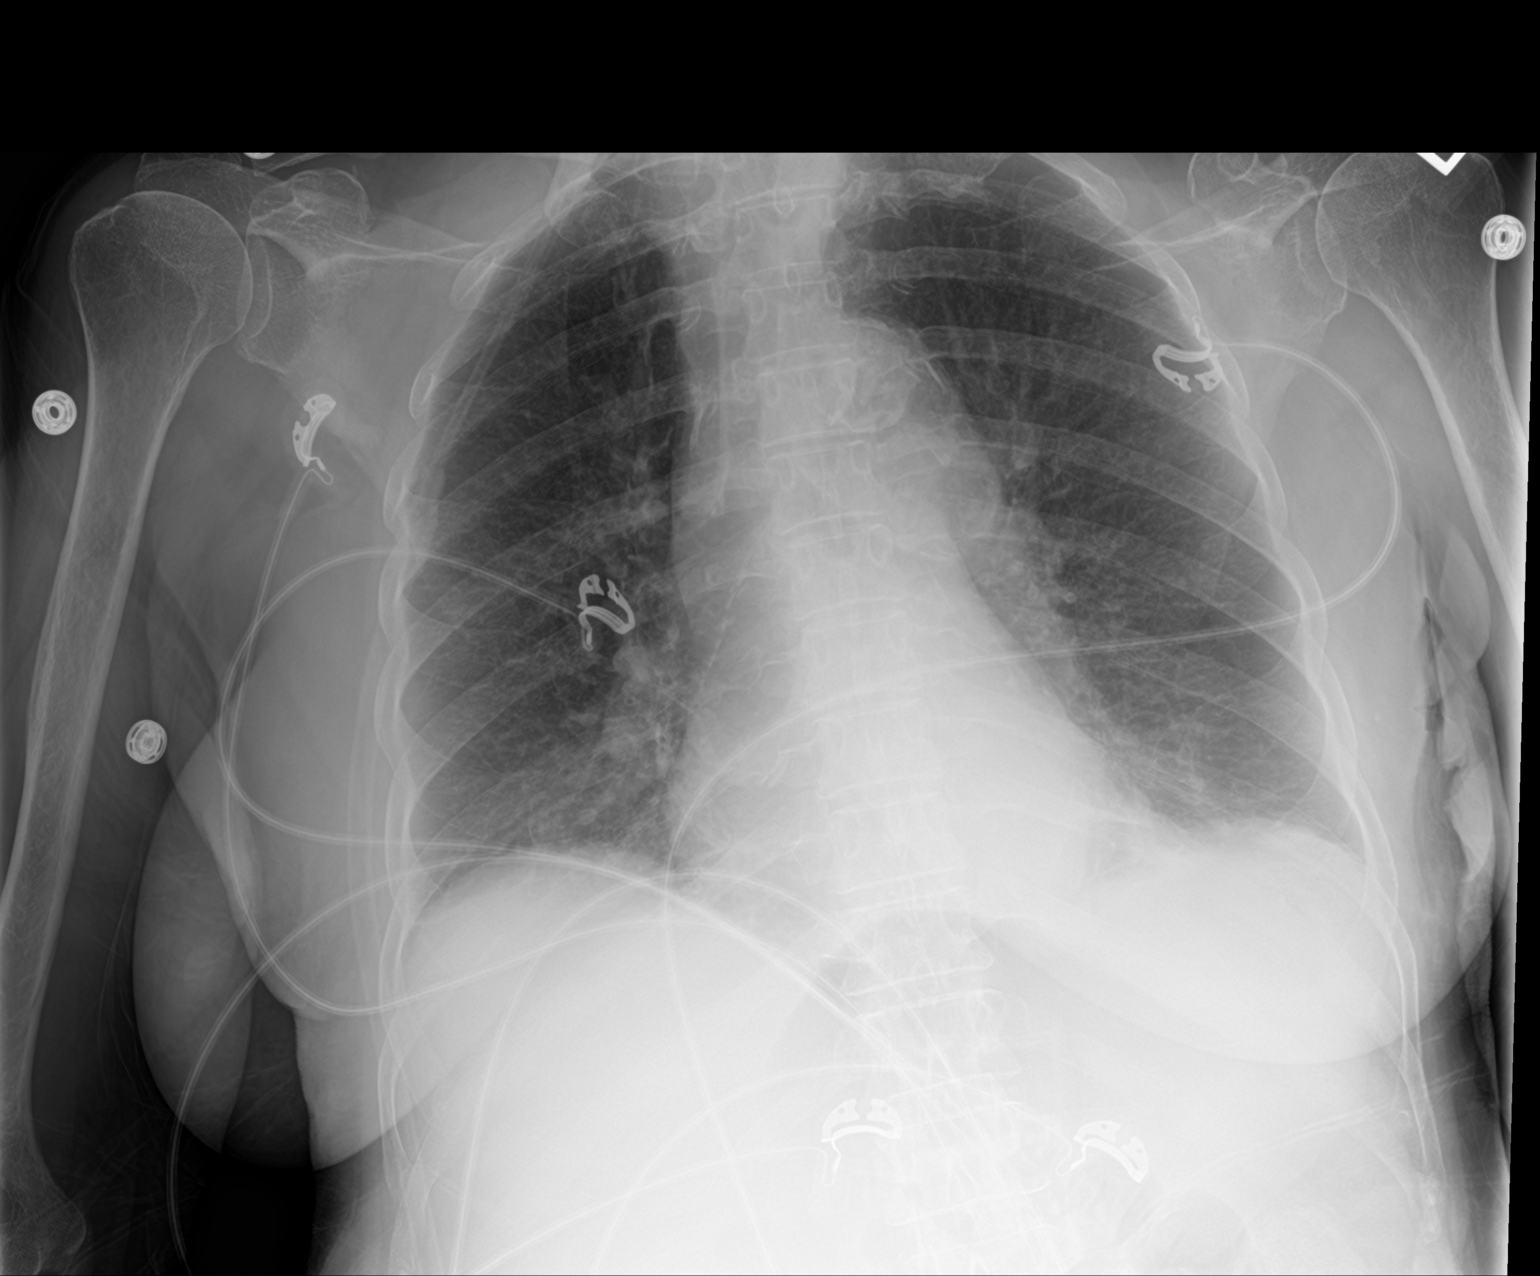

[2 of 2 positions shown; findings below may reference images not displayed]

FINDINGS: The heart size and mediastinal contours are stable. Mild bilateral
interstitial edema is identified. There is no focal pneumonia. The
visualized skeletal structures are unremarkable.
IMPRESSION: Mild bilateral interstitial edema.

## 2019-09-21 DIAGNOSIS — R7989 Other specified abnormal findings of blood chemistry: Secondary | ICD-10-CM | POA: Diagnosis not present

## 2019-09-21 DIAGNOSIS — Z5181 Encounter for therapeutic drug level monitoring: Secondary | ICD-10-CM | POA: Diagnosis not present

## 2019-09-21 DIAGNOSIS — R6889 Other general symptoms and signs: Secondary | ICD-10-CM | POA: Diagnosis not present

## 2019-10-31 DIAGNOSIS — R8290 Unspecified abnormal findings in urine: Secondary | ICD-10-CM | POA: Diagnosis not present

## 2019-11-03 DIAGNOSIS — F0390 Unspecified dementia without behavioral disturbance: Secondary | ICD-10-CM | POA: Diagnosis not present

## 2019-11-03 DIAGNOSIS — J9621 Acute and chronic respiratory failure with hypoxia: Secondary | ICD-10-CM | POA: Diagnosis not present

## 2019-11-03 DIAGNOSIS — M159 Polyosteoarthritis, unspecified: Secondary | ICD-10-CM | POA: Diagnosis not present

## 2019-11-18 ENCOUNTER — Other Ambulatory Visit: Payer: PPO | Admitting: Internal Medicine

## 2019-11-18 ENCOUNTER — Other Ambulatory Visit: Payer: Self-pay

## 2019-11-18 DIAGNOSIS — Z515 Encounter for palliative care: Secondary | ICD-10-CM

## 2019-11-18 NOTE — Progress Notes (Signed)
  Noon:   I visited patient's home for scheduled Palliative Care visit.   Patient has a new attending physician,  Dr. Donovan Kail of  [facility] [phone]).  I would need a new order to see.   However, patient's husband feels overwhelmed with all the caregivers he has in place Administrator, [employer] for aide services; nurse supervisor Stephens Shire [phone] or [phone]), [facility] [phone]) for in home RN monitoring, and Dr. Donovan Kail with [facility].   Mr. Provins defers on my seeing patient at this time. I gave him my contact information, and mentioned he could call us should his wife continue to decline, such that he might wish to consider hospice services for her end of life care.  I will discharge patient from our services, and call the [facility] manager Butch Penny.  Violeta Gelinas NP-C [phone]

## 2019-12-03 DIAGNOSIS — J9621 Acute and chronic respiratory failure with hypoxia: Secondary | ICD-10-CM | POA: Diagnosis not present

## 2019-12-03 DIAGNOSIS — F0390 Unspecified dementia without behavioral disturbance: Secondary | ICD-10-CM | POA: Diagnosis not present

## 2019-12-03 DIAGNOSIS — M159 Polyosteoarthritis, unspecified: Secondary | ICD-10-CM | POA: Diagnosis not present

## 2019-12-23 ENCOUNTER — Telehealth: Payer: Self-pay

## 2019-12-23 NOTE — Telephone Encounter (Signed)
Phone call placed to [facility] to verify if patient is under their service. Patient admitted to Hospice services on 12/12/2019. Will D/C from [facility]

## 2020-06-12 ENCOUNTER — Ambulatory Visit: Payer: PPO

## 2020-06-18 ENCOUNTER — Other Ambulatory Visit: Payer: Self-pay | Admitting: Neurology

## 2020-07-04 ENCOUNTER — Other Ambulatory Visit: Payer: Self-pay | Admitting: Neurology

## 2020-07-04 DEATH — deceased
